# Patient Record
Sex: Female | Born: 1935 | Race: White | Hispanic: No | Smoking: Never smoker
Health system: Southern US, Community
[De-identification: ages and names within clinical notes are randomized; demographics above are authoritative.]

## PROBLEM LIST (undated history)

## (undated) DIAGNOSIS — F329 Major depressive disorder, single episode, unspecified: Secondary | ICD-10-CM

## (undated) DIAGNOSIS — R0602 Shortness of breath: Secondary | ICD-10-CM

## (undated) DIAGNOSIS — F3289 Other specified depressive episodes: Secondary | ICD-10-CM

## (undated) DIAGNOSIS — M533 Sacrococcygeal disorders, not elsewhere classified: Secondary | ICD-10-CM

## (undated) DIAGNOSIS — R55 Syncope and collapse: Secondary | ICD-10-CM

## (undated) DIAGNOSIS — I10 Essential (primary) hypertension: Secondary | ICD-10-CM

## (undated) DIAGNOSIS — J449 Chronic obstructive pulmonary disease, unspecified: Secondary | ICD-10-CM

## (undated) DIAGNOSIS — N183 Chronic kidney disease, stage 3 unspecified: Secondary | ICD-10-CM

## (undated) DIAGNOSIS — I2510 Atherosclerotic heart disease of native coronary artery without angina pectoris: Secondary | ICD-10-CM

## (undated) DIAGNOSIS — Y92129 Unspecified place in nursing home as the place of occurrence of the external cause: Secondary | ICD-10-CM

## (undated) DIAGNOSIS — R269 Unspecified abnormalities of gait and mobility: Secondary | ICD-10-CM

## (undated) DIAGNOSIS — F0390 Unspecified dementia without behavioral disturbance: Secondary | ICD-10-CM

## (undated) DIAGNOSIS — W19XXXA Unspecified fall, initial encounter: Secondary | ICD-10-CM

## (undated) DIAGNOSIS — F419 Anxiety disorder, unspecified: Secondary | ICD-10-CM

## (undated) DIAGNOSIS — E1142 Type 2 diabetes mellitus with diabetic polyneuropathy: Secondary | ICD-10-CM

## (undated) DIAGNOSIS — I219 Acute myocardial infarction, unspecified: Secondary | ICD-10-CM

## (undated) DIAGNOSIS — F0150 Vascular dementia without behavioral disturbance: Secondary | ICD-10-CM

## (undated) DIAGNOSIS — E785 Hyperlipidemia, unspecified: Secondary | ICD-10-CM

## (undated) DIAGNOSIS — I679 Cerebrovascular disease, unspecified: Secondary | ICD-10-CM

## (undated) DIAGNOSIS — G629 Polyneuropathy, unspecified: Secondary | ICD-10-CM

## (undated) DIAGNOSIS — I672 Cerebral atherosclerosis: Secondary | ICD-10-CM

## (undated) DIAGNOSIS — K219 Gastro-esophageal reflux disease without esophagitis: Secondary | ICD-10-CM

## (undated) DIAGNOSIS — Z950 Presence of cardiac pacemaker: Secondary | ICD-10-CM

## (undated) DIAGNOSIS — R410 Disorientation, unspecified: Secondary | ICD-10-CM

## (undated) DIAGNOSIS — I639 Cerebral infarction, unspecified: Secondary | ICD-10-CM

## (undated) DIAGNOSIS — E1121 Type 2 diabetes mellitus with diabetic nephropathy: Secondary | ICD-10-CM

## (undated) DIAGNOSIS — J810 Acute pulmonary edema: Secondary | ICD-10-CM

## (undated) DIAGNOSIS — R569 Unspecified convulsions: Secondary | ICD-10-CM

## (undated) DIAGNOSIS — E119 Type 2 diabetes mellitus without complications: Secondary | ICD-10-CM

## (undated) DIAGNOSIS — I447 Left bundle-branch block, unspecified: Secondary | ICD-10-CM

## (undated) HISTORY — DX: Sacrococcygeal disorders, not elsewhere classified: M53.3

## (undated) HISTORY — DX: Unspecified abnormalities of gait and mobility: R26.9

## (undated) HISTORY — DX: Unspecified dementia, unspecified severity, without behavioral disturbance, psychotic disturbance, mood disturbance, and anxiety: F03.90

## (undated) HISTORY — DX: Cerebrovascular disease, unspecified: I67.9

## (undated) HISTORY — PX: TUBAL LIGATION: SHX77

## (undated) HISTORY — PX: DILATION AND CURETTAGE OF UTERUS: SHX78

## (undated) HISTORY — PX: MASTOIDECTOMY: SHX711

## (undated) HISTORY — DX: Polyneuropathy, unspecified: G62.9

## (undated) HISTORY — DX: Major depressive disorder, single episode, unspecified: F32.9

## (undated) HISTORY — PX: APPENDECTOMY: SHX54

## (undated) HISTORY — PX: CHOLECYSTECTOMY: SHX55

## (undated) HISTORY — PX: EYE SURGERY: SHX253

## (undated) HISTORY — DX: Syncope and collapse: R55

## (undated) HISTORY — PX: IRIDECTOMY: SHX1848

## (undated) HISTORY — DX: Other specified depressive episodes: F32.89

## (undated) HISTORY — DX: Type 2 diabetes mellitus with diabetic polyneuropathy: E11.42

## (undated) HISTORY — DX: Cerebral atherosclerosis: I67.2

## (undated) HISTORY — PX: CATARACT EXTRACTION W/ INTRAOCULAR LENS  IMPLANT, BILATERAL: SHX1307

## (undated) HISTORY — PX: VAGINAL HYSTERECTOMY: SUR661

## (undated) HISTORY — DX: Essential (primary) hypertension: I10

## (undated) HISTORY — DX: Atherosclerotic heart disease of native coronary artery without angina pectoris: I25.10

## (undated) HISTORY — DX: Type 2 diabetes mellitus with diabetic nephropathy: E11.21

## (undated) HISTORY — PX: CARDIAC CATHETERIZATION: SHX172

## (undated) HISTORY — DX: Chronic kidney disease, stage 3 (moderate): N18.3

## (undated) HISTORY — PX: TONSILLECTOMY AND ADENOIDECTOMY: SUR1326

## (undated) HISTORY — DX: Chronic obstructive pulmonary disease, unspecified: J44.9

## (undated) HISTORY — DX: Hyperlipidemia, unspecified: E78.5

## (undated) HISTORY — DX: Chronic kidney disease, stage 3 unspecified: N18.30

---

## 1982-12-04 HISTORY — PX: TYMPANOMASTOIDECTOMY: SHX34

## 1998-05-04 ENCOUNTER — Other Ambulatory Visit: Admission: RE | Admit: 1998-05-04 | Discharge: 1998-05-04 | Payer: Self-pay | Admitting: Internal Medicine

## 1999-08-26 ENCOUNTER — Encounter: Payer: Self-pay | Admitting: Internal Medicine

## 1999-08-26 ENCOUNTER — Ambulatory Visit (HOSPITAL_COMMUNITY): Admission: RE | Admit: 1999-08-26 | Discharge: 1999-08-26 | Payer: Self-pay | Admitting: Internal Medicine

## 2000-02-23 ENCOUNTER — Encounter: Admission: RE | Admit: 2000-02-23 | Discharge: 2000-02-23 | Payer: Self-pay | Admitting: Internal Medicine

## 2000-02-23 ENCOUNTER — Encounter: Payer: Self-pay | Admitting: Internal Medicine

## 2000-03-19 ENCOUNTER — Ambulatory Visit (HOSPITAL_COMMUNITY): Admission: RE | Admit: 2000-03-19 | Discharge: 2000-03-19 | Payer: Self-pay | Admitting: Interventional Cardiology

## 2000-04-24 ENCOUNTER — Encounter (INDEPENDENT_AMBULATORY_CARE_PROVIDER_SITE_OTHER): Payer: Self-pay | Admitting: Specialist

## 2000-04-24 ENCOUNTER — Encounter: Payer: Self-pay | Admitting: Surgery

## 2000-04-25 ENCOUNTER — Inpatient Hospital Stay (HOSPITAL_COMMUNITY): Admission: RE | Admit: 2000-04-25 | Discharge: 2000-05-03 | Payer: Self-pay | Admitting: Surgery

## 2000-04-30 ENCOUNTER — Encounter: Payer: Self-pay | Admitting: Surgery

## 2000-05-01 ENCOUNTER — Encounter: Payer: Self-pay | Admitting: Surgery

## 2000-05-07 ENCOUNTER — Ambulatory Visit (HOSPITAL_COMMUNITY): Admission: RE | Admit: 2000-05-07 | Discharge: 2000-05-07 | Payer: Self-pay | Admitting: Surgery

## 2000-05-15 ENCOUNTER — Inpatient Hospital Stay (HOSPITAL_COMMUNITY): Admission: EM | Admit: 2000-05-15 | Discharge: 2000-05-21 | Payer: Self-pay | Admitting: Emergency Medicine

## 2000-05-15 ENCOUNTER — Encounter: Payer: Self-pay | Admitting: Emergency Medicine

## 2000-05-18 ENCOUNTER — Encounter: Payer: Self-pay | Admitting: Internal Medicine

## 2000-06-26 ENCOUNTER — Ambulatory Visit (HOSPITAL_COMMUNITY): Admission: RE | Admit: 2000-06-26 | Discharge: 2000-06-26 | Payer: Self-pay | Admitting: Surgery

## 2000-06-26 ENCOUNTER — Encounter: Payer: Self-pay | Admitting: Surgery

## 2000-08-15 ENCOUNTER — Ambulatory Visit (HOSPITAL_COMMUNITY): Admission: RE | Admit: 2000-08-15 | Discharge: 2000-08-15 | Payer: Self-pay | Admitting: *Deleted

## 2000-08-15 ENCOUNTER — Encounter: Payer: Self-pay | Admitting: *Deleted

## 2000-08-20 ENCOUNTER — Ambulatory Visit (HOSPITAL_COMMUNITY): Admission: RE | Admit: 2000-08-20 | Discharge: 2000-08-20 | Payer: Self-pay | Admitting: *Deleted

## 2000-09-26 ENCOUNTER — Encounter: Payer: Self-pay | Admitting: Surgery

## 2000-09-26 ENCOUNTER — Ambulatory Visit (HOSPITAL_COMMUNITY): Admission: AD | Admit: 2000-09-26 | Discharge: 2000-09-26 | Payer: Self-pay | Admitting: Surgery

## 2000-10-15 ENCOUNTER — Encounter: Payer: Self-pay | Admitting: Surgery

## 2000-10-15 ENCOUNTER — Ambulatory Visit (HOSPITAL_COMMUNITY): Admission: RE | Admit: 2000-10-15 | Discharge: 2000-10-15 | Payer: Self-pay | Admitting: Surgery

## 2000-12-27 ENCOUNTER — Encounter: Payer: Self-pay | Admitting: Surgery

## 2000-12-27 ENCOUNTER — Ambulatory Visit (HOSPITAL_COMMUNITY): Admission: RE | Admit: 2000-12-27 | Discharge: 2000-12-27 | Payer: Self-pay | Admitting: Surgery

## 2001-01-30 ENCOUNTER — Emergency Department (HOSPITAL_COMMUNITY): Admission: EM | Admit: 2001-01-30 | Discharge: 2001-01-30 | Payer: Self-pay | Admitting: Emergency Medicine

## 2001-01-30 ENCOUNTER — Encounter: Payer: Self-pay | Admitting: General Surgery

## 2001-01-30 ENCOUNTER — Ambulatory Visit (HOSPITAL_COMMUNITY): Admission: RE | Admit: 2001-01-30 | Discharge: 2001-01-30 | Payer: Self-pay | Admitting: General Surgery

## 2001-03-07 ENCOUNTER — Encounter: Payer: Self-pay | Admitting: Surgery

## 2001-03-07 ENCOUNTER — Ambulatory Visit (HOSPITAL_COMMUNITY): Admission: RE | Admit: 2001-03-07 | Discharge: 2001-03-07 | Payer: Self-pay | Admitting: Surgery

## 2001-04-19 ENCOUNTER — Ambulatory Visit (HOSPITAL_COMMUNITY): Admission: RE | Admit: 2001-04-19 | Discharge: 2001-04-19 | Payer: Self-pay | Admitting: Surgery

## 2001-04-19 ENCOUNTER — Encounter: Payer: Self-pay | Admitting: Surgery

## 2001-04-19 ENCOUNTER — Emergency Department (HOSPITAL_COMMUNITY): Admission: EM | Admit: 2001-04-19 | Discharge: 2001-04-19 | Payer: Self-pay | Admitting: Emergency Medicine

## 2001-08-21 ENCOUNTER — Emergency Department (HOSPITAL_COMMUNITY): Admission: EM | Admit: 2001-08-21 | Discharge: 2001-08-21 | Payer: Self-pay | Admitting: Emergency Medicine

## 2001-11-08 ENCOUNTER — Encounter: Admission: RE | Admit: 2001-11-08 | Discharge: 2001-11-08 | Payer: Self-pay | Admitting: Internal Medicine

## 2001-11-08 ENCOUNTER — Encounter: Payer: Self-pay | Admitting: Internal Medicine

## 2001-11-13 ENCOUNTER — Encounter: Payer: Self-pay | Admitting: Internal Medicine

## 2001-11-13 ENCOUNTER — Encounter: Admission: RE | Admit: 2001-11-13 | Discharge: 2001-11-13 | Payer: Self-pay | Admitting: Internal Medicine

## 2001-12-04 DIAGNOSIS — I2510 Atherosclerotic heart disease of native coronary artery without angina pectoris: Secondary | ICD-10-CM

## 2001-12-04 HISTORY — DX: Atherosclerotic heart disease of native coronary artery without angina pectoris: I25.10

## 2002-05-06 ENCOUNTER — Emergency Department (HOSPITAL_COMMUNITY): Admission: EM | Admit: 2002-05-06 | Discharge: 2002-05-06 | Payer: Self-pay | Admitting: *Deleted

## 2002-06-10 ENCOUNTER — Encounter: Admission: RE | Admit: 2002-06-10 | Discharge: 2002-06-10 | Payer: Self-pay | Admitting: Internal Medicine

## 2002-06-10 ENCOUNTER — Encounter: Payer: Self-pay | Admitting: Internal Medicine

## 2002-06-16 ENCOUNTER — Encounter: Payer: Self-pay | Admitting: Internal Medicine

## 2002-06-16 ENCOUNTER — Ambulatory Visit (HOSPITAL_COMMUNITY): Admission: RE | Admit: 2002-06-16 | Discharge: 2002-06-16 | Payer: Self-pay | Admitting: Internal Medicine

## 2002-10-06 ENCOUNTER — Encounter: Payer: Self-pay | Admitting: Emergency Medicine

## 2002-10-06 ENCOUNTER — Observation Stay (HOSPITAL_COMMUNITY): Admission: EM | Admit: 2002-10-06 | Discharge: 2002-10-09 | Payer: Self-pay | Admitting: Emergency Medicine

## 2003-06-27 ENCOUNTER — Encounter: Payer: Self-pay | Admitting: Emergency Medicine

## 2003-06-27 ENCOUNTER — Inpatient Hospital Stay (HOSPITAL_COMMUNITY): Admission: EM | Admit: 2003-06-27 | Discharge: 2003-06-29 | Payer: Self-pay | Admitting: Emergency Medicine

## 2003-06-29 ENCOUNTER — Encounter: Payer: Self-pay | Admitting: Interventional Cardiology

## 2004-02-26 ENCOUNTER — Emergency Department (HOSPITAL_COMMUNITY): Admission: AD | Admit: 2004-02-26 | Discharge: 2004-02-26 | Payer: Self-pay | Admitting: Family Medicine

## 2004-06-24 ENCOUNTER — Emergency Department (HOSPITAL_COMMUNITY): Admission: EM | Admit: 2004-06-24 | Discharge: 2004-06-24 | Payer: Self-pay | Admitting: Family Medicine

## 2004-09-08 ENCOUNTER — Encounter: Admission: RE | Admit: 2004-09-08 | Discharge: 2004-09-08 | Payer: Self-pay | Admitting: Internal Medicine

## 2005-01-27 ENCOUNTER — Inpatient Hospital Stay (HOSPITAL_COMMUNITY): Admission: RE | Admit: 2005-01-27 | Discharge: 2005-01-30 | Payer: Self-pay | Admitting: Orthopedic Surgery

## 2005-08-31 ENCOUNTER — Emergency Department (HOSPITAL_COMMUNITY): Admission: EM | Admit: 2005-08-31 | Discharge: 2005-08-31 | Payer: Self-pay | Admitting: Emergency Medicine

## 2005-10-24 ENCOUNTER — Encounter: Admission: RE | Admit: 2005-10-24 | Discharge: 2005-10-24 | Payer: Self-pay | Admitting: Internal Medicine

## 2006-03-26 ENCOUNTER — Emergency Department (HOSPITAL_COMMUNITY): Admission: EM | Admit: 2006-03-26 | Discharge: 2006-03-26 | Payer: Self-pay | Admitting: Emergency Medicine

## 2006-05-27 ENCOUNTER — Inpatient Hospital Stay (HOSPITAL_COMMUNITY): Admission: EM | Admit: 2006-05-27 | Discharge: 2006-05-29 | Payer: Self-pay | Admitting: Emergency Medicine

## 2006-05-28 ENCOUNTER — Encounter: Payer: Self-pay | Admitting: Vascular Surgery

## 2006-06-23 ENCOUNTER — Inpatient Hospital Stay (HOSPITAL_COMMUNITY): Admission: EM | Admit: 2006-06-23 | Discharge: 2006-06-28 | Payer: Self-pay | Admitting: Emergency Medicine

## 2006-11-14 ENCOUNTER — Encounter: Admission: RE | Admit: 2006-11-14 | Discharge: 2006-11-14 | Payer: Self-pay | Admitting: Internal Medicine

## 2007-04-14 ENCOUNTER — Emergency Department (HOSPITAL_COMMUNITY): Admission: EM | Admit: 2007-04-14 | Discharge: 2007-04-14 | Payer: Self-pay | Admitting: Emergency Medicine

## 2007-05-29 ENCOUNTER — Encounter: Admission: RE | Admit: 2007-05-29 | Discharge: 2007-05-29 | Payer: Self-pay | Admitting: Internal Medicine

## 2007-08-25 ENCOUNTER — Inpatient Hospital Stay (HOSPITAL_COMMUNITY): Admission: EM | Admit: 2007-08-25 | Discharge: 2007-09-01 | Payer: Self-pay | Admitting: Emergency Medicine

## 2007-11-02 ENCOUNTER — Emergency Department (HOSPITAL_COMMUNITY): Admission: EM | Admit: 2007-11-02 | Discharge: 2007-11-02 | Payer: Self-pay | Admitting: Family Medicine

## 2007-12-12 ENCOUNTER — Encounter: Admission: RE | Admit: 2007-12-12 | Discharge: 2007-12-12 | Payer: Self-pay | Admitting: Internal Medicine

## 2007-12-20 ENCOUNTER — Emergency Department (HOSPITAL_COMMUNITY): Admission: EM | Admit: 2007-12-20 | Discharge: 2007-12-20 | Payer: Self-pay | Admitting: Emergency Medicine

## 2008-08-09 ENCOUNTER — Observation Stay (HOSPITAL_COMMUNITY): Admission: EM | Admit: 2008-08-09 | Discharge: 2008-08-10 | Payer: Self-pay | Admitting: Emergency Medicine

## 2009-03-17 ENCOUNTER — Encounter: Admission: RE | Admit: 2009-03-17 | Discharge: 2009-03-17 | Payer: Self-pay | Admitting: Internal Medicine

## 2009-05-31 ENCOUNTER — Emergency Department (HOSPITAL_COMMUNITY): Admission: EM | Admit: 2009-05-31 | Discharge: 2009-05-31 | Payer: Self-pay | Admitting: Emergency Medicine

## 2009-09-03 ENCOUNTER — Encounter: Admission: RE | Admit: 2009-09-03 | Discharge: 2009-09-03 | Payer: Self-pay | Admitting: Internal Medicine

## 2010-05-21 ENCOUNTER — Emergency Department (HOSPITAL_COMMUNITY): Admission: EM | Admit: 2010-05-21 | Discharge: 2010-05-22 | Payer: Self-pay | Admitting: Emergency Medicine

## 2010-05-26 ENCOUNTER — Inpatient Hospital Stay (HOSPITAL_COMMUNITY): Admission: EM | Admit: 2010-05-26 | Discharge: 2010-05-27 | Payer: Self-pay | Admitting: Emergency Medicine

## 2010-05-26 ENCOUNTER — Ambulatory Visit: Payer: Self-pay | Admitting: Vascular Surgery

## 2010-05-26 ENCOUNTER — Ambulatory Visit: Payer: Self-pay | Admitting: Internal Medicine

## 2010-05-26 ENCOUNTER — Encounter (INDEPENDENT_AMBULATORY_CARE_PROVIDER_SITE_OTHER): Payer: Self-pay | Admitting: Internal Medicine

## 2010-07-01 ENCOUNTER — Emergency Department (HOSPITAL_COMMUNITY): Admission: EM | Admit: 2010-07-01 | Discharge: 2010-07-01 | Payer: Self-pay | Admitting: Emergency Medicine

## 2010-07-09 ENCOUNTER — Inpatient Hospital Stay (HOSPITAL_COMMUNITY): Admission: EM | Admit: 2010-07-09 | Discharge: 2010-07-14 | Payer: Self-pay | Admitting: Emergency Medicine

## 2010-08-25 ENCOUNTER — Emergency Department (HOSPITAL_COMMUNITY): Admission: EM | Admit: 2010-08-25 | Discharge: 2010-08-25 | Payer: Self-pay | Admitting: Emergency Medicine

## 2010-09-21 ENCOUNTER — Emergency Department (HOSPITAL_COMMUNITY): Admission: EM | Admit: 2010-09-21 | Discharge: 2010-09-21 | Payer: Self-pay | Admitting: Emergency Medicine

## 2011-01-23 ENCOUNTER — Emergency Department (HOSPITAL_COMMUNITY): Payer: Medicare Other

## 2011-01-23 ENCOUNTER — Inpatient Hospital Stay (HOSPITAL_COMMUNITY)
Admission: EM | Admit: 2011-01-23 | Discharge: 2011-01-25 | DRG: 312 | Disposition: A | Discharge status: Nursing Home (Includes ICF) | Payer: Medicare Other | Attending: Internal Medicine | Admitting: Internal Medicine

## 2011-01-23 DIAGNOSIS — I10 Essential (primary) hypertension: Secondary | ICD-10-CM | POA: Diagnosis present

## 2011-01-23 DIAGNOSIS — I2510 Atherosclerotic heart disease of native coronary artery without angina pectoris: Secondary | ICD-10-CM | POA: Diagnosis present

## 2011-01-23 DIAGNOSIS — G40909 Epilepsy, unspecified, not intractable, without status epilepticus: Secondary | ICD-10-CM | POA: Diagnosis present

## 2011-01-23 DIAGNOSIS — Z8673 Personal history of transient ischemic attack (TIA), and cerebral infarction without residual deficits: Secondary | ICD-10-CM

## 2011-01-23 DIAGNOSIS — E871 Hypo-osmolality and hyponatremia: Secondary | ICD-10-CM | POA: Diagnosis present

## 2011-01-23 DIAGNOSIS — E1169 Type 2 diabetes mellitus with other specified complication: Secondary | ICD-10-CM | POA: Diagnosis present

## 2011-01-23 DIAGNOSIS — R55 Syncope and collapse: Principal | ICD-10-CM | POA: Diagnosis present

## 2011-01-23 DIAGNOSIS — Z7902 Long term (current) use of antithrombotics/antiplatelets: Secondary | ICD-10-CM

## 2011-01-23 DIAGNOSIS — E785 Hyperlipidemia, unspecified: Secondary | ICD-10-CM | POA: Diagnosis present

## 2011-01-23 DIAGNOSIS — D649 Anemia, unspecified: Secondary | ICD-10-CM | POA: Diagnosis present

## 2011-01-23 DIAGNOSIS — Z7982 Long term (current) use of aspirin: Secondary | ICD-10-CM

## 2011-01-23 DIAGNOSIS — A498 Other bacterial infections of unspecified site: Secondary | ICD-10-CM | POA: Diagnosis present

## 2011-01-23 DIAGNOSIS — N390 Urinary tract infection, site not specified: Secondary | ICD-10-CM | POA: Diagnosis present

## 2011-01-23 LAB — CK TOTAL AND CKMB (NOT AT ARMC)
CK, MB: 5.6 ng/mL — ABNORMAL HIGH (ref 0.3–4.0)
Total CK: 96 U/L (ref 7–177)

## 2011-01-23 LAB — DIFFERENTIAL
Basophils Absolute: 0 10*3/uL (ref 0.0–0.1)
Basophils Relative: 0 % (ref 0–1)
Monocytes Absolute: 0.6 10*3/uL (ref 0.1–1.0)
Neutro Abs: 4.5 10*3/uL (ref 1.7–7.7)
Neutrophils Relative %: 77 % (ref 43–77)

## 2011-01-23 LAB — GLUCOSE, CAPILLARY: Glucose-Capillary: 152 mg/dL — ABNORMAL HIGH (ref 70–99)

## 2011-01-23 LAB — BASIC METABOLIC PANEL
Chloride: 97 mEq/L (ref 96–112)
Creatinine, Ser: 1.35 mg/dL — ABNORMAL HIGH (ref 0.4–1.2)
GFR calc Af Amer: 46 mL/min — ABNORMAL LOW (ref >60)
GFR calc non Af Amer: 38 mL/min — ABNORMAL LOW (ref >60)

## 2011-01-23 LAB — TROPONIN I
Troponin I: 0.02 ng/mL (ref 0.00–0.06)
Troponin I: 0.02 ng/mL (ref 0.00–0.06)

## 2011-01-23 LAB — CBC
Hemoglobin: 10 g/dL — ABNORMAL LOW (ref 12.0–15.0)
MCHC: 34 g/dL (ref 30.0–36.0)
RBC: 3.09 MIL/uL — ABNORMAL LOW (ref 3.87–5.11)

## 2011-01-23 LAB — URINALYSIS, ROUTINE W REFLEX MICROSCOPIC
Ketones, ur: NEGATIVE mg/dL
Nitrite: NEGATIVE
Protein, ur: NEGATIVE mg/dL
Urobilinogen, UA: 0.2 mg/dL (ref 0.0–1.0)
pH: 6.5 (ref 5.0–8.0)

## 2011-01-23 NOTE — H&P (Signed)
NAME:  [name] [name], [name] [name] NO.:  [id number]  MEDICAL RECORD NO.:  [id number]           PATIENT TYPE:  E  LOCATION:  [facility]                         FACILITY:  [facility]  PHYSICIAN:  Talmage Nap, MD  DATE OF BIRTH:  05-Jan-1928  DATE OF ADMISSION:  01/23/2011 DATE OF DISCHARGE:                             HISTORY & PHYSICAL   PRIMARY CARE PHYSICIAN:  Candyce Churn, MD  History obtainable from the patient.  CHIEF COMPLAINT:  "I passed out at the cafeteria and I do not know what happened".  The patient is an 77 year old Caucasian female with history of CVA, no neuro deficit, seizure disorder, and coronary artery disease  was brought to the emergency room from the assisted living facility because the patient was said to have passed out while eating at the cafeteria. According to the patient for unspecified number of days she had been feeling very weak and tired and today at the cafeteria at about 12 midday while eating she passed out.  She could not recall the event that happened prior to passing out and after passing out, the patient could not recall any event that happened.  She presently denied any headaches. She denied any blurry vision.  She denied any nausea or vomiting.  No fever.  No chills.  No rigor.  No chest pain or shortness of breath. She also denied any history of abdominal discomfort.  No diarrhea or hematochezia and subsequently the patient is been admitted for further evaluation and stabilization.  PAST MEDICAL HISTORY: 1. Positive for CVA, presently no neuro deficit. 2. Hypertension. 3. Seizure disorder. 4. Coronary artery disease. 5. Type 2 diabetes mellitus. 6. Dyslipidemia. 7. Gait disorder. 8. History of fall. 9. Anemia. 10.History of carbamazepine toxicity.  PAST SURGICAL HISTORY: 1. Hysterectomy. 2. Appendectomy. 3. Open cholecystectomy. 4. Bilateral cataract surgery. 5. Tonsillectomy. 6. Amputation of the left fifth digit  at the level of the PIP     secondary to trauma.  Preadmission meds without dosages include aspirin, Benicar, carbamazepine, glyburide, isosorbide mononitrate, omeprazole, Plavix, senna, simvastatin, vitamin D.  She has no known allergies.  SOCIAL HISTORY:  Negative for alcohol or tobacco use.  She is a resident of the assisted living facility.  FAMILY HISTORY:  Noncontributory.  REVIEW OF SYSTEMS:  The patient presently denies any history of headaches.  No blurred vision.  No nausea or vomiting.  No fever.  No chills.  No rigor.  No chest pain or shortness of breath.  No cough.  No abdominal discomfort.  No diarrhea or hematochezia.  No dysuria or hematuria.  No swelling of the lower extremities.  No intolerance to heat or cold and no neuropsychiatric disorder.  PHYSICAL EXAMINATION:  GENERAL:  Elderly lady not in any obvious respiratory distress, dehydrated. VITAL SIGNS:  Blood pressure is 169/71, pulse is 55, respiratory rate is 20, temperature is 98.6. HEENT:  Pupils are reactive to light and extraocular muscles are intact. NECK:  No jugular venous distention.  No carotid bruit.  No lymphadenopathy. CHEST:  Clear to auscultation. CARDIAC:  Heart sounds are 1 and 2. ABDOMEN:  Soft,  nontender.  Liver, spleen, kidney not palpable.  Bowel sounds are positive. EXTREMITIES:  No pedal edema. NEUROLOGIC:  Nonfocal. MUSCULOSKELETAL:  Arthritic changes in the knees and in the feet. NEUROPSYCHIATRIC:  Unremarkable. SKIN:  Decreased turgor.  LABORATORY DATA:  Initial complete blood count with differential showed WBC of 5.8, hemoglobin 10.0, hematocrit of 29.4, MCV of 95.1 with a platelet count of 186 with normal differentials.  Basic metabolic panel showed sodium of 131, potassium of 4.3, chloride of 97, bicarb of 23, glucose is 166, BUN is 23, creatinine is 1.35.  First set of cardiac markers troponin-I 0.02.  Urinalysis showed trace leukocyte esterase with negative nitrite.   Urine microscopy showed wbc 3-6 with many bacteria.  EKG showed sinus rhythm with a rate of 61, left axis deviation and nonspecific T-wave changes.  Imaging study done include CT of the head without contrast which showed no acute findings.  There is remote infarction and chronic microvascular ischemic changes.  There is also mild mucositis at the maxillary sinus.  Chest x-ray essentially normal.  ADMITTING IMPRESSION: 1. Syncope. 2. Dehydration/Hyponatremia. 3. Asymptomatic bacteriuria. 4. History of cerebrovascular accident, no neuro deficit. 5. Hypertension. 6. Seizure disorder. 7. Coronary artery disease. 8. Type 2 diabetes mellitus. 9. Dyslipidemia.  PLAN:  To admit the patient to a telemetry under the services of Dr. Marden Noble, Patsi Sears.  The patient will be slowly rehydrated with normal saline IV to go at a rate of 80 mL an hour.  She will be given Rocephin 1 g IV q.24 and she will also be on Tegretol 400 mg p.o. b.i.d. for her seizure.  She will also be given Zocor 20 mg p.o. daily for her dyslipidemia and other medication to be given to the patient will include Plavix 75 mg p.o. daily, aspirin 325 mg p.o. daily.  For the diabetes mellitus, she will be on glyburide 2.5 mg p.o. daily.  Accu- Cheks t.i.d. with a.c. and nightly with regular insulin sliding scale (moderate scale).  She also will be on Effexor 75 mg p.o. daily.  Blood pressure will be controlled with Benicar 20 mg p.o. daily and Imdur 60 mg p.o. daily.  She will be on Protonix 40 mg IV q.24 h. for GI prophylaxis and Lovenox 40 mg subcu q.24 h. for DVT prophylaxis. Further labs to be ordered on this patient will include cardiac enzymes q.6 x3, blood carbamazepine or Tegretol level in a.m., EEG, hemoglobin A1c today or in a.m.  CBC, CMP and magnesium will be repeated in a.m. The patient will be followed and evaluated by Dr. Marden Noble.     Talmage Nap, MD     CN/MEDQ  D:  01/23/2011  T:   01/23/2011  Job:  [id number]  Electronically Signed by Talmage Nap  on 01/23/2011 11:30:09 PM

## 2011-01-24 ENCOUNTER — Inpatient Hospital Stay (HOSPITAL_COMMUNITY): Payer: Medicare Other

## 2011-01-24 LAB — COMPREHENSIVE METABOLIC PANEL
ALT: 20 U/L (ref 0–35)
CO2: 27 mEq/L (ref 19–32)
Calcium: 8.7 mg/dL (ref 8.4–10.5)
Chloride: 100 mEq/L (ref 96–112)
Creatinine, Ser: 1.09 mg/dL (ref 0.4–1.2)
GFR calc non Af Amer: 48 mL/min — ABNORMAL LOW (ref >60)
Glucose, Bld: 106 mg/dL — ABNORMAL HIGH (ref 70–99)
Total Bilirubin: 0.4 mg/dL (ref 0.3–1.2)

## 2011-01-24 LAB — GLUCOSE, CAPILLARY
Glucose-Capillary: 101 mg/dL — ABNORMAL HIGH (ref 70–99)
Glucose-Capillary: 69 mg/dL — ABNORMAL LOW (ref 70–99)
Glucose-Capillary: 117 mg/dL — ABNORMAL HIGH (ref 70–99)
Glucose-Capillary: 96 mg/dL (ref 70–99)

## 2011-01-24 LAB — CARDIAC PANEL(CRET KIN+CKTOT+MB+TROPI)
CK, MB: 4.5 ng/mL — ABNORMAL HIGH (ref 0.3–4.0)
Relative Index: INVALID (ref 0.0–2.5)
Troponin I: 0.02 ng/mL (ref 0.00–0.06)
Troponin I: 0.03 ng/mL (ref 0.00–0.06)

## 2011-01-24 LAB — HEMOGLOBIN A1C
Hgb A1c MFr Bld: 6.2 % — ABNORMAL HIGH (ref <5.7)
Mean Plasma Glucose: 131 mg/dL — ABNORMAL HIGH (ref <117)

## 2011-01-24 LAB — MAGNESIUM: Magnesium: 1.9 mg/dL (ref 1.5–2.5)

## 2011-01-24 LAB — CBC
MCHC: 34.1 g/dL (ref 30.0–36.0)
RDW: 12.3 % (ref 11.5–15.5)

## 2011-01-25 LAB — GLUCOSE, CAPILLARY: Glucose-Capillary: 77 mg/dL (ref 70–99)

## 2011-01-25 LAB — URINE CULTURE
Colony Count: >=100000
Culture  Setup Time: 201202210147

## 2011-01-25 NOTE — Discharge Summary (Signed)
  NAME:  [name] [name], [name] [name] NO.:  [id number]  MEDICAL RECORD NO.:  [id number]           PATIENT TYPE:  I  LOCATION:  2006                         FACILITY:  [facility]  PHYSICIAN:  Candyce Churn, M.D.DATE OF BIRTH:  Apr 28, 1928  DATE OF ADMISSION:  01/23/2011 DATE OF DISCHARGE:                              DISCHARGE SUMMARY   ADDENDUM: Discharge number [phone] should be a priority discharge summary, so the patient can be discharged back to [facility] this afternoon, January 25, 2011.  She is in room 2006.     Candyce Churn, M.D.     RNG/MEDQ  D:  01/25/2011  T:  01/25/2011  Job:  [id number]  Electronically Signed by Marden Noble M.D. on 01/25/2011 08:20:27 PM

## 2011-01-26 LAB — CARBAMAZEPINE, FREE AND TOTAL
Carbamazepine Metabolite/: 1.8 ug/mL
Carbamazepine Metabolite: 2.1 ug/mL — ABNORMAL HIGH (ref 0.1–1.0)
Carbamazepine, Bound: 6.6 ug/mL
Carbamazepine, Free: 1.8 ug/mL (ref 1.0–3.0)

## 2011-01-26 LAB — GLUCOSE, CAPILLARY: Glucose-Capillary: 167 mg/dL — ABNORMAL HIGH (ref 70–99)

## 2011-01-26 NOTE — Discharge Summary (Signed)
NAME:  [name] [name], [name] [name] NO.:  [id number]  MEDICAL RECORD NO.:  [id number]           PATIENT TYPE:  I  LOCATION:  2006                         FACILITY:  [facility]  PHYSICIAN:  Candyce Churn, M.D.DATE OF BIRTH:  12-18-1927  DATE OF ADMISSION:  01/23/2011 DATE OF DISCHARGE:  01/25/2011                              DISCHARGE SUMMARY   DISCHARGE DIAGNOSES: 1. Syncope, question etiology.  Most likely secondary to hypoglycemia. 2. History of cerebrovascular accident. 3. Hypertension. 4. Seizure disorder. 5. History of coronary artery disease. 6. Type 2 diabetes mellitus. 7. Dyslipidemia. 8. Gait disorder. 9. Anemia. 10.History of carbamazepine toxicity. 11.Possible episodes of hypoglycemia. 12.Escherichia coli urinary tract infection.  DISCHARGE MEDICATIONS: 1. Aspirin 325 mg daily. 2. Tegretol 400 mg b.i.d. 3. Plavix 75 mg daily. 4. Imdur 60 mg daily. 5. Benicar 20 mg daily. 6. Zocor 20 mg daily. 7. Effexor 75 mg daily. 8. Ciprofloxacin 250 mg b.i.d. for 7 days for E. coli urinary tract     infection.  LABORATORY DATA:  Urine culture from January 23, 2011, revealed E. coli, pansensitive.  Sensitive to ceftriaxone, ciprofloxacin, and levofloxacin.  Cardiac panel x3 while admitted, within normal limits. Magnesium 1.9.  Labs from January 24, 2011, white count 4400, hemoglobin 9.4, platelet count 184,000.  Sodium 134, potassium 4.5, chloride 100, bicarb 27, glucose 106, BUN 21, creatinine 1.09, with normal liver functions.  Hemoglobin A1c was 6.2%.  Mean plasma glucose 131.  A urinalysis from January 23, 2011, was cloudy, but otherwise just a trace of leukocytes.  There were many bacteria, only 3 to 6 white cells.  HOSPITAL COURSE:  [name] [name] is a pleasant 77 year old female with multiple medical issues, who resides very happily at [facility] in assisted living.  She was at the cafeteria on the day of admission and passed out.  She had been  feeling weak and tired all day.  She denied any other associated symptoms such as shortness of breath or chest pain or fevers or chills.  She was brought to the [facility] Emergency Room and admitted for observation.  While hospitalized, she had no significant arrhythmias. She did have 1 episode of moderate hypoglycemia with CBG of 69, and she was treated with a 15-gram carbohydrate snack.  This occurred on February 21 at 11:25 a.m.  EEG formal results are pending.  She has had no seizure activity while hospitalized.  She is eating well and alert and conversive, and recognizes me at the time of discharge.  She is feeling well.  She does report feeling weak at times and she has stopped eating simple sweets, and she is quite likely becoming hypoglycemic.  Hypoglycemia with history of seizure disorder in combination could certainly lead to syncope.  PLAN:  We will discharge back to [facility] off glipizide and on her usual medications.  We will also treat urinary tract infection with 7 days of ciprofloxacin and plan to see her back in the office in 2 weeks.  CONDITION ON DISCHARGE:  Much improved.  TIME SPENT:  Conversing with the patient, reviewing chart, laboratories, and performing discharge  summary was 35 minutes.     Candyce Churn, M.D.     RNG/MEDQ  D:  01/25/2011  T:  01/25/2011  Job:  [id number]  Electronically Signed by Marden Noble M.D. on 01/26/2011 05:18:39 AM

## 2011-02-02 NOTE — Procedures (Signed)
EEG NUMBER:  [id number].  REFERRING PHYSICIAN:  Candyce Churn, MD  CLINICAL HISTORY:  An 77 year old patient being evaluated for syncopal episode versus seizure.  MEDICATION LISTED:  Plavix, Effexor, Benicar, Imdur, Tegretol, Zocor, and Rocephin.  There is a portable EEG recorded with the patient awake and drowsy using 17-channel machine and standard 10/20 electrode placement.  Background awake rhythm consists of 7-8 Hz theta which is of moderate amplitude synchronous reactive to eye-opening and closure.  No paroxysmal epileptiform activity, spikes or sharp waves are noted. Stages of drowsiness only are achieved and definite sleep stages are not seen.  Study is slightly suboptimal due to excessive muscle artifacts. EKG tracing reveals regular sinus rhythm.  Length of the recording is 21.5 minutes.  Hyperventilation not performed.  Photic stimulation is unremarkable.  IMPRESSION:  This EEG is abnormal due to presence of mild bihemispheric dysfunction, this is a nonspecific finding seen in a variety of toxic metabolic hypoxic and degenerative etiologies.  No definite epileptiform features were noted.     Pramod P. Pearlean Brownie, MD    BJY:NWGN D:  01/24/2011 17:03:49  T:  01/24/2011 21:21:34  Job #:  [id number]  cc:   Candyce Churn, M.D. Fax: [phone]

## 2011-02-09 ENCOUNTER — Ambulatory Visit (HOSPITAL_COMMUNITY)
Admission: EM | Admit: 2011-02-09 | Discharge: 2011-02-13 | Disposition: A | Discharge status: Nursing Home (Includes ICF) | Payer: Medicare Other | Attending: Internal Medicine | Admitting: Internal Medicine

## 2011-02-09 ENCOUNTER — Emergency Department (HOSPITAL_COMMUNITY): Payer: Medicare Other

## 2011-02-09 DIAGNOSIS — E119 Type 2 diabetes mellitus without complications: Secondary | ICD-10-CM | POA: Insufficient documentation

## 2011-02-09 DIAGNOSIS — G40802 Other epilepsy, not intractable, without status epilepticus: Secondary | ICD-10-CM | POA: Insufficient documentation

## 2011-02-09 DIAGNOSIS — R51 Headache: Secondary | ICD-10-CM | POA: Insufficient documentation

## 2011-02-09 DIAGNOSIS — E785 Hyperlipidemia, unspecified: Secondary | ICD-10-CM | POA: Insufficient documentation

## 2011-02-09 DIAGNOSIS — S0083XA Contusion of other part of head, initial encounter: Secondary | ICD-10-CM | POA: Insufficient documentation

## 2011-02-09 DIAGNOSIS — S0003XA Contusion of scalp, initial encounter: Secondary | ICD-10-CM | POA: Insufficient documentation

## 2011-02-09 DIAGNOSIS — R32 Unspecified urinary incontinence: Secondary | ICD-10-CM | POA: Insufficient documentation

## 2011-02-09 DIAGNOSIS — I5030 Unspecified diastolic (congestive) heart failure: Secondary | ICD-10-CM | POA: Insufficient documentation

## 2011-02-09 DIAGNOSIS — R296 Repeated falls: Secondary | ICD-10-CM | POA: Insufficient documentation

## 2011-02-09 DIAGNOSIS — Y998 Other external cause status: Secondary | ICD-10-CM | POA: Insufficient documentation

## 2011-02-09 DIAGNOSIS — I447 Left bundle-branch block, unspecified: Secondary | ICD-10-CM | POA: Insufficient documentation

## 2011-02-09 DIAGNOSIS — Z7982 Long term (current) use of aspirin: Secondary | ICD-10-CM | POA: Insufficient documentation

## 2011-02-09 DIAGNOSIS — I509 Heart failure, unspecified: Secondary | ICD-10-CM | POA: Insufficient documentation

## 2011-02-09 DIAGNOSIS — D649 Anemia, unspecified: Secondary | ICD-10-CM | POA: Insufficient documentation

## 2011-02-09 DIAGNOSIS — Y9210 Unspecified residential institution as the place of occurrence of the external cause: Secondary | ICD-10-CM | POA: Insufficient documentation

## 2011-02-09 DIAGNOSIS — Z8673 Personal history of transient ischemic attack (TIA), and cerebral infarction without residual deficits: Secondary | ICD-10-CM | POA: Insufficient documentation

## 2011-02-09 DIAGNOSIS — Z79899 Other long term (current) drug therapy: Secondary | ICD-10-CM | POA: Insufficient documentation

## 2011-02-09 LAB — CK TOTAL AND CKMB (NOT AT ARMC)
CK, MB: 9.3 ng/mL (ref 0.3–4.0)
Relative Index: 6.3 — ABNORMAL HIGH (ref 0.0–2.5)

## 2011-02-09 LAB — URINALYSIS, ROUTINE W REFLEX MICROSCOPIC
Glucose, UA: NEGATIVE mg/dL
Nitrite: NEGATIVE
Specific Gravity, Urine: 1.011 (ref 1.005–1.030)
pH: 7 (ref 5.0–8.0)

## 2011-02-09 LAB — GLUCOSE, CAPILLARY: Glucose-Capillary: 200 mg/dL — ABNORMAL HIGH (ref 70–99)

## 2011-02-09 LAB — POCT I-STAT, CHEM 8
BUN: 25 mg/dL — ABNORMAL HIGH (ref 6–23)
Calcium, Ion: 1.05 mmol/L — ABNORMAL LOW (ref 1.12–1.32)
Chloride: 101 mEq/L (ref 96–112)
Creatinine, Ser: 1.1 mg/dL (ref 0.4–1.2)
Glucose, Bld: 156 mg/dL — ABNORMAL HIGH (ref 70–99)
TCO2: 21 mmol/L (ref 0–100)

## 2011-02-09 LAB — CARDIAC PANEL(CRET KIN+CKTOT+MB+TROPI)
CK, MB: 6.4 ng/mL (ref 0.3–4.0)
Relative Index: 6.2 — ABNORMAL HIGH (ref 0.0–2.5)
Total CK: 103 U/L (ref 7–177)
Troponin I: 0.04 ng/mL (ref 0.00–0.06)

## 2011-02-09 LAB — CBC
Hemoglobin: 10.2 g/dL — ABNORMAL LOW (ref 12.0–15.0)
MCV: 94.6 fL (ref 78.0–100.0)
Platelets: 225 10*3/uL (ref 150–400)
RBC: 3.15 MIL/uL — ABNORMAL LOW (ref 3.87–5.11)
WBC: 8 10*3/uL (ref 4.0–10.5)

## 2011-02-09 LAB — POCT CARDIAC MARKERS: Troponin i, poc: <0.05 ng/mL (ref 0.00–0.09)

## 2011-02-09 LAB — DIFFERENTIAL
Eosinophils Absolute: 0.1 10*3/uL (ref 0.0–0.7)
Lymphs Abs: 1 10*3/uL (ref 0.7–4.0)
Neutro Abs: 6.2 10*3/uL (ref 1.7–7.7)
Neutrophils Relative %: 77 % (ref 43–77)

## 2011-02-10 LAB — BASIC METABOLIC PANEL
CO2: 24 mEq/L (ref 19–32)
Calcium: 8.4 mg/dL (ref 8.4–10.5)
Chloride: 102 mEq/L (ref 96–112)
Creatinine, Ser: 1.01 mg/dL (ref 0.4–1.2)
GFR calc Af Amer: >60 mL/min (ref >60)
Glucose, Bld: 151 mg/dL — ABNORMAL HIGH (ref 70–99)

## 2011-02-10 LAB — T4, FREE: Free T4: 0.78 ng/dL — ABNORMAL LOW (ref 0.80–1.80)

## 2011-02-10 LAB — CBC
HCT: 27.4 % — ABNORMAL LOW (ref 36.0–46.0)
Hemoglobin: 9.4 g/dL — ABNORMAL LOW (ref 12.0–15.0)
MCH: 32.6 pg (ref 26.0–34.0)
MCHC: 34.3 g/dL (ref 30.0–36.0)
MCV: 95.1 fL (ref 78.0–100.0)
RBC: 2.88 MIL/uL — ABNORMAL LOW (ref 3.87–5.11)

## 2011-02-10 LAB — CARDIAC PANEL(CRET KIN+CKTOT+MB+TROPI): Relative Index: INVALID (ref 0.0–2.5)

## 2011-02-10 LAB — DIFFERENTIAL
Basophils Relative: 0 % (ref 0–1)
Lymphocytes Relative: 25 % (ref 12–46)
Lymphs Abs: 1.6 10*3/uL (ref 0.7–4.0)
Monocytes Absolute: 0.7 10*3/uL (ref 0.1–1.0)
Monocytes Relative: 11 % (ref 3–12)
Neutro Abs: 4.1 10*3/uL (ref 1.7–7.7)
Neutrophils Relative %: 62 % (ref 43–77)

## 2011-02-10 LAB — LIPID PANEL
Cholesterol: 163 mg/dL (ref 0–200)
LDL Cholesterol: 86 mg/dL (ref 0–99)
Total CHOL/HDL Ratio: 2.7 RATIO
Triglycerides: 79 mg/dL (ref <150)

## 2011-02-10 LAB — VITAMIN B12: Vitamin B-12: 271 pg/mL (ref 211–911)

## 2011-02-10 LAB — T3, FREE: T3, Free: 2.3 pg/mL (ref 2.3–4.2)

## 2011-02-10 LAB — GLUCOSE, CAPILLARY: Glucose-Capillary: 149 mg/dL — ABNORMAL HIGH (ref 70–99)

## 2011-02-10 LAB — FERRITIN: Ferritin: 56 ng/mL (ref 10–291)

## 2011-02-11 LAB — GLUCOSE, CAPILLARY
Glucose-Capillary: 139 mg/dL — ABNORMAL HIGH (ref 70–99)
Glucose-Capillary: 166 mg/dL — ABNORMAL HIGH (ref 70–99)

## 2011-02-12 LAB — GLUCOSE, CAPILLARY
Glucose-Capillary: 150 mg/dL — ABNORMAL HIGH (ref 70–99)
Glucose-Capillary: 151 mg/dL — ABNORMAL HIGH (ref 70–99)

## 2011-02-13 LAB — GLUCOSE, CAPILLARY
Glucose-Capillary: 142 mg/dL — ABNORMAL HIGH (ref 70–99)
Glucose-Capillary: 139 mg/dL — ABNORMAL HIGH (ref 70–99)

## 2011-02-15 LAB — DIFFERENTIAL
Basophils Absolute: 0 10*3/uL (ref 0.0–0.1)
Basophils Relative: 0 % (ref 0–1)
Lymphocytes Relative: 23 % (ref 12–46)
Neutro Abs: 3.9 10*3/uL (ref 1.7–7.7)
Neutrophils Relative %: 69 % (ref 43–77)

## 2011-02-15 LAB — URINALYSIS, ROUTINE W REFLEX MICROSCOPIC
Bilirubin Urine: NEGATIVE
Hgb urine dipstick: NEGATIVE
Ketones, ur: NEGATIVE mg/dL
Protein, ur: NEGATIVE mg/dL
Urobilinogen, UA: 0.2 mg/dL (ref 0.0–1.0)

## 2011-02-15 LAB — POCT I-STAT, CHEM 8
BUN: 40 mg/dL — ABNORMAL HIGH (ref 6–23)
Chloride: 101 mEq/L (ref 96–112)
HCT: 31 % — ABNORMAL LOW (ref 36.0–46.0)
Potassium: 5.5 mEq/L — ABNORMAL HIGH (ref 3.5–5.1)

## 2011-02-15 LAB — COMPREHENSIVE METABOLIC PANEL
Albumin: 3.7 g/dL (ref 3.5–5.2)
Alkaline Phosphatase: 73 U/L (ref 39–117)
BUN: 24 mg/dL — ABNORMAL HIGH (ref 6–23)
Chloride: 98 mEq/L (ref 96–112)
Creatinine, Ser: 1.37 mg/dL — ABNORMAL HIGH (ref 0.4–1.2)
Glucose, Bld: 114 mg/dL — ABNORMAL HIGH (ref 70–99)
Potassium: 4.6 mEq/L (ref 3.5–5.1)
Total Bilirubin: 0.5 mg/dL (ref 0.3–1.2)
Total Protein: 6.9 g/dL (ref 6.0–8.3)

## 2011-02-15 LAB — POCT CARDIAC MARKERS
CKMB, poc: <1 ng/mL — ABNORMAL LOW (ref 1.0–8.0)
Myoglobin, poc: 144 ng/mL (ref 12–200)
Troponin i, poc: <0.05 ng/mL (ref 0.00–0.09)

## 2011-02-15 LAB — CBC
HCT: 29.9 % — ABNORMAL LOW (ref 36.0–46.0)
MCH: 33.2 pg (ref 26.0–34.0)
MCV: 95.5 fL (ref 78.0–100.0)
Platelets: 203 10*3/uL (ref 150–400)
RDW: 12.7 % (ref 11.5–15.5)
WBC: 5.6 10*3/uL (ref 4.0–10.5)

## 2011-02-15 LAB — URINE CULTURE: Culture: NO GROWTH

## 2011-02-15 LAB — GLUCOSE, CAPILLARY: Glucose-Capillary: 90 mg/dL (ref 70–99)

## 2011-02-17 LAB — BASIC METABOLIC PANEL
BUN: 28 mg/dL — ABNORMAL HIGH (ref 6–23)
Chloride: 107 mEq/L (ref 96–112)
Glucose, Bld: 82 mg/dL (ref 70–99)
Potassium: 4.4 mEq/L (ref 3.5–5.1)

## 2011-02-17 LAB — LIPID PANEL
HDL: 47 mg/dL (ref >39)
Total CHOL/HDL Ratio: 2.9 RATIO
Triglycerides: 103 mg/dL (ref <150)

## 2011-02-17 LAB — HEMOGLOBIN A1C: Mean Plasma Glucose: 143 mg/dL — ABNORMAL HIGH (ref <117)

## 2011-02-17 LAB — URINALYSIS, ROUTINE W REFLEX MICROSCOPIC
Bilirubin Urine: NEGATIVE
Nitrite: NEGATIVE
Specific Gravity, Urine: 1.02 (ref 1.005–1.030)
Urobilinogen, UA: 0.2 mg/dL (ref 0.0–1.0)

## 2011-02-17 LAB — CBC
HCT: 31.9 % — ABNORMAL LOW (ref 36.0–46.0)
Hemoglobin: 10 g/dL — ABNORMAL LOW (ref 12.0–15.0)
Hemoglobin: 10.3 g/dL — ABNORMAL LOW (ref 12.0–15.0)
MCH: 31.9 pg (ref 26.0–34.0)
MCHC: 32.3 g/dL (ref 30.0–36.0)
MCHC: 32.4 g/dL (ref 30.0–36.0)
MCV: 98.8 fL (ref 78.0–100.0)
RBC: 3.23 MIL/uL — ABNORMAL LOW (ref 3.87–5.11)
RDW: 12.3 % (ref 11.5–15.5)
WBC: 6.7 10*3/uL (ref 4.0–10.5)

## 2011-02-17 LAB — GLUCOSE, CAPILLARY
Glucose-Capillary: 100 mg/dL — ABNORMAL HIGH (ref 70–99)
Glucose-Capillary: 137 mg/dL — ABNORMAL HIGH (ref 70–99)
Glucose-Capillary: 148 mg/dL — ABNORMAL HIGH (ref 70–99)
Glucose-Capillary: 174 mg/dL — ABNORMAL HIGH (ref 70–99)
Glucose-Capillary: 76 mg/dL (ref 70–99)
Glucose-Capillary: 81 mg/dL (ref 70–99)
Glucose-Capillary: 87 mg/dL (ref 70–99)
Glucose-Capillary: 88 mg/dL (ref 70–99)
Glucose-Capillary: 90 mg/dL (ref 70–99)
Glucose-Capillary: 94 mg/dL (ref 70–99)

## 2011-02-17 LAB — URINE CULTURE: Culture  Setup Time: 201108071207

## 2011-02-17 LAB — DIFFERENTIAL
Basophils Relative: 1 % (ref 0–1)
Eosinophils Absolute: 0 10*3/uL (ref 0.0–0.7)
Eosinophils Absolute: 0.1 10*3/uL (ref 0.0–0.7)
Eosinophils Relative: 0 % (ref 0–5)
Lymphs Abs: 1.2 10*3/uL (ref 0.7–4.0)
Lymphs Abs: 1.8 10*3/uL (ref 0.7–4.0)
Monocytes Absolute: 0.5 10*3/uL (ref 0.1–1.0)
Monocytes Absolute: 0.6 10*3/uL (ref 0.1–1.0)
Monocytes Relative: 11 % (ref 3–12)
Monocytes Relative: 8 % (ref 3–12)

## 2011-02-17 LAB — CK TOTAL AND CKMB (NOT AT ARMC)
CK, MB: 7.7 ng/mL (ref 0.3–4.0)
Total CK: 149 U/L (ref 7–177)

## 2011-02-17 LAB — URINE MICROSCOPIC-ADD ON

## 2011-02-17 LAB — COMPREHENSIVE METABOLIC PANEL
ALT: 17 U/L (ref 0–35)
AST: 25 U/L (ref 0–37)
Calcium: 9 mg/dL (ref 8.4–10.5)
GFR calc Af Amer: 42 mL/min — ABNORMAL LOW (ref >60)
Sodium: 140 mEq/L (ref 135–145)
Total Protein: 7.3 g/dL (ref 6.0–8.3)

## 2011-02-17 LAB — IRON: Iron: 74 ug/dL (ref 42–135)

## 2011-02-17 LAB — TSH: TSH: 1.086 u[IU]/mL (ref 0.350–4.500)

## 2011-02-17 LAB — TROPONIN I: Troponin I: 0.05 ng/mL (ref 0.00–0.06)

## 2011-02-17 NOTE — Discharge Summary (Signed)
  NAME:  [name] [name], [name] [name] NO.:  [id number]  MEDICAL RECORD NO.:  [id number]           PATIENT TYPE:  O  LOCATION:  4503                         FACILITY:  [facility]  PHYSICIAN:  Thora Lance, M.D.  DATE OF BIRTH:  1928/10/04  DATE OF ADMISSION:  02/09/2011 DATE OF DISCHARGE:  02/11/2011                              DISCHARGE SUMMARY   REASON FOR ADMISSION:  An 77 year old female who was brought in to the hospital with an episode of having urinary urgency.  Unaware to the fact that she became incontinent, slipped and fell in the left side of her head.  In the ER, she had an EKG showing a new left bundle-branch block. She was admitted for cardiac observation.  There was concern if she may have had a syncopal episode.  SIGNIFICANT FINDINGS:  VITAL SIGNS:  Temperature 97.4, heart rate 75, respirations 16, blood pressure 143/77. LUNGS:  Clear. HEART:  Regular rate and rhythm without murmur, gallop or rub. ABDOMEN:  Soft, nontender. EXTREMITIES:  No edema.  LABORATORY DATA:  WBC 8, hemoglobin 10, platelet count 225.  Sodium 131, potassium 4.7, chloride 101, bicarbonate 25, creatinine 1.1, glucose 156.  Tegretol level 10.7.  MB 9.3, relatively intake 6.3, but the sample hemolyzed.  Troponin-I 0.01.  Negative UA.  CAT scan of the brain showed a left scalp hematoma, otherwise negative.  EKG showed a new left bundle-branch block, questionable depression in V5 and V6.  HOSPITAL COURSE:  The patient was admitted and seen by Dr. Katrinka Blazing of the Cardiology Service.  He recommend a serial cardiac monitors to rule out an acute coronary event, which was done and her cardiac markers remained negative.  Dr. Katrinka Blazing establishes her left bundle-branch block was chronic.  She had a brief episode of less than 9 beats of SVT on telemetry, but no significant arrhythmia.  Orthostatic vital signs were obtained and did suggest some orthostasis on February 10, 2011.  The patient's  hydrochlorothiazide was discontinued and her Benicar dose was decreased in half.  She will be seen Dr. Kevan Ny again in 1 week.  DISCHARGE DIAGNOSES: 1. Possible syncope. 2. Fall. 3. Left bundle-branch block. 4. Seizure disorder. 5. Diabetes mellitus. 6. Hyperlipidemia.  PROCEDURES:  None.  DISCHARGE MEDICATIONS: 1. Benicar 20 mg one half tablet a day. 2. Aspirin 325 mg once a day. 3. Carbamazepine 200 mg two tablets twice a day. 4. Imdur 30 mg two tablets daily. 5. Plavix 75 mg once a day. 6. Prilosec 20 mg one capsule once a day. 7. Senokot-S one tablet twice a day. 8. Simvastatin 20 mg one tablet at bedtime. 9. Vitamin D 1000 units twice a day.  DISPOSITION:  Discharged to assisted living.  Follow up in 2 weeks with Dr. Kevan Ny.  Activity as tolerated.          ______________________________ Thora Lance, M.D.     JJG/MEDQ  D:  02/11/2011  T:  02/11/2011  Job:  [id number]  cc:   Candyce Churn, M.D.  Electronically Signed by Kirby Funk M.D. on 02/17/2011 06:21:56 PM

## 2011-02-18 LAB — URINALYSIS, ROUTINE W REFLEX MICROSCOPIC
Glucose, UA: NEGATIVE mg/dL
Ketones, ur: NEGATIVE mg/dL
Nitrite: NEGATIVE
Protein, ur: NEGATIVE mg/dL

## 2011-02-18 LAB — BASIC METABOLIC PANEL
Calcium: 8 mg/dL — ABNORMAL LOW (ref 8.4–10.5)
Creatinine, Ser: 1.86 mg/dL — ABNORMAL HIGH (ref 0.4–1.2)
GFR calc Af Amer: 32 mL/min — ABNORMAL LOW (ref >60)
GFR calc non Af Amer: 26 mL/min — ABNORMAL LOW (ref >60)
Sodium: 133 mEq/L — ABNORMAL LOW (ref 135–145)

## 2011-02-18 LAB — DIFFERENTIAL
Basophils Relative: 0 % (ref 0–1)
Lymphs Abs: 1.3 10*3/uL (ref 0.7–4.0)
Monocytes Relative: 9 % (ref 3–12)
Neutro Abs: 4.3 10*3/uL (ref 1.7–7.7)
Neutrophils Relative %: 70 % (ref 43–77)

## 2011-02-18 LAB — CBC
Hemoglobin: 9 g/dL — ABNORMAL LOW (ref 12.0–15.0)
MCHC: 34.4 g/dL (ref 30.0–36.0)
Platelets: 190 10*3/uL (ref 150–400)
RBC: 2.65 MIL/uL — ABNORMAL LOW (ref 3.87–5.11)

## 2011-02-18 LAB — GLUCOSE, CAPILLARY: Glucose-Capillary: 175 mg/dL — ABNORMAL HIGH (ref 70–99)

## 2011-02-18 LAB — URINE MICROSCOPIC-ADD ON

## 2011-02-19 LAB — BASIC METABOLIC PANEL
BUN: 27 mg/dL — ABNORMAL HIGH (ref 6–23)
CO2: 24 mEq/L (ref 19–32)
Chloride: 101 mEq/L (ref 96–112)
Creatinine, Ser: 1.24 mg/dL — ABNORMAL HIGH (ref 0.4–1.2)
Potassium: 4.7 mEq/L (ref 3.5–5.1)

## 2011-02-19 LAB — CBC
HCT: 30.4 % — ABNORMAL LOW (ref 36.0–46.0)
HCT: 28.5 % — ABNORMAL LOW (ref 36.0–46.0)
Hemoglobin: 9.8 g/dL — ABNORMAL LOW (ref 12.0–15.0)
MCH: 32.8 pg (ref 26.0–34.0)
MCHC: 35.2 g/dL (ref 30.0–36.0)
MCV: 97.2 fL (ref 78.0–100.0)
MCV: 97.4 fL (ref 78.0–100.0)
Platelets: 175 10*3/uL (ref 150–400)
Platelets: 168 10*3/uL (ref 150–400)
RBC: 2.92 MIL/uL — ABNORMAL LOW (ref 3.87–5.11)
RBC: 3.02 MIL/uL — ABNORMAL LOW (ref 3.87–5.11)
WBC: 5.3 10*3/uL (ref 4.0–10.5)
WBC: 5.3 10*3/uL (ref 4.0–10.5)
WBC: 5.9 10*3/uL (ref 4.0–10.5)

## 2011-02-19 LAB — CARDIAC PANEL(CRET KIN+CKTOT+MB+TROPI)
CK, MB: 2.2 ng/mL (ref 0.3–4.0)
Relative Index: INVALID (ref 0.0–2.5)
Relative Index: INVALID (ref 0.0–2.5)
Total CK: 63 U/L (ref 7–177)
Total CK: 67 U/L (ref 7–177)
Troponin I: 0.05 ng/mL (ref 0.00–0.06)

## 2011-02-19 LAB — URINE CULTURE: Colony Count: >=100000

## 2011-02-19 LAB — POCT CARDIAC MARKERS: Troponin i, poc: <0.05 ng/mL (ref 0.00–0.09)

## 2011-02-19 LAB — URINALYSIS, ROUTINE W REFLEX MICROSCOPIC
Bilirubin Urine: NEGATIVE
Bilirubin Urine: NEGATIVE
Glucose, UA: NEGATIVE mg/dL
Hgb urine dipstick: NEGATIVE
Ketones, ur: NEGATIVE mg/dL
Nitrite: NEGATIVE
Nitrite: POSITIVE — AB
Specific Gravity, Urine: 1.014 (ref 1.005–1.030)
Specific Gravity, Urine: 1.011 (ref 1.005–1.030)
Urobilinogen, UA: 0.2 mg/dL (ref 0.0–1.0)
pH: 5.5 (ref 5.0–8.0)

## 2011-02-19 LAB — POCT I-STAT, CHEM 8
BUN: 30 mg/dL — ABNORMAL HIGH (ref 6–23)
Calcium, Ion: 1.11 mmol/L — ABNORMAL LOW (ref 1.12–1.32)
Chloride: 102 mEq/L (ref 96–112)
Creatinine, Ser: 1.4 mg/dL — ABNORMAL HIGH (ref 0.4–1.2)
Glucose, Bld: 129 mg/dL — ABNORMAL HIGH (ref 70–99)
TCO2: 22 mmol/L (ref 0–100)

## 2011-02-19 LAB — DIFFERENTIAL
Basophils Relative: 1 % (ref 0–1)
Eosinophils Absolute: 0.1 10*3/uL (ref 0.0–0.7)
Eosinophils Absolute: 0.1 10*3/uL (ref 0.0–0.7)
Eosinophils Relative: 2 % (ref 0–5)
Eosinophils Relative: 1 % (ref 0–5)
Lymphocytes Relative: 19 % (ref 12–46)
Lymphs Abs: 1.8 10*3/uL (ref 0.7–4.0)
Lymphs Abs: 1.1 10*3/uL (ref 0.7–4.0)
Monocytes Absolute: 0.6 10*3/uL (ref 0.1–1.0)
Monocytes Relative: 8 % (ref 3–12)
Neutrophils Relative %: 55 % (ref 43–77)

## 2011-02-19 LAB — COMPREHENSIVE METABOLIC PANEL
ALT: 14 U/L (ref 0–35)
AST: 19 U/L (ref 0–37)
Albumin: 3.4 g/dL — ABNORMAL LOW (ref 3.5–5.2)
Alkaline Phosphatase: 85 U/L (ref 39–117)
CO2: 25 mEq/L (ref 19–32)
Chloride: 100 mEq/L (ref 96–112)
GFR calc Af Amer: 57 mL/min — ABNORMAL LOW (ref >60)
GFR calc non Af Amer: 47 mL/min — ABNORMAL LOW (ref >60)
Potassium: 4.6 mEq/L (ref 3.5–5.1)
Total Bilirubin: 0.6 mg/dL (ref 0.3–1.2)

## 2011-02-19 LAB — URINE MICROSCOPIC-ADD ON

## 2011-02-19 LAB — FOLATE: Folate: 17.3 ng/mL

## 2011-02-19 LAB — GLUCOSE, CAPILLARY

## 2011-02-19 LAB — MAGNESIUM: Magnesium: 1.9 mg/dL (ref 1.5–2.5)

## 2011-02-19 LAB — PHOSPHORUS: Phosphorus: 3.6 mg/dL (ref 2.3–4.6)

## 2011-02-19 LAB — HEMOGLOBIN A1C
Hgb A1c MFr Bld: 6.8 % — ABNORMAL HIGH (ref <5.7)
Mean Plasma Glucose: 148 mg/dL — ABNORMAL HIGH (ref <117)

## 2011-02-19 LAB — TSH: TSH: 2.461 u[IU]/mL (ref 0.350–4.500)

## 2011-02-19 LAB — HEMOCCULT GUIAC POC 1CARD (OFFICE): Fecal Occult Bld: NEGATIVE

## 2011-03-13 LAB — DIFFERENTIAL
Basophils Absolute: 0 10*3/uL (ref 0.0–0.1)
Basophils Relative: 0 % (ref 0–1)
Eosinophils Relative: 0 % (ref 0–5)
Monocytes Absolute: 0.6 10*3/uL (ref 0.1–1.0)
Neutro Abs: 4 10*3/uL (ref 1.7–7.7)

## 2011-03-13 LAB — CBC
HCT: 34 % — ABNORMAL LOW (ref 36.0–46.0)
Platelets: 214 10*3/uL (ref 150–400)
RBC: 3.54 MIL/uL — ABNORMAL LOW (ref 3.87–5.11)
WBC: 6.1 10*3/uL (ref 4.0–10.5)

## 2011-03-13 LAB — COMPREHENSIVE METABOLIC PANEL
AST: 27 U/L (ref 0–37)
Albumin: 4.2 g/dL (ref 3.5–5.2)
Alkaline Phosphatase: 122 U/L — ABNORMAL HIGH (ref 39–117)
BUN: 24 mg/dL — ABNORMAL HIGH (ref 6–23)
Chloride: 103 mEq/L (ref 96–112)
Potassium: 4.1 mEq/L (ref 3.5–5.1)
Total Bilirubin: 0.6 mg/dL (ref 0.3–1.2)

## 2011-03-17 NOTE — H&P (Signed)
NAME:  [name] [name], [name] [name] NO.:  [id number]  MEDICAL RECORD NO.:  [id number]           PATIENT TYPE:  E  LOCATION:  [facility]                         FACILITY:  [facility]  PHYSICIAN:  Valetta Close, M.D.   DATE OF BIRTH:  [birth year]  DATE OF ADMISSION:  02/09/2011 DATE OF DISCHARGE:                             HISTORY & PHYSICAL   PRIMARY CARE PHYSICIAN:  Candyce Churn, MD  PRIMARY CARDIOLOGIST:  Dr. Garnette Scheuermann.  CHIEF COMPLAINT:  Fall and EKG with new left bundle-branch block.  HISTORY OF PRESENT ILLNESS:  This is a 77 year old female with a history of multiple falls who has been worked up in the past.  In February she had a fall secondary to a syncopal episode, had a urinary tract infection at that time and was taken off her glipizide.  She was doing well at her living facility; however, she always has urinary urgency and on her way to the bathroom she became incontinent, slipped on the urine, fell and hit the left side of her head.  Other than a little bit of a headache that she rates as 2/10 she feels fine.  However in the ER she had an EKG that shows a new left bundle-branch block.  She also has a question of mild depression in lead V5 and V6 which does appear new and she has an elevated CK-MB and a relative index although the sample was hemolyzed.  For this we are admitting her for cardiac observation.  She currently resides at the [facility], room 2006.  PAST MEDICAL HISTORY: 1. Syncope, CVA, seizures with global nonspecific findings on EEG     February 21. 2. Diabetes with a hemoglobin A1c of 6.2. 3. A question of episodes of hypoglycemia for which her glipizide was     stopped. 4. Hyperlipidemia. 5. Anemia. 6. History of urinary tract infection on last admission for she was     treated with Cipro. 7. Appendectomy. 8. Cholecystectomy. 9. Hysterectomy.  Again she was at [facility], room 2006.   No alcohol, no tobacco, no drugs.  She is a full code.  REVIEW OF SYSTEMS:  Otherwise negative.  FAMILY HISTORY:  Noncontributory.  MEDICATIONS: 1. Carbamazepine 200 mg by mouth 2 tablets twice a day. 2. Benicar HCT 20/12.5 1 tablet by mouth once a day. 3. Vitamin D3 1000 units by mouth twice a day. 4. Simvastatin 20 mg by mouth once a day. 5. Senokot-S 1 tablet by mouth twice a day. 6. Omeprazole 20 mg by mouth once a day before breakfast. 7. Plavix 75 mg by mouth once a day. 8. Imdur 60 mg by mouth once a day. 9. Aspirin 325 mg by mouth once a day.  PHYSICAL EXAMINATION:  VITAL SIGNS:  Temperature 97.4, pulse 75, respiratory rate 16, blood pressure 143/77, O2 sat 100% on room air. She appears her stated age and is in no apparent distress. HEENT:  She does have a hematoma on her left forehead.  Pupils equal, round, reactive to light and accommodating.  No scleral icterus. LUNGS:  Clear to auscultation bilaterally.  CARDIAC:  Regular rate and rhythm.  S1, S2.  Question of soft murmur but only a very soft murmur. ABDOMEN:  Soft.  No tenderness, rebound, or guarding. EXTREMITIES:  No edema. NEURO:  She is alert and oriented x3.  Exam otherwise nonfocal.  She has a CT of her head that showed the left scalp hematoma, otherwise negative.  An EKG shows a new left bundle-branch block, question of depression in V5 and V6.  LABORATORY DATA:  White count 8, hemoglobin 10, hematocrit 30, platelets 225.  Sodium 131, potassium 4.7, chloride 101, bicarb 25, creatinine 1.1, glucose 156, Tegretol level 10.7 and MB is 9.3 with a relative index of 6.3, but the sample is hemolyzed.  CK 147, troponin 0.01.  Her MCV is 95.  She has a negative UA.  ASSESSMENT AND PLAN: 1. Fall.  This really seems mechanical in nature.  I would continue     with home meds.  I will check orthostatics when she arrives on the     floor and in the morning.  She may need a medication to help her     with detrusor  instability; however any parasympathetic might cause     her gait to be worse so I would do that with caution.  I will not     start any new medication at this time.  I will get a physical     therapy eval. 2. New left bundle-branch block.  She does have a 2-D echo in the past     that shows 2/4 diastolic dysfunction but no aortic stenosis that     will cause syncope.  She is having arrhythmias, that certainly     could cause a syncopal event, not necessary this fall.  I will     cycle enzymes.  I will consult Dr. Katrinka Blazing.  I will continue her     aspirin, her Plavix, her statin, her ARB.  I will hold off on beta-     blocker given that she was reported to have bradycardia yesterday.     Again, question if she had cardiac ischemia yesterday, but again     her first set of enzymes was negative, but I will continue to cycle     these x24 hours and admit her to a telemetry bed. 3. Anemia.  She has had this in the past.  I will check a B12, RBC,     folate, ferritin, and a TSH. 4. Diastolic congestive heart failure.  It is stable.  I will provide     education only at this time.  There is no sign that she has volume     overload. 5. Seizure disorder.  Her Tegretol level is at goal. 6. Recent urinary tract infection.  Again, we checked a UA, it was     negative.  I will hold off on antibiotics at this time.  Dr. Garnette Scheuermann has been consulted.     Valetta Close, M.D.     JC/MEDQ  D:  02/09/2011  T:  02/09/2011  Job:  [id number]  cc:   Candyce Churn, M.D. Hank Dr. Katrinka Blazing  Electronically Signed by Valetta Close M.D. on 03/17/2011 11:33:20 AM

## 2011-03-24 NOTE — Consult Note (Signed)
NAME:  [name] [name], [name] [name] NO.:  [id number]  MEDICAL RECORD NO.:  [id number]           PATIENT TYPE:  O  LOCATION:  2008                         FACILITY:  [facility]  PHYSICIAN:  Lyn Records, M.D.   DATE OF BIRTH:  July 01, 1928  DATE OF CONSULTATION:  02/09/2011 DATE OF DISCHARGE:                                CONSULTATION   REASON FOR CONSULTATION:  "New" left bundle-branch block, history of syncope, elevated CK-MB with normal troponins.  CONCLUSIONS: 1. The patient's left bundle-branch block is new dating back at least     to January 2011, based upon hospital chart review. 2. History of abnormal Cardiolite studies x2 at [facility] in     2009. 3. Elevated CK-MB but normal troponins.  No evidence for acute     coronary syndrome. 4. History of seizure disorder. 5. History of cerebrovascular accident. 6. Diabetes mellitus. 7. Hyperlipidemia. 8. Seizure disorder.  RECOMMENDATIONS: 1. __________. 2. Serial cardiac markers to rule out ACS. 3. Consider a loop recorder if the patient has recurrent syncope.  I     would not resort to this at the current time as the patient's     history is not compatible with syncope, but perhaps more compatible     with slipping and falling.  Her story, however, changes and may not     be very accurate. 4. Conservative approach for the time being unless there is evidence     of higher grade atrioventricular block or tachyarrhythmias that     would suggest a cardiac source of syncope.  COMMENTS:  The patient is an 22 and followed by Dr. Johnella Moloney.  She was admitted to the hospital after falling in the bathroom.  According to the admission history and physical by Dr. Noel Gerold, the patient slept on urine after becoming incontinent.  In speaking with the patient, she was headed to the bathroom and the next thing she knew she was on the floor. She feels she may have passed out.  She also references of the episodes of  "fainting" that Dr. Kevan Ny "knows about."  She had no chest pain.  She denied palpitations.  There is no history of malignant arrhythmia.  She gives a history of coronary artery disease.  I am not sure what the source of this diagnosis is but I was able to identify 2 Cardiolite studies in 2009 that were suspicious for ischemia although they were abnormal and a territory that could also be influenced by diaphragm attenuation.  She denies tobacco use.  She lives in assisted living. She has no family nearby.  She has no reported allergies.  PREVIOUS SURGERIES: 1. Cholecystectomy. 2. Cataracts. 3. Tonsillectomy. 4. Amputation of left fifth finger 5. Hysterectomy.  OBJECTIVE:  VITAL SIGNS:  Blood pressure 140/70, heart rate 60.  Monitor reveals sinus rhythm with occasional SVT up to 8 beats are noted.  A concern about possible ventricular tachycardia was unfounded. NECK:  Neck veins are not distended. LUNGS:  Clear. CARDIAC:  Reveals an S4, was otherwise unremarkable. ABDOMEN:  Soft. EXTREMITIES:  Revealed no edema.  LABORATORY  DATA:  Demonstrates hemoglobin of 10.2, potassium of 4.7, creatinine of 1.1, BUN of 25.  CK-MB is 6.4.  Troponin is 0.04.  No BNP is available.  Chest x-ray demonstrates no acute abnormality in February 2012, and has not been repeated on this admission.  I reviewed office notes from Dr. Kevan Ny.  DISCUSSION:  The diagnosis of coronary disease is unclear.  Perhaps this is based on 2 prior nuclear studies from 2009.  The LV function by echo in 2011 at [facility] demonstrated an EF of 45-50%.  No obvious regional wall motion abnormality was seen.  This admission is not a cardiac admission.  Dr. Noel Gerold felt that her left bundle was new.  It has been present since at least January 2011.  I would keep her on the monitor for as long as she needs to be in the hospital but if no significant arrhythmias especially bradyarrhythmias turn up, I do not believe further  evaluation is prudent at this time.  The enzyme pattern is not consistent with ACS.  I do not believe repeat coronary disease evaluation is necessary in the absence of chest pain or shortness of breath.  I would check a BNP.     Lyn Records, M.D.     HWS/MEDQ  D:  02/09/2011  T:  02/10/2011  Job:  [id number]  Electronically Signed by Verdis Prime M.D. on 03/24/2011 04:53:14 PM

## 2011-04-18 NOTE — Consult Note (Signed)
[name], HUSK NO.:  [id number]   MEDICAL RECORD NO.:  [id number]          PATIENT TYPE:  INP   LOCATION:  3728                         FACILITY:  [facility]   PHYSICIAN:  Antonietta Breach, M.D.  DATE OF BIRTH:  07-Apr-1928   DATE OF CONSULTATION:  08/27/2007  DATE OF DISCHARGE:                                 CONSULTATION   REQUESTING PHYSICIAN:  [name] [name], M.D.   REASON FOR CONSULTATION:  Mental status changes, assess capacity.   HISTORY OF PRESENT ILLNESS:  [name] is a 77 year old female  admitted to the [facility] on August 25, 2007, due to a fall and  mental status changes.   The patient was found by her neighbors after a fall.  She has had a  number of falls recently.  She does not have intact memory ability.  It  has been noted by the general medical physician and staff that the  patient has had difficulty with orientation, judgment and memory.  The  patient does have a history of depression.  However, she describes  normal interests and hope.  Her energy is mildly decreased and her  concentration is mildly decreased.  She does not have any hallucinations  or delusions.   It appears that the onset of her of confusion has been within the past 4  weeks.   PAST PSYCHIATRIC HISTORY:  On review of the past medical record there is  no mention of prior episodes of memory and cognitive impairment.  However, in May 2006, depression is mentioned and the patient at that  time was on Zoloft 100 mg daily.  Also, in June 2007 the patient was on  Effexor 75 mg daily.   FAMILY PSYCHIATRIC HISTORY:  None known.   SOCIAL HISTORY:  The patient has no history of doing alcohol or illegal  drugs.  She is widowed after being divorced in 1984.  Children:  Five.  One of the patient's children lives in [place], the patient's daughter.  The patient has been stating that this daughter will take care of her  when she moves back home.  However, the social  worker has reported that  this is not the case.   Occupation:  Retired.   PAST MEDICAL HISTORY:  1. History of prior temporary ischemic attacks and bilateral      cerebrovascular accidents involving occipital lobes as well as left      temporal and right parietal region.  2. Hypertension.  3. Hypertriglyceridemia.  4. Type 2 diabetes mellitus.  5. Coronary artery disease.  6. Possible seizure disorder.   SURGICAL HISTORY:  1. Cholecystectomy.  2. Hysterectomy.  3. Appendectomy.  4. Cataract surgery.  5. Iridectomy.  6. Left and right mastoidectomy.   MEDICATIONS:  The MAR is reviewed.  The patient is not on any  psychotropic medication other than Tegretol, which is being prescribed  for her seizure prevention.  She is on 200 mg q.a.m. and 4 mg q.h.s.   She has no known drug allergies.   LABORATORY DATA:  Tegretol level 4.7.  Sodium 140,  potassium 3.9,  chloride 109, bicarb 27, BUN 20, creatinine 0.9, glucose 138, calcium  8.7.  WBC 4.2, hemoglobin 11.2, platelet count 206.  SGOT 20, SGPT 15,  albumin 3.6.  Hemoglobin A1c elevated at 7.2.  B12 within normal limits.  TSH within normal limits.  Folate within normal limits.  RPR  nonreactive.  Head CT without contrast showed chronic advanced ischemic  sequelae without acute findings.  However, an MRI of the brain showed an  acute infarct in the right anterior cingulate gyrus.   REVIEW OF SYSTEMS:  CONSTITUTIONAL:  Afebrile.  No weight loss.  HEAD:  No additional trauma.  EYES:  No visual changes.  EARS:  No hearing  impairment.  NOSE:  No rhinorrhea.  MOUTH/THROAT:  No sore throat.  NEUROLOGIC:  No focal motor or sensory deficits.  PSYCHIATRIC:  As  above.  CARDIOVASCULAR:  No chest pain, palpitations.  RESPIRATORY:  No  coughing or wheezing.  GASTROINTESTINAL:  No nausea, vomiting, diarrhea.  GENITOURINARY:  No dysuria.  SKIN:  Unremarkable.  ENDOCRINE/METABOLIC:  No heat or cold intolerance.  MUSCULOSKELETAL:  No  deformities.  HEMATOLOGIC/LYMPHATIC:  Mild anemia.   EXAMINATION:  VITAL SIGNS:  Temperature 98.0, pulse 60, respiratory rate  18, blood pressure 162/70, O2 saturation on room air 98%.  GENERAL APPEARANCE:  [name] is an elderly female lying in a  supine position in her hospital bed with no abnormal involuntary  movements.  She is well-groomed.  OTHER MENTAL STATUS EXAM:  [name] is alert.  Her attention span is  mildly decreased due to her short-term memory impairment.  Her  concentration is mildly decreased.  On orientation testing she does not  know the year or the month or the day of the month or the day of the  week.  She does know her place and person.  On memory testing, 3/3  immediate, 0/3 at 3 minutes.  Fund of knowledge and intelligence are  below that of her estimated premorbid baseline.  Her speech does involve  normal rate and prosody without dysarthria.  Thought process is  involving partial confabulation.  There are no looseness of  associations.  The patient's language comprehension appears to be 80%  intact.  Her expression does involve partial confabulation.  Thought  content:  No thoughts of harming herself, no thoughts of harming others,  no delusions, no hallucinations.  Affect is slightly anxious, mood is  within normal limits.  Insight is poor, judgment is impaired.  The  patient does not provide knowledge of her general medical problems.   ASSESSMENT:  AXIS I:  1. Code 294.9, unspecified persistent mental disorder not otherwise      specified.  This appears to be dementia due to cerebrovascular      disease.  2. Code 293.83, mood disorder not otherwise specified, depressed, in      partial remission.  3. Rule out 296.35 major depressive disorder, recurrent, in partial      remission.  AXIS II:  None.  AXIS III:  See past medical history.  AXIS IV:  General medical, primary support group.  AXIS V:  Global assessment of functioning 30.   [name]  demonstrates critical impairments in memory as well as  judgment.  She cannot appreciate her general medical and constitutional  disabilities and the supportive environment required.  She does not have  the capacity for informed consent regarding discharge environment.   RECOMMENDATIONS:  1. Would continue to monitor the patient's  mood for the need to      restart an antidepressant.  2. There is some support in the literature for utilizing      acetylcholinesterase inhibitors for memory impairment in      cerebrovascular dementia, and Aricept starting at 5 mg daily could      be considered if cleared by neurology.   However, if this patient's demented status is acute after this recent  infarct, it is possible that she might regain cognitive and memory  ability.      Antonietta Breach, M.D.  Electronically Signed     JW/MEDQ  D:  08/27/2007  T:  08/28/2007  Job:  [id number]

## 2011-04-18 NOTE — Discharge Summary (Signed)
NAME:  [name] [name], [name] [name] NO.:  [id number]   MEDICAL RECORD NO.:  [id number]          PATIENT TYPE:  INP   LOCATION:  3728                         FACILITY:  [facility]   PHYSICIAN:  [name] [name], M.D.DATE OF BIRTH:  [birth year]   DATE OF ADMISSION:  08/25/2007  DATE OF DISCHARGE:                               DISCHARGE SUMMARY   DISCHARGE DIAGNOSES:  1. Multi-infarct dementia with inability to safely care for herself,      requiring assisted living level of care.  2. A possible seizure just prior to admission with a fall at home.  3. Elevated Tegretol level with new reduction in dose.  4. Type 2 diabetes mellitus.  5. Hypertriglyceridemia.  6. Depression.  7. Coronary artery disease.  8. A small air-fluid level noted on MRI and CT, suggesting right      maxillary sinusitis.  Asymptomatic.  9. Mild dystaxia secondary to prior history of central nervous system      infarcts.  She does have multiple cerebellar infarcts, old      cerebellar infarcts as well.   DISCHARGE MEDICATIONS:  1. Aspirin 81 mg a day.  2. Plavix 75 mg daily.  3. Protonix 40 mg daily.  4. Simvastatin 20 mg daily.  5. Tegretol 400 mg at bedtime and 200 mg in the morning.  6. Senokot-S one p.o. b.i.d.   CONSULTATION:  [name] P. Pearlean Brownie, MD, August 26, 2007.   HOSPITAL COURSE:  [name] [name] is a pleasant 77 year old female who had an  unwitnessed fall at home on August 25, 2007.  She has a history of  multiple prior strokes and cognitive and mild to moderate gait  impairment.  On admission her Tegretol level was 14.1 and it was felt  that possibly her fall was secondary to the elevated Tegretol level.   Unfortunately, while hospitalized she was found to be incompetent to  make her own decisions by Dr. Antonietta [name] of psychiatry, which I  agree with.  She is chronically confused and is, I think, a danger to  herself to be on her own and needs an assisted living status.   She had no  further incidents in terms of lightheadedness, dizziness,  syncope or any new focal neurologic deficit, and her condition was  stable while hospitalized.   She will require to be in an assisted-living-like setting and her  daughter is coming from [place] to help assist with her post=hospital  care.   She will be discharged on above medications and follow up in my office  in 1 week.   DISCHARGE LABORATORIES:  White count 3700, hemoglobin 11.2 and platelet  count 203,000 on August 28, 2007, as well as sodium 135, potassium  4.0, chloride 103, bicarb 25, glucose 134, BUN 19, creatinine 0.96,  calcium 8.7.  Hemoglobin A1c from August 25, 2007, was 7.2.  RPR  nonreactive on June 24, 2007.  Folate level 10.4.  TSH 1.371.  Vitamin  B12 level 274, ferritin 22, iron 49.  CK-MB of 1.2, CK of 47 and  troponin I of 0.02 on August 25, 2007.   She did have a random cortisol level on September 23 that was 14.2 and  Tegretol level on admission was in the toxic range at 14.1 and repeat  level on August 27, 2007, was 4.7.  Repeat level will be drawn prior  to discharge.   She is discharged from the hospital in stable condition but with  significant enough dementia, memory, to feel to be incompetent to  function without assistance as an outpatient.      [name] [name], M.D.  Electronically Signed     RNG/MEDQ  D:  08/30/2007  T:  08/30/2007  Job:  [id number]

## 2011-04-18 NOTE — Discharge Summary (Signed)
NAME:  [name], [name] NO.:  [id number]   MEDICAL RECORD NO.:  [id number]          PATIENT TYPE:  OBV   LOCATION:  5511                         FACILITY:  [facility]   PHYSICIAN:  Candyce Churn, M.D.DATE OF BIRTH:  January 07, 1928   DATE OF ADMISSION:  08/09/2008  DATE OF DISCHARGE:  08/10/2008                               DISCHARGE SUMMARY   DISCHARGE DIAGNOSES:  1. Epistaxis, resolved.  2. Anemia likely secondary to epistaxis.  3. Bright red blood per rectum several days ago, resolved with a      history of a possible pedunculated polyp in the rectal canal which      will be worked up as an outpatient.  4. Seizure disorder.  5. Type 2 diabetes.  6. Coronary artery disease.  7. Mild multi-infarct dementia.   DISCHARGE MEDICATIONS:  1. Tegretol 200 mg p.o. in the a.m. and 400 mg in the p.m.  2. Plavix 75 mg daily - hold.  3. Prilosec 20 mg daily.  4. Aspirin 81 mg - hold.  5. Glyburide 2.5 mg daily.  6. Imdur 30 mg daily.  7. Benicar HCT 20/12.5 mg daily.  8. Simvastatin 20 mg daily.  9. Senokot tablets 1-2 p.o. p.r.n. constipation.   HOSPITAL COURSE:  [name] [name] is a very pleasant 77 year old female  with mild dementia secondary to prior CVAs.  She is usually able to take  her medications properly but experienced significant nosebleed over the  last 24 hours.  She did notice maroon-colored stools following the  epistaxis.  She is feeling much better today after complaining of  weakness yesterday.  She had eaten [employer] caf[employer] 5 days ago and  experienced severe diarrhea and the weakness started at that time.   She has been hydrated overnight with intravenous fluids and hemoglobin  is essentially stable with just a minimal drop of 10.3 to 9.9 with IV  hydration overnight.  She is having no further epistaxis and no blood  per rectum and vital signs are stable.   Plans are to work her up as an outpatient and I will see her in the  office in 24 hours.   The patient is requesting to go home.  She will  have one of her neighbors pick her up at the hospital and take her home.   Again, we will be holding Plavix and aspirin for now.   CONDITION ON DISCHARGE:  Improved.   OTHER DISCHARGE LABORATORY DATA:  Includes the following:  White count  3500, hemoglobin 9.9, and platelet count 183,000.  Sodium 139, potassium  4.8, chloride 110, bicarb 24, glucose 103, BUN 17, creatinine 0.98,  hemoglobin A1c 6.7%, reticulocyte count 0.8, absolute reticulocyte 25.4,  iron 76, iron-binding capacity 290, percent saturation 26%, B12 level  399, folate 12.8, and ferritin 30, all are normal.  TSH 1.090, normal.  Magnesium 2.1.  Serial cardiac enzymes within normal limits with CK max  of 120 and troponin-I of 0.03 and 0.01.  Pro time on admission was 13.2  seconds with an INR of 13.2, PTT normal at 20 seconds.  CONDITION ON DISCHARGE:  Improved.  We will discharge home with follow  up in my office in 24 hours.      Candyce Churn, M.D.  Electronically Signed     RNG/MEDQ  D:  08/10/2008  T:  08/10/2008  Job:  [id number]

## 2011-04-18 NOTE — H&P (Signed)
NAME:  [name] [name], [name] [name] NO.:  [id number]   MEDICAL RECORD NO.:  [id number]          PATIENT TYPE:  EMS   LOCATION:  MAJO                         FACILITY:  [facility]   PHYSICIAN:  Ramiro Harvest, MD    DATE OF BIRTH:  1928/03/14   DATE OF ADMISSION:  08/09/2008  DATE OF DISCHARGE:                              HISTORY & PHYSICAL   ATTENDING PHYSICIAN:  Dr. Ramiro Harvest.   PRIMARY CARE PHYSICIAN:  Candyce Churn, M.D., of [facility].   HISTORY OF PRESENT ILLNESS:  [name] [name] is a 77 year old white female,  history of multi-infarct dementia, type 2 diabetes, coronary artery  disease, questionable history of seizure disorder, who presented to the  ED with epistaxis and generalized weakness.  The patient is a poor  historian, and history was obtained from both the patient and the ED  physician.  Per patient and ED report, the patient 5 days prior to  admission ate at the [employer], experienced a 1-day history of  voluminous diarrhea, which has since resolved.  The patient also had  some bright red blood per rectum 5 days prior to admission which lasted  24 hours with some tenesmus.  The patient called the PCP's office but  could not be seen then.  The patient was still not feeling well on the  day of admission with some generalized global weakness and also had some  epistaxis as well and presented to the ED.  In the ED rectal exam done  per ED physician showed maroon- colored stools and also a pedunculated  polyp above the dentate line.  FOBT done was negative.  CBC obtained  showed a white count of 3.8, hemoglobin of 10.8, platelets of 219,  hematocrit of 31.1.  CMET obtained was unremarkable except an alk  phosphatase of 133.  Coags obtained were within normal limits.  CT of  the abdomen and pelvis with no acute pelvic findings was positive for  sigmoid diverticulosis.  The patient's nosebleed was cauterized in the  ED, and on interview the patient  stated that she was feeling fine.  The  patient denied any fevers.  No chills, no nausea or vomiting.  No  abdominal pain.  No cough, no dysuria.  No hematemesis.  No melena.  No  focal neurological changes.  We were called to admit the patient for  further evaluation and recommendations.   ALLERGIES:  No known drug allergies.   PAST MEDICAL HISTORY:  1. Multiple strokes in the past as well as possible TIAs.  Strokes      involved the left posterior cerebral artery, right posterior      parietal region, also a history of mild dystaxia secondary to prior      history of CNS infarcts, multiple cerebellar infarcts.  2. Multi-infarct dementia.  3. Hypertension.  4. Hyperlipidemia/hypertriglyceridemia.  5. Type 2 diabetes.  6. Depression.  7. Coronary artery disease, status post MI.  8. Questionable history of seizures.  9. History of falls.  10.Status post left knee EUA followed by arthroscopic partial medial  and lateral meniscectomy per Dr. Thurston Hole on January 27, 2005.  11.Status post left knee chondroplasty with partial synovectomy per      Thurston Hole on January 27, 2005.  12.GERD with esophageal spasms.  13.Status post cholecystectomy.  14.Status post hysterectomy.  15.Status post appendectomy.  16.Status post cataract, iridectomy.  17.Status post right mastoidectomy.   MEDICATIONS:  1. Plavix 75 mg p.o. daily.  2. Tegretol 200 mg p.o. q.a.m. and 400 mg p.o. q.h.s.  3. Glyburide 2.5 mg daily.  4. Isosorbide mononitrate 30 mg daily.  5. Aspirin 81 mg daily.  6. Nitroglycerin 0.4 mg sublingual every 5 minutes x3.  7. Simvastatin 20 mg daily.  8. Benicar HCT 20/12.5 mg daily.  9. Prilosec 20 mg daily.   SOCIAL HISTORY:  The patient is divorced and lives alone in an  apartment.  No tobacco use.  No alcohol use.  No IV drug use.   FAMILY HISTORY:  Noncontributory.   REVIEW OF SYSTEMS:  As per HPI, otherwise negative.  A 12-point review  of systems was negative per  patient.   PHYSICAL EXAMINATION:  VITAL SIGNS:  Temperature 97.5, blood pressure  167/87, pulse of 87, respiratory rate 22, satting 98% on room air.  GENERAL:  The patient in no apparent distress.  HEENT:  Normocephalic, atraumatic.  Pupils equal, round and reactive to  light.  Extraocular movements intact.  Oropharynx is clear.  No lesions,  no exudates.  NECK:  Supple.  No lymphadenopathy.  RESPIRATORY:  Lungs are clear to auscultation bilaterally.  No wheezes,  no crackles, no rhonchi.  CARDIOVASCULAR:  Regular rate and rhythm.  No murmurs, rubs or gallops.  ABDOMEN:  Soft, nontender, nondistended.  Positive bowel sounds.  EXTREMITIES:  No clubbing, cyanosis or edema.  NEUROLOGICAL:  The patient is alert and oriented x3.  Cranial nerves II-  XII were grossly intact.  No focal deficits.   LABORATORIES:  FOBT negative.  CBC:  White count 3.8, hemoglobin 10.6,  platelets 219, hematocrit 31.1, ANC of 2.3, lactic acid 2.1, PTT 20, PT  13.2, INR 1.0, sodium 136, potassium 4.3, chloride 107, bicarb 21, BUN  29, creatinine 1.11, glucose of 191, bilirubin 0.4, alk phosphatase 133,  AST 32, ALT 27, calcium of 8.8, albumin of 3.7, protein of 7.4.  Point-  of-care cardiac markers:  CK-MB 3.1, troponin I less than 0.05,  myoglobin of 225, CK of 102, CK-MB 3.2, relative index of 3.1, troponin  I of 0.01.  Urinalysis was yellow, clear.  Specific gravity 1.014, pH of  5, glucose negative, bilirubin negative, ketones negative, blood  negative, protein negative, urobilinogen 0.2, nitrite negative,  leukocytes trace.  Urine microscopy:  WBCs 0 to 2.  Chest x-ray:  No  acute cardiopulmonary findings, stable lung changes.  CT of the abdomen  and pelvis:  No acute upper abdominal CT findings, status post  cholecystectomy and pneumobilia.  No acute pelvic CT findings.  Sigmoid  diverticulitis without active inflammation.   ASSESSMENT AND PLAN:  1. Weakness, questionable etiology.  Differential includes  anemia      versus a cardiac etiology versus metabolic etiology versus debility      versus volume depletion.  We will cycle cardiac enzymes q.8 h x2.      Check a TSH, check a CBC post IV fluid bolus, check a magnesium      level, check an EKG.  We will place on IV fluids, PT, OT and      monitor.  2.  Epistaxis, status post cauterization.  We will hold the patient's      Plavix and aspirin for now and monitor.  3. Gastrointestinal bleed, likely lower gastrointestinal bleed      secondary to diverticulosis versus hemorrhoidal bleed versus      secondary to a pedunculated polyp.  The patient with no further      bleeding episodes over the past 5 days.  We will hydrate with IV      fluids, check a CBC.  May need a GI consult in the a.m. for further      evaluation and recommendations.  4. Elevated liver function tests/alkaline phosphatase.  Check a right      upper quadrant ultrasound and follow.  5. Coronary artery disease, status post myocardial infarction.  Hold      Plavix and aspirin for now secondary to problem #2 and #3.  Also      hold Imdur and Benicar HCT for now secondary to problem #1.      Continue simvastatin and follow.  6. Hypertension.  Hold BP medications.  7. Probable seizure disorder.  Continue home-dose Tegretol.  8. Type 2 diabetes.  Glyburide and sliding-scale insulin.  9. Depression.  10.History of multiple cerebrovascular accidents and transient      ischemic attacks.  Hold Plavix and aspirin for now secondary to      problems #2 and #3.  11.Prophylaxis.  Protonix for GI prophylaxis.  SCDs for DVT      prophylaxis.   It has been a pleasure taking care of Ms. Liliane Shi.      Ramiro Harvest, MD  Electronically Signed     DT/MEDQ  D:  08/09/2008  T:  08/09/2008  Job:  [id number]   cc:   Candyce Churn, M.D.

## 2011-04-18 NOTE — Consult Note (Signed)
NAME:  [name] [name], PITCHER                  ACCOUNT NO.:  [id number]   MEDICAL RECORD NO.:  [id number]          PATIENT TYPE:  INP   LOCATION:  3728                         FACILITY:  [facility]   PHYSICIAN:  Pramod P. Pearlean Brownie, MD    DATE OF BIRTH:  09-Apr-1928   DATE OF CONSULTATION:  DATE OF DISCHARGE:                                 CONSULTATION   REASON FOR REFERRAL:  Fall and gait abnormality.   HISTORY OF PRESENT ILLNESS:  [name] is 77 year old Caucasian who was  admitted with unwitnessed fall yesterday at home.  She lives in an  apartment.  She states that she was walking in the hallway when she felt  a little woozy and fell down without warning.  She had no loss of  consciousness.  She denies any chest pain, blurred vision, or  palpitations.  She denies sweats.  She denies any seizure activity, any  injury, tongue bite or incontinence.  She has had previous episodes of  possible seizure or syncope in the past, but they have not been  __________ .  She has had several seizures, and she has had multiple  __________ .  She was thought to be having seizures and was placed on  Decadron which she is presently taking at a dose of 400 twice a day.  Her level on admission was slightly elevated at 14.1; however, she  denies significant nausea, vomiting, sleepiness, or worsening of her  gait abnormality.  She has had several falls in the past.  She has  diabetes as well as multiple strokes and vision loss from stroke.  She  is also confused at baseline and has mild dementia.   PAST MEDICAL HISTORY:  1. Multiple strokes in the past as well as possible TIAs.  Strokes      involved the left posterior cerebral artery, right posterior      parietal regions.  2. Hypertension.  3. Hyperlipidemia.  4. Diabetes.  5. Depression.  6. Coronary artery disease.  7. Question of seizure disorder.   PAST SURGICAL HISTORY:  1. Gallbladder.  2. Hysterectomy.  3. Appendicectomy.  4. Cataract iridectomy.  5.  Mastoidectomy.   REVIEW OF SYSTEMS:  Significant for dizziness, gait problems, falls  memory loss, and confusion.   PHYSICAL EXAMINATION:  GENERAL:  A pleasant, elderly Caucasian lady who  is at present not in distress.  VITAL SIGNS:  She is afebrile, blood pressure 59/82 in the lying  position and 119/61 in the standing position, respiratory rate 18.  HEENT:  Head is atraumatic.  ENT exam is unremarkable.  NECK:  Supple without bruit.  CARDIAC:  No murmur or gallop.  LUNGS:  Clear to auscultation.  ABDOMEN:  Soft, nontender.  NEUROLOGIC:  The patient is awake, alert.  She is oriented only to  person.  She does not know the name of the hospital.  She is able to  name the city but not the county.  She follows one-step and a few two-  step commands.  She has diminished attention.  Short-term memory is  poor, 0/3 recall.  She is unable to name any animals.  She has no  apraxia or aphasia.  Eye movements are full range with no nystagmus.  Dense __________  hemianopsia.  Visual acuity seems adequate.  Face is  symmetric.  Palatal movements normal.  Tongue is midline.  Motor system  exam reveals no upper extremity drift; however, left upper extremity  movements are limited due to pain in the left elbow.  She moves equally  well __________  Knee jerks and ankle jerks are both elicited.  Plantars  are downgoing.  She has diminished vibration over the toes.  The patient  is able to get up with minimum assist.  She walks with a slightly broad-  based gait.   DATA REVIEW:  CT scan of the head reveals no acute abnormality over  __________ .  Tegretol level on admission was high at 14.1.   IMPRESSION:  77 year old lady who admits to fall at home, likely  multifactorial etiology given her mild __________  as well as baseline  gait difficulties from multiple strokes, vision loss, diabetic  neuropathy, as well as vascular dementia which is mild.  I doubt she has  had an unwitnessed seizure.    PLAN:  I would recommend checking an EEG, MRI,  physical therapy for  gait consult.  Decrease __________  level dose to 200 in the morning and  400 at night.  Repeat level in about a week.  I will be happy to follow  the patient in consult.  Kindly evaluate the patient's home situation  with regards to her safety of living alone.           ______________________________  Sunny Schlein. Pearlean Brownie, MD     PPS/MEDQ  D:  08/26/2007  T:  08/26/2007  Job:  [id number]

## 2011-04-18 NOTE — Procedures (Signed)
EEG NUMBER:  [id number].   HISTORY:  This is a 77 year old patient with multiple strokes, TIAs,  possible seizure event.  This is a routine EEG.  No skull defects are  noted.  Medications include Lovenox, aspirin, Plavix, Lipitor, NovoLog  Tegretol.   EEG classification essentially normal awake.   DISCUSSION:  According to background rhythm, this recording consists of  fairly well modulated medium amplitude alpha rhythm of 8 Hz that is  reactive to eye opening and closure.  As the record progresses, the  patient appears to remain in the awaking state.  Photic stimulation is  performed resulting in a minimal but bilateral photic drive response.  Hyperventilation was not performed.  At no time during the recording  does there appear to be evidence of spike, spike wave discharges or  evidence of focal slowing.  EKG monitor shows occasional premature  ventricular contractions with heart rate of 66.   IMPRESSION:  This is an essentially normal EEG recording in the awaking  state.  No evidence of ictal or interictal discharges were seen.      Marlan Palau, M.D.  Electronically Signed     ZOX:WRUE  D:  08/28/2007 20:31:04  T:  08/29/2007 11:58:59  Job #:  [id number]

## 2011-04-18 NOTE — H&P (Signed)
NAME:  [name] [name], [name] [name] NO.:  [id number]   MEDICAL RECORD NO.:  [id number]          PATIENT TYPE:  EMS   LOCATION:  MAJO                         FACILITY:  [facility]   PHYSICIAN:  Michiel Cowboy, MDDATE OF BIRTH:  February 06, 1928   DATE OF ADMISSION:  08/25/2007  DATE OF DISCHARGE:  08/25/2007                              HISTORY & PHYSICAL   CHIEF COMPLAINT:  Seizure.   HPI:  The patient is a 77 year old female with history of multiple CVAs  and TIAs with multiple admissions for this in the past, as well as  questionable history of seizure disorder.  Patient had been admitted to  [facility] on multiple occasions for episodes of instability or loss of  vision.  Her last admission was in August 2007.  Patient lives alone at  home.  Earlier this morning she was washing her hair and apparently had  sustained a fall.  Her neighbors heard her fall and came to her rescue.  Given the fact the patient has history of seizures, it was assumed that  this was possibly a seizure.  Patient came to but was not at baseline,  was too weak to stand up and too wobbly.  At this point EMS brought her  to the emergency department.  In the emergency department the patient  received Ativan 1 mg IV and was attempted to be discharged but was too  wobbly to stand up and unable to leave her wheelchair.  At this point  patient was going to be admitted to hospitalist service.  Her primary  care physician is Dr. Shaune Pollack with [facility].   Of note, in July 2007 the patient had neurological evaluation done by  Dr. Ellison Carwin.  At this point it was thought that patient has  occasional episodes of going stiff without tonic clonic activity, loss  of urine or bowel or tongue biting.  It is unclear if this is true  seizures.  Her EEG was normal.  Patient appears to be somewhat confused  on every admission to the hospital.  On the last admission in August  2007 patient had undergone an  echocardiogram which was within normal  limits.  She also has had an MRI which showed multiple prior strokes.  Patient also has had a Doppler ultrasound that showed bilateral  stenosis, moderately extensive soft plaque in the proximal segment of  both right and left carotids.   Patient reports that she lives at home alone.  Her history is unclear  due to the fact the patient is currently confused.  She  cannot recall  the president, has very poor short term memory, unable to recall 3 words  after about 5-10 minutes although able to repeat them immediately  afterwards back.  Hard to follow commands secondary to poor memory.  The  patient is very easy to distract while examined.  She reports multiple  episodes of falling.   FAMILY HISTORY:  Significant for coronary artery disease in both  parents.   SOCIAL HISTORY:  Significant for occasional tobacco use, denies alcohol  or  drug abuse.   No known drug allergies.   MEDICATIONS:  As per discharge summary from August 2007 glyburide 2.5 mg  daily, aspirin 162 mg daily, Tegretol 400 mg p.o. b.i.d., Imdur 30 mg  daily, Benicar 20/12.5 mg daily, Caduet 5/10 daily, Aciphex 20 mg daily  and Plavix daily.   PAST MEDICAL HISTORY:  1. Extensive history of prior TIAs, also bilateral cerebrovascular      accidents including left and right occipital lobes, left temporal      and right parietal region.  2. Hypertension.  3. Hypertriglyceridemia.  4. Type 2 diabetes.  5. Depression.  6. Coronary artery disease.  7. Questionable seizure disorder.   SURGICAL HISTORY:  Includes a cholecystectomy, hysterectomy,  appendectomy, cataract surgery, iridectomy and a left and right  mastoidectomy.   REVIEW OF SYSTEMS:  Unable to obtain secondary to poor mental status.   PHYSICAL EXAM:  VITALS:  Temperature 97.5, pulse 97, respirations 18,  blood pressure 151/74, sating 97% on room air.  GENERAL:  A well-appearing female in no acute distress.  HEENT:   PERRLA, no lymphadenopathy noted.  NECK:  Supple.  LUNGS:  Clear to auscultation anteriorly bilaterally.  HEART:  Regular rate and rhythm, no murmurs, rubs or gallops.  ABDOMEN:  Nontender, nondistended, normal bowel sounds.  EXTREMITIES:  No edema present.  SKIN:  Multiple ecchymoses.  NEUROLOGIC:  Cranial nerves II-XII intact, strength intact bilaterally  and symmetrical, down going toes on Babinski's, normal reflexes.  PSYCHIATRIC:  Patient is oriented to self only, cannot recollect her  birth date, cannot recollect situation, unable to tell date, has very  poor short term memory and poor judgment.   LABORATORY STUDIES:  H&H 11.6/34, potassium 4.3, sodium 136.   VBG 7.489/28  EKG shows no ST elevation or depression.  There is a flipped T-wave in  lead AVL, no Q-waves noted.   CT of the head shows diffuse ischemic disease but no new lesions.  No  bleeding noted.   ASSESSMENT AND PLAN:  This is a 77 year old female presenting with  transient ischemic attack versus seizure.  1. Transient ischemic attack/seizure with now unstable gait.  Will      evaluate for potential new stroke.  Given the prior history this is      a very high possibility.  At this point patient is almost 8 hours      out from her original episodes and it is unclear what is her      baseline to begin with.  For now will obtain MRI/MRA of her brain.      Will ask for a neurologic consult to adjust patient's medications,      as well as to reevaluate again potential seizure disorder and      determine if patient needs to be on Tegretol.  At this point will      hold Tegretol given the fact that her level is elevated at 14.  2. Hypertension.  Will continue home medications.  3. Diabetes.  Will put on sliding scale and continue glyburide.  4. Unsteady gait.  In addition, will also evaluate for B12 deficiency      as well as folate deficiency.  Will obtain TSH and RPR.  Will order      physiotherapy and  occupational therapy consult as well as speech      pathology.  5. Given history of coronary artery disease, will place patient on      telemetry and cycle enzymes.  A prior echocardiogram done about a      year ago and showed normal ejection fraction.  Consider repeating      if no other etiology is found.  Will monitor on telemetry for      arrhythmias.  6. Anemia.  Will obtain iron panel, B12 and folate.  7. History of HTN Will monitor while in-house.   DISPOSITION:  Will attempt to contact family.  At this point patient  provides her own phone number.  Will consult clinical care coordinator.  Patient will very likely require placement.   PROPHYLAXIS:  Protonix and Lovenox.      Michiel Cowboy, MD  Electronically Signed     AVD/MEDQ  D:  08/25/2007  T:  08/25/2007  Job:  [id number]   cc:   Duncan Dull, M.D.

## 2011-04-21 NOTE — Discharge Summary (Signed)
NAME:  [name] [name], [name] [name] NO.:  [id number]   MEDICAL RECORD NO.:  [id number]                   PATIENT TYPE:  INP   LOCATION:  4735                                 FACILITY:  [facility]   PHYSICIAN:  Candyce Churn, M.D.          DATE OF BIRTH:  January 30, 1928   DATE OF ADMISSION:  10/06/2002  DATE OF DISCHARGE:  10/09/2002                                 DISCHARGE SUMMARY   DISCHARGE DIAGNOSES:  1. Chest pain, possibly cardiac in nature.  2. Gastroesophageal reflux disease with possible esophageal spasm causing     chest pain.  3. Hypertension.  4. Type 2 diabetes mellitus.  5. Seizure disorder.  6. Hyperlipidemia.  7. Cerebrovascular accident, June 2003, with ocular symptoms which have     resolved.  8. Remote myocardial infarction per electrocardiogram.   DISCHARGE MEDICATIONS:  1. Glyburide 2.5 mg daily.  2. Tegretol 400 mg b.i.d.  3. Nexium 40 mg or Aciphex 20 mg daily.  4. Norvasc 10 mg daily.  5. Hydrochlorothiazide 20 mg daily.  6. Lipitor 10 mg a day.  7. Imdur 30 mg a day.   CONSULTATIONS:  Cardiology, Dr. Lyn Records.   PROCEDURE:  1. Cardiac catheterization -- October 08, 2002 -- left ventriculogram     revealed an ejection fraction of approximately 50%.  2. Coronary angiography revealed normal left main, 20% proximal left     anterior descending, first diagonal with a 40-50% stenosis and second     diagonal with a 60-70% stenosis; circumflex with 25% proximal eccentric     stenosis and right coronary artery is normal.   Cardiac catheterization was not significantly different that one performed  on March 19, 2000.   HOSPITAL COURSE:  The patient is a very pleasant 77 year old female with  multiple risk factors for coronary artery disease.  She started having some  substernal chest pain with diaphoresis on the day prior to admission.  On  and off during the night prior to admission, she had a lot of sweating and  chest  pressure and she took Nexium for symptoms, thinking it was acid  reflux, and this did not improve her symptoms; she had not been taking it  regularly because of its cost.  She took two nitroglycerin on the day of  admission at home and her chest pain resolved and then she came to [facility] Emergency Room.  She was chest-pain-free there.  She said the  substernal chest pressure and pain seem to be different that her usual acid  reflux symptoms.  She was admitted with the thought that she might have  unstable angina and started on Lovenox and intravenous nitroglycerin.   While hospitalized, she had negative cardiac enzymes to rule out myocardial  injury, with initial CPK of 58 and a subsequent CPK 8 hours later of 63.  CK-  MB was 1.6, followed by 1.7,  respectively, and troponin I was 0.02 on  admission and 0.04 eight hours later.  A third troponin I was drawn eight  hours subsequent to the second and was 0.02.   The patient was seen in consultation by Dr. Verdis Prime, who felt that this  could be unstable angina, and the coronary angiography was performed, as  above.   It was felt that she might be having some coronary spasm and it was  recommended that a long-acting nitrate be started at discharge.   Other considerations for pain would be esophageal reflux or spasm and it is  certainly known that Norvasc did cause and contribute to her esophageal  reflux.  I would like to try to attempt a lower dose of Norvasc over the  next couple of weeks, just monitoring her blood pressure to make sure that  this can be performed.   She is not on an H receptor blocker and we will certainly look into this  possibility as an alternative to Norvasc therapy, or at least allowing [place] to  reduce her dose of Norvasc.   Her other medical problems were stable while hospitalized and will resume  medications as above with the addition of Imdur 30 mg daily and consider  reducing Norvasc and perhaps adding  an H receptor blocker or ACE inhibitor  as an outpatient.                                               Candyce Churn, M.D.    RNG/MEDQ  D:  10/09/2002  T:  10/10/2002  Job:  [id number]   cc:   Lesleigh Noe, M.D.  [address]  [address]  [place]  [place] [address]  Fax: [phone]

## 2011-04-21 NOTE — Discharge Summary (Signed)
NAME:  [name], [name] NO.:  [id number]   MEDICAL RECORD NO.:  [id number]          PATIENT TYPE:  INP   LOCATION:  3703                         FACILITY:  [facility]   PHYSICIAN:  Candyce Churn, M.D.DATE OF BIRTH:  1927/12/07   DATE OF ADMISSION:  05/27/2006  DATE OF DISCHARGE:  05/29/2006                                 DISCHARGE SUMMARY   DISCHARGE DIAGNOSES:  1. Probable TIAs, symptomatically improved.  2. History of multiple CVAs including the bilateral occipital lobe, left      cerebellar and right parietal areas.  3. Diabetes mellitus.  4. Hypertension.  5. Coronary artery disease status post MI.  6. Seizure disorder.  7. Depression.   DISCHARGE MEDICATIONS:  1. Glyburide 2.5 mg daily.  2. Aspirin 162 mg daily.  3. Tegretol 400 mg p.o. b.i.d.  4. Imdur 30 mg daily.  5. Benicar HCT 20/12.5 daily.  6. Caduet 5/10 daily.  7. AcipHex 20 mg daily.  8. Plavix 75 mg daily, which is new.   CONSULTATIONS:  Dr. Kelli Hope, Neurology, May 27, 2006.   PROCEDURE:  1. A 2D echocardiogram performed on May 28, 2006, revealing EF of 50%.      Otherwise essentially within normal limits.  2. Carotid Doppler examination revealed a bilaterally no ICA stenosis and      vertebral artery flow with antegrade, though she did have plaque in      both internal carotids.   HOSPITAL COURSE:  [name] [name] is a very pleasant 77 year old female who  has had multiple strokes in the past and that it does have some mild  cognitive deficits because of this.  She had commented at approximately 2  a.m. on the day of admission, states that she lost vision completely in both  of her eyes.  This lasted for a few minutes and  then resolved.  Then she complained of a severe headache for which she took  Tylenol.  She also complained of some double vision on the day of admission  with an unsteady and dizziness.   She gave a different history to the neurologist, Dr. Kelli Hope who  consulted on the same day.      Candyce Churn, M.D.  Electronically Signed     RNG/MEDQ  D:  07/21/2006  T:  07/21/2006  Job:  [id number]

## 2011-04-21 NOTE — Discharge Summary (Signed)
[facility]  Patient:    [name] [name], [name] [name]                           MRN: [id number] Adm. Date:  [id number] Disc. Date: [id number] Attending:  Bonnetta Barry CC:         Velora Heckler, M.D.             Pearla Dubonnet, M.D.                           Discharge Summary  REASON FOR ADMISSION:  Symptomatic cholelithiasis, chronic cholecystitis.  HISTORY OF PRESENT ILLNESS:  The patient is a 77 year old white female who presented on referral for Dr. Kevan Ny with symptomatic cholelithiasis.  The patient had had intermittent epigastric abdominal pain radiating to the back on approximately four occasions over the past six weeks prior to admission. These frequently awakened her at night.  They were associated with onset of diarrhea.  She denies nausea, vomiting, fevers or chills.  She denies any history of jaundice or acholic stools.  She has had no prior history of hepatobiliary or pancreatic disease.  The patient was seen by Dr. Kevan Ny and initially treated with proton pump inhibitor without symptomatic relief. Ultrasound demonstrated multiple gallstones.  The patient was referred to general surgery and prepared for the operating room.  HOSPITAL COURSE:  The patient was admitted to the hospital on Apr 24, 2000. She was taken to the operating room where she underwent an attempted laparoscopic cholecystectomy.  At the time of the procedure, a common bile duct injury resulted and the procedure was converted to an open technique. She underwent open cholecystectomy and intraoperative cholangiography.  She then required Roux-en-Y hepaticojejunostomy as reconstruction of the biliary tract following common bile duct injury.  Postoperative course was initially straight forward.  She was seen in consultation by Dr. Kevan Ny as her primary care physician.  She did well and was maintained on intravenous antibiotics. The patient had gradual resolution of her ileus and  nasogastric tube was removed on postop day #2.  The patient had a persistent bile leak noted in the [facility] drain.  She was slowly advanced on her diet.  However, drainage persisted and she was seen by interventional radiology, at my request, on Apr 30, 2000.  An attempt was made to place a percutaneous transhepatic cholangiocatheter without success.  The patient was subsequently taken back to radiology on May 01, 2000, where a successful intubation of the biliary tract was carried out and a percutaneous transhepatic cholangiocatheter was placed across the anastomosis and into the jejunal limb.  It was left to external drainage.  The patient did well following this procedure.  Follow up laboratory studies on the day of discharge showed a white count to be normal at 5.2, hemoglobin stable at 9.3.  Total bilirubin was normal at 0.5. There was a mild elevation of transaminases with an SGOT of 62 and SGPT of 105 with Alk phos of 171.  Amylase was normal at 44.  CONDITION ON DISCHARGE:  The patient will be discharged May 03, 2000, in good condition, tolerating a regular diet and ambulating independently.  FOLLOWUP:  She will be seen back in my office in one week.  She will also be followed by Dr. Robley Fries, of [facility].  The patient will require injection of her PTC catheter in approximately 7-10 days.  At that time, if things are patent and the leak at the anastomosis has sealed, she will be able to clamp that tube and have the [employer] drain removed. In the interim, I will keep her on prophylactic antibiotics to prevent cholangitis.  DISCHARGE MEDICATIONS: 1. Tegretol. 2. Childrens aspirin. 3. Pepcid. 4. Darvocet as needed for pain.  DISCHARGE DIAGNOSES: 1. Chronic cholecystitis, cholelithiasis. 2. Common bile duct injury requiring reconstruction. DD:  05/03/00 TD:  05/07/00 Job: [id number]

## 2011-04-21 NOTE — H&P (Signed)
NAMEJAQUISHA, FRECH NO.:  [id number]   MEDICAL RECORD NO.:  [id number]                   PATIENT TYPE:  INP   LOCATION:  3707                                 FACILITY:  [facility]   PHYSICIAN:  Lesleigh Noe, M.D.            DATE OF BIRTH:  17-Jul-1928   DATE OF ADMISSION:  06/27/2003  DATE OF DISCHARGE:                                HISTORY & PHYSICAL   INDICATIONS FOR ADMISSION:  Chest discomfort.   HISTORY OF PRESENT ILLNESS:  The patient is a 77 year old who, since at  least 2001, has had intermittent recurrent episodes of chest pain. She  underwent cardiac catheterization in 2001, and this procedure was again  repeated in November 2003. On each occasion, the catheterization did not  demonstrate significant high grade obstructive disease and medical therapy  was chosen as a treatment option. She, on this occasion, experienced  increasing episodes of chest pain over the past 1-2 weeks and increasing use  of nitroglycerin. On the day prior to admission, she had severe prolonged  chest discomfort that she states was the worst that she had ever had. She  did not try nitroglycerin for the first severe episode but subsequently  since yesterday, has used nitroglycerin on multiple occasions. She came to  the emergency room this morning because of recurrence of chest pain that was  associated with shortness of breath and sweating. She states that the  nitroglycerin helped after a while. She was admitted to the hospital for  evaluation of chest pain.   PAST MEDICAL HISTORY:  1. Hypertension.  2. Diabetes type 2.  3. Gastroesophageal reflux disease.  4. Hyperlipidemia.  5. History of CVA in 2003.  6. History of seizure disorder.   MEDICATIONS:  1. Glyburide 2.5 mg daily.  2. Tegretol 400 mg b.i.d.  3. Nexium 40 mg daily.  4. Norvasc 10 mg daily.  5. HCTZ 25 mg daily.  6. Lipitor 10 mg daily.  7. Imdur 30 mg daily.   HABITS:  The patient  denies ethanol consumption. She does not smoke.   FAMILY HISTORY:  There is a family history of coronary artery disease  involving her father.   PAST SURGICAL HISTORY:  1. Hysterectomy.  2. Appendectomy.  3. Cholecystectomy, prolonged course secondary to biliary duct tear.   REVIEW OF SYSTEMS:  No other specific complaints. Difficulty sleeping.  Weight and appetite have been stable.   PHYSICAL EXAMINATION:  VITAL SIGNS:  Blood pressure 140/60, heart rate 70,  weight not obtained, respiratory rate 16 nonlabored.  SKIN:  Negative for cyanosis or erythema.  HEENT:  Unremarkable. No xanthelasma.  NECK:  No JVD, carotid bruits, or thyromegaly.  CHEST:  Clear to auscultation and percussion.  CARDIAC:  No murmur, click and no rub.  ABDOMEN:  Soft. Liver edge is not palpable. Bowel sounds are normal.  EXTREMITIES:  No edema. Pulses are  2+ and symmetric in the upper and lower  extremities.  NEUROLOGICAL:  No motor or sensory deficits. Her affect is somewhat odd.   LABORATORY DATA:  Initial cardiac markers are all negative with the  exception of elevated myoglobins, which are of uncertain significance.   EKG reveals no acute ST-T wave changes.   Chest x-ray, no acute change.   ASSESSMENT:  1. Recurrent chest pain without objective evidence of ischemia or     infarction. Recent cardiac catheterization was performed in November     2003, demonstrating mild to moderate coronary disease with 50% second     diagonal stenosis, 50% first diagonal stenosis, 25% stenosis of the     proximal circumflex, 20% stenosis of the proximal left anterior     descending and no significant obstruction in a nondominant right     coronary. Left ventricular function was normal.  2. Hypertension with borderline control.  3. History of seizure disorder, on Tegretol.   PLAN:  1. Serial markers and EKGs; rule out ischemia.  2. Lovenox.  3.     IV nitroglycerin.  4. Probable adenosine/Cardiolite to rule out  ischemia if no objective     evidence of coronary disease progression by clinical profile.                                                Lesleigh Noe, M.D.    HWS/MEDQ  D:  06/27/2003  T:  06/27/2003  Job:  [id number]   cc:   Georgann Housekeeper, M.D.  [address]  [place]  [place] [address]  Fax: [phone]   Candyce Churn, M.D.  [address] [place]  [place] [address]  Fax: [phone]

## 2011-04-21 NOTE — H&P (Signed)
NAME:  [name] [name], [name] [name] NO.:  [id number]   MEDICAL RECORD NO.:  [id number]          PATIENT TYPE:  EMS   LOCATION:  MAJO                         FACILITY:  [facility]   PHYSICIAN:  Ladell Pier, M.D.   DATE OF BIRTH:  [birth year]   DATE OF ADMISSION:  05/27/2006  DATE OF DISCHARGE:                                HISTORY & PHYSICAL   CHIEF COMPLAINT:  Loss of vision.   HISTORY OF PRESENT ILLNESS:  The patient is a 77 year old white female with  past medical history significant for stroke x2, hypertension, diabetes,  seizure disorder since childhood.  The patient stated that approximately 2  a.m., she had lost vision completely in both eyes.  This lasted for a few  minutes and then resolved.  This was her presenting symptom twice when she  had a stroke in the past.  She complained of a severe headache prior to this  event for which she was taking Tylenol that she borrowed from her neighbor.  She stated that this morning she had some double vision that also resolved,  but her gait is unsteady and she is dizzy.  She has no chest pain or  shortness of breath, no nausea or vomiting.   PAST MEDICAL HISTORY:  1.  Seizure disorder.  2.  Coronary artery disease status post MI.  3.  Hypertension.  4.  Diabetes.  5.  GERD.  6.  Stroke x2.  7.  Depression.   PAST SURGICAL HISTORY:  Status post:  1.  Cholecystectomy.  2.  Hysterectomy.  3.  Appendectomy.  4.  Tonsillectomy.  5.  Cataract surgery.  6.  Right mastoidectomy.  7.  Hemorrhoidectomy.   FAMILY HISTORY:  Both parents deceased from coronary artery disease.   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  She has five  children that are all living out of town.  She is divorced.  No tobacco or  alcohol use.   MEDICATIONS:  1.  Glyburide 2.5 mg daily.  2.  Caduet 5/10 q.h.s.  3.  Carbamazepine 400 mg b.i.d.  4.  Benicar/HCTZ daily.  5.  Aspirin 81 mg daily.  6.  Tylenol p.r.n.  7.  Zoloft  daily.  8.  AcipHex 20 mg daily.  9.  Imdur 30 mg a half daily.   ALLERGIES:  No known drug allergies.   REVIEW OF SYSTEMS:  See HPI.   PHYSICAL EXAMINATION:  VITAL SIGNS:  Temperature 98.2, blood pressure  155/66, pulse 86, respirations 20, pulse-ox 98% on room air.  HEENT:  Head is normocephalic, atraumatic, pupils equal, round, and reactive  to light, decreased peripheral vision secondary to old stroke.  LUNGS:  Clear to auscultation bilaterally.  CARDIOVASCULAR:  Regular rate and rhythm.  ABDOMEN:  Positive bowel sounds.  EXTREMITIES:  No edema.  NEURO:  Cranial nerves II-XII intact, strength 5 out of 5 throughout, gait  unsteady, loss of bilateral peripheral vision.   LABORATORY DATA:  WBC 4.2, hemoglobin 11.3, hematocrit 166, MCV 96, sodium  136, potassium 4.2, chloride 104, CO2 23, BUN 31,  creatinine 1.6, glucose  228, UA normal, Tegretol level 9.4.  Head CT showed old left occipital and a  right parenchymal occipital infarct, no acute changes.   IMPRESSION:  1.  Cerebrovascular accident with a history of cerebrovascular accidents.      With new symptoms, the patient has probably had a new event.  Will      admit, get two-D echocardiogram, MRI, MRA, carotid Dopplers, hemoglobin      A1c, homocystine level, increase aspirin to 325 mg a day, consult      Neurology.  2.  Diabetes.  Her blood sugar is elevated today.  Will put her back on her      home medications and add sliding scale insulin.  3.  Hypertension.  Will continue her home medications.  4.  Mild anemia.  Will guaiac her stools.      Ladell Pier, M.D.  Electronically Signed     NJ/MEDQ  D:  05/27/2006  T:  05/27/2006  Job:  [id number]   cc:   Casimiro Needle L. Thad Ranger, M.D.  Fax: [phone]

## 2011-04-21 NOTE — Op Note (Signed)
NAMEDEARIA, WILMOUTH NO.:  [id number]   MEDICAL RECORD NO.:  [id number]          PATIENT TYPE:  INP   LOCATION:  2899                         FACILITY:  [facility]   PHYSICIAN:  Elana Alm. Thurston Hole, M.D. DATE OF BIRTH:  [birth year]   DATE OF PROCEDURE:  01/27/2005  DATE OF DISCHARGE:                                 OPERATIVE REPORT   PREOPERATIVE DIAGNOSIS:  Left knee medial and lateral meniscal tears with  chondromalacia and synovitis.   POSTOPERATIVE DIAGNOSIS:  Left knee medial and lateral meniscal tears with  chondromalacia and synovitis.   PROCEDURE:  1.  Left knee EUA followed by arthroscopic partial medial and lateral      meniscectomies.  2.  Left knee chondroplasty with partial synovectomy.   SURGEON:  Elana Alm. Thurston Hole, M.D.   ANESTHESIA:  Local and MAC.   OPERATIVE TIME:  30 minutes.   COMPLICATIONS:  None.   DESCRIPTION OF PROCEDURE:  Ms. Frank is brought to operating room on  January 27, 2005, after the block had been placed in the holding room by  anesthesia. She was placed on the operative table in supine position. Left  knee was examined under anesthesia. Range of motion 0 to 125 degrees, 1 to  2+ crepitation, knee stable ligamentous exam with normal patellar tracking.  Left leg was prepped in sterile DuraPrep and draped using sterile technique.  Originally through an anterolateral portal, the arthroscope with a pump  attached was placed and through an anteromedial portal an arthroscopic probe  was placed. On initial inspection of the medial compartment, she was found  to have 75% grade III chondromalacia which was debrided. Medial meniscus  showed tearing of the posterior and medial 50-70% was resected back to  stable rim.  Intercondylar notch was inspected, anterior and posterior  cruciate ligaments were normal. Lateral compartment inspected grade I and II  chondromalacia was noted. Lateral meniscus tear posterior and lateral  horn  25 to 30%  was resected back to stable rim. Patellofemoral joint showed 25-  30% grade III chondromalacia which was debrided. The patella tracked  normally. Moderate synovitis of the medial and lateral gutters were  debrided, otherwise they are free of pathology. After this was done, it was  felt that all pathology been satisfactorily addressed. The instruments were  removed. Portals closed with 3-0 nylon suture and injected with 0.25%  Marcaine with epinephrine and 4 mg of morphine. Sterile dressings applied  and the patient awakened and taken to recovery in stable condition none.  Sponge and needle counts were correct x2 at the end of the case.     RAW/MEDQ  D:  01/27/2005  T:  01/27/2005  Job:  [id number]

## 2011-04-21 NOTE — H&P (Signed)
[facility]  Patient:    [name] [name], [name] [name]                           MRN: [id number] Adm. Date:  [id number] Disc. Date: [id number] Attending:  Lyn Records. Iii Dictator:   Anselm Lis, N.P. CC:         Pearla Dubonnet, M.D.                         History and Physical  SUBJECTIVE:  This is a very pleasant 77 year old with history of borderline diabetes mellitus, elevated cholesterol, hypertension, and old CVA (residual short term memory defect), history of seizure disorder (last episode about a week earlier; episode prior to this approximately two years earlier).  She also has history of chest pain syndrome; has had over the last few years. Symptoms include episodic anterior chest pressure with radiation to the right arm.  She has noted that discomfort is intense in severity, usually occurring nocturnally and not particularly associated with exertion.  With this she will have associated diaphoresis.  A follow-up adenosine Cardiolite revealed a fixed inferior abnormality suggestive of a fixed inferior abnormality with suggestion of apical ischemia.  Overall normal wall motion study with EF of 62%.  The patient has been counseled and accepted plans for coronary angiography with possible percutaneous intervention.  PREVIOUS MEDICAL HISTORY: 1. (1980) CVA with residual short term memory defect; right small nevus    homonymous hemianopsia and mild right upper extremity spasticity. 2. Dyslipidemia; Bayol. 3. Hypertension. 4. Borderline diabetes mellitus. 5. History of mild mitral regurge, mild TR, trace pulmonic insufficiency by    echo - Dr. Daisy Floro in 1996. 6. History of UTI. 7. Remote right trochanteric bursitis. 8. Cataracts.  PREVIOUS SURGICAL HISTORY: 1. Appendectomy. 2. Hysterectomy without BSO. 3. Right ear surgery.  ALLERGIES:  No known drug allergies; _____ with SEAFOOD, SHELLFISH, _________ products.  MEDICATIONS: 1. Tegretol 200  mg tabs two tabs p.o. q.a.m., one at noon and one at bedtime. 2. Norvasc 10 mg p.o. q.d. 3. Atenolol 50 mg one half b.i.d. 4. Aspirin 325 mg a day. 5. Hydrochlorothiazide 12.5 mg p.o. q.d.  SOCIAL HISTORY/HABITS:  Remote light use of tobacco, now negative.  ETOH: Negative.  Patient is divorced and has five children (three daughters and two sons) who are all out of state.  Negative CAD.  FAMILY HISTORY:  Father deceased at age 35, question of CA.  Mother deceased at 53, old age related problems.  Patient has three brothers, one of whom died of throat cancer.  Three sisters, one who died in her 73s of uncertain causes. Her sister, Jerrell Mylar, is here today.  Another sister has an uncertain type of cancer and is in a nursing home facility.  REVIEW OF SYSTEMS:  FH/previous medical history.  Otherwise denies problems with syncope, near syncope, syncopal episodes, or dizziness or light-headedness.  Negative as far as ear fluid.  Denies problems with constipation, diarrhea, no bright red blood per rectum.  She did have a history of diarrhea which is now resolved.  No dysuria, no hematuria.  Denies problems with pedal edema, orthopnea, or PND.  She has some slight cognitive difficulties; short term memory loss, but functions well living independently.  PHYSICAL EXAMINATION:  VITAL SIGNS:  Blood pressure is 175/65 with heart rate of 66 and regular, respiratory rate 18, temperature 97.6, height 5 feet 4 inches, weight  178 pounds.  GENERAL:  In general she is an overweight 77 year old appearing about her stated age.  Her brother-in-law and sister accompany her today.  HEENT:  _________ .  NECK:   Without bruit, no JVD, no thyromegaly.  CHEST:  Lung sounds clear after cough.  Negative CTA tenderness.  CARDIAC:  Regular rate and rhythm with soft systolic murmur, soft S4, split S2.  ABDOMEN:  Soft, nontender, nondistended normal active bowel sounds.  Negative abdominal aortic, renal or  femoral bruit.  Nontender to palpation.  No organomegaly appreciated.  Difficult to exam secondary to obesity.  EXTREMITIES:  Distal pulses intact; negative pedal edema.  NEUROLOGIC:  Poor short term memory.  GENITOURINARY/RECTAL:  Deferred.  LABORATORY:  Sodium 137, potassium 0.2, chloride 102, CO2 23, BUN 19, creatinine 1.0, and glucose 187.  CBC reveals hemoglobin 11.2 with WBC of 5, and platelets of 257.  Coags:  Pro time 12.2 with INR 1.1 and PTT of 26.  Electrocardiogram was read by Dr. Daisy Floro March 1996, revealed mild MR, mild TR, trace pulmonary insufficiency.  Chest x-ray from March 14, 2000, reveals mild basilar atelectasis.  EKG from February 29, 2000 revealed NSR at 69 beats per minute with small Q-wave in lead 3; negative ischemic changes.  IMPRESSION: 1. Chest pain in 77 year old with history of diabetes mellitus, hypertension,    dyslipidemia.  A follow-up Cardiolite was suspicious for old inferior scar    with question apical ischemia. 2. Dyslipidemia; Baycol. 3. Hypertension. 4. Old cerebrovascular accident. 5. History of seizure disorder.  PLAN:  Cardiac catheterization with coronary angiography and possible percutaneous intervention as indicated and able.  Risks, potential complications, benefits, and alternatives to procedure discussed in detail with Ms. Velie and her brother-in-law and sister Jerrell Mylar.  The family and patient indicate their questions and concerns have been addressed and are agreeable to proceed. DD:  03/19/00 TD:  03/19/00 Job: [id number]

## 2011-04-21 NOTE — H&P (Signed)
NAME:  [name] [name], SCAFIDI NO.:  [id number]   MEDICAL RECORD NO.:  [id number]                   PATIENT TYPE:  INP   LOCATION:  4735                                 FACILITY:  [facility]   PHYSICIAN:  Georgann Housekeeper, M.D.                 DATE OF BIRTH:  Nov 26, 1928   DATE OF ADMISSION:  10/06/2002  DATE OF DISCHARGE:                                HISTORY & PHYSICAL   CHIEF COMPLAINT:  Chest pain and shortness of breath.   HISTORY OF PRESENT ILLNESS:  The patient is 77 years old with history of  type 2 diabetes, hypertension, coronary artery disease, hyperlipidemia, a  history of CVA, was reportedly having chest pain on and off since yesterday.  She stated that yesterday she was ready to go into the church, started  having some substernal chest pain and a little diaphoresis and then got  better.  On and off, she had these episodes and during the night, she felt  bad, a lot of diaphoresis with this, and this morning she started feeling  the same symptoms and she started with acid reflux and took her Nexium, did  not feel any better.  Later in the day in the afternoon, she took two  nitroglycerin and the chest pain finally resolved and when she came to the  emergency room, she was chest-pain-free and described that this was  different than her acid reflux symptoms.  She denied any chest congestion,  no cough, no fevers, no palpitation, no nausea or vomiting or diarrhea, but  felt sick to her stomach.  She reportedly had seen Dr. Lyn Records,  cardiology, and had some stress tests a few months ago which reportedly were  negative.   PAST MEDICAL HISTORY:  1. Hypertension for many years.  2. Type 2 diabetes, on oral medications.  3. Seizure disorder.  4. Gastroesophageal reflux disease.  5. Hyperlipidemia.  6. History of CVA in June of 2003 in [place] while visiting her daughter with     symptoms of vision.  7. Reported history of coronary artery disease and  remote MI by EKG.   MEDICATIONS:  1. Glyburide 2.5 mg every day.  2. Tegretol 400 mg b.i.d.  3. Nexium 40 mg every day.  4. Norvasc 10 mg every day.  5. Hydrochlorothiazide 25 mg every day.  6. Lipitor 10 mg every day.  7. Nitroglycerin p.r.n.   ALLERGIES:  None.   PAST SURGICAL HISTORY:  1. Status post gallbladder surgery with prolonged course of biliary duct     tear requiring drainage and re-surgery at [facility] in 2002.  2. Status post appendectomy.  3. Status post total abdominal hysterectomy.   SOCIAL HISTORY:  She does not smoke.  No alcohol.  She is widowed; she was  divorced since 12.  Five children; they all live in different states, in  [place], [place].  She mostly stayed as housewife.   FAMILY HISTORY:  Family history positive for coronary artery disease.   REVIEW OF SYSTEMS:  As mentioned above.   PHYSICAL EXAMINATION:  VITAL SIGNS:  Temperature 97.4, blood pressure  150/74, pulse 90, respirations 24, saturation of 97%.  GENERAL:  A well-appearing female denies any chest pain, lying comfortably.  HEENT:  Pupils are reactive.  Extraocular muscles intact.  NECK:  No JVD.  LUNGS:  Lungs were clear.  CARDIAC:  Normal S1 and S2 without any murmurs.  ABDOMEN:  Abdomen was soft without any organomegaly.  EXTREMITIES:  There was no edema, good pulses.  NEUROLOGIC:  Exam was nonfocal.   LABORATORY AND ACCESSORY CLINICAL DATA:  Significant CPK of 58, MB and  troponin negative.,  White count 4.0, hemoglobin 12.2.  Chemistries:  Glucose of 257, BUN of 28, creatinine of 1.0, sodium 140, potassium 4.4.   A chest x-ray showed right lung atelectasis, otherwise, unremarkable.   EKG:  Normal sinus rhythm without any acute ST-T changes.   IMPRESSION:  [age]-year-old female with multiple risk factors and  coronary artery disease, with chest pain, intermittent, with some  diaphoresis and shortness of breath.   1. Chest pain/unstable angina.  Rule out myocardial  infarction.  Admit for     telemetry.  Nitroglycerin drip, Lovenox and aspirin.  We will start a     beta blocker and will get a cardiologist in the morning for possible     review of her studies and possibly cardiac catheterization.  2. Type 2 diabetes, on oral agents.  3. Hypertension.  Norvasc and Lopressor.                                               Georgann Housekeeper, M.D.    KH/MEDQ  D:  10/06/2002  T:  10/07/2002  Job:  [id number]

## 2011-04-21 NOTE — Op Note (Signed)
[facility]  Patient:    [name] [name], [name] [name]                           MRN: [id number] Proc. Date: 04/24/00 Adm. Date:  [id number] Attending:  Bonnetta Barry CC:         Pearla Dubonnet, M.D.             Timothy E. Earlene Plater, M.D.                           Operative Report  PREOPERATIVE DIAGNOSES: 1. Biliary colic. 2. Cholelithiasis.  POSTOPERATIVE DIAGNOSES: 1. Cholelithiasis. 2. Chronic cholecystitis. 3. Biliary colic. 4. Common bile duct injury.  PROCEDURE: 1. Attempted laparoscopic cholecystectomy. 2. Open cholecystectomy with intraoperative cholangiography. 3. Roux-en-Y hepaticojejunostomy.  SURGEON:  Velora Heckler, M.D.  ASSISTANT:  Sheppard Plumber. Earlene Plater, M.D.  ANESTHESIA:  General per Dr. Judie Petit.  ESTIMATED BLOOD LOSS:  Less than 100 cc.  PREPARATION:  Betadine.  COMPLICATIONS:  Near transected common bile duct during laparoscopic dissection. Injury recognized and the procedure converted to an open technique.  Biliary tract reconstructed with a Roux-en-Y hepaticojejunostomy.  INDICATIONS:  The patient is a pleasant 77 year old white female referred by Dr. Pearla Dubonnet with symptomatic cholelithiasis.  The patient had been having intermittent epigastric abdominal pain radiating to the back.  This would occur on approximately four occasions over the past six to eight weeks.  It mainly awakens her from sleep at night.  It has been associated with diarrhea.  She denies nausea or vomiting.  She denies fevers or chills.  She has had no past history f jaundice or acholic stools.  She has had no prior hepatobiliary or pancreatic disease.  The patient was seen by Dr. Kevan Ny and treated initially with proton pump inhibitor without symptomatic relief.  An ultrasound of the abdomen demonstrated multiple gallstones.  The patient was referred to our practice for consideration of a cholecystectomy.  DESCRIPTION OF  PROCEDURE:  The procedure is done in operating room #15 at the [facility].  The patient is brought to the operating room and placed in the supine position on the operating room table.  Following the administration f general anesthesia the patient was prepped and draped in the usual strict aseptic fashion.  After ascertaining an adequate level of anesthesia had been obtained, an infraumbilical incision was made with the #15 blade.  Dissection was carried down to the fascia.  The fascia was incised in the midline.  The peritoneal cavity is entered cautiously.  A #0 Vicryl pursestring suture is placed in the fascia.  A  Hasson cannula is introduced under direct vision.  The abdomen was insufflated ith carbon dioxide.  A laparoscope was introduced under direct vision and the abdomen was explored.  There are omental adhesions to the lower midline incision. There is free space across the entire upper abdomen.  Operative ports are placed along the right costal margin in the midline, midclavicular line, and anterior axillary line. The fundus of the gallbladder is grasped and retracted cephalad.  The gallbladder is somewhat foreshortened and slightly intrahepatic.  Adequate visualization is  obtained.  Dissection is begun at the neck of the gallbladder.  The peritoneum s incised.  The peritoneum is stripped down to the neck of the gallbladder.  The structures are markedly shortened and held in close proximity to the liver. Care  is taken to maintain dissection on the surface of the gallbladder.  Arising almost directly posterior to the neck of the gallbladder appears to be the cystic duct. This is dissected out with the [place] and the right angle Optometrist. It is clipped proximally and distally and divided.  A small amount of bile is then noted immediately after division.  This is aspirated.  Further dissection to free the neck of the gallbladder is  continued.  The anterior branch of the cystic artery is divided between Ligaclips, providing for adequate mobilization.  At this point it is noted that further bile is coming from the region of the neck of the gallbladder and porta hepatis.  Therefore a careful inspection is again carried  out.  The cystic duct is identified where the clips have been placed.  It carries up and onto the surface of the gallbladder, measuring approximately 5.0 mm in the greatest length; however, bile was seen to have come from a tubular structure.  With further dissection it would appear that this opening was in the proximal hepatic duct, and that it was nearly transected.  The distal common bile duct has been doubly clipped at the time of division of the structure previously identified as the cystic duct.  Therefore a common bile duct injury is recognized and identified.  A decision is made immediately to convert to an open technique.  Therefore the pneumoperitoneum is released, and all ports are removed.  The #0 Vicryl pursestring suture at the umbilicus is tied securely.  A right subcostal  incision is then made with a #10 blade, and hemostasis obtained with the electrocautery.  The musculature was divided and the peritoneal cavity is entered cautiously.  The falciform ligament is divided between [place]wny clamps and ligated  with #2-0 silk ties.  A Thompson retractor is placed for exposure.  Bile was aspirated from the right upper quadrant.  The first step is to proceed with a cholecystectomy.  The gallbladder is taken down in a retrograde fashion using the electrocautery for hemostasis in the gallbladder bed.  Upon coming to the neck f the gallbladder, again a very short approximately 5.0 mm cystic duct segment is  identified.  The transected distal end of the common bile duct is identified with two clips in place.  The proximal end of the common hepatic duct is identified. It is nearly  transected with only the posterior wall remaining in place. Therefore using Pott scissors, the back-wall is transected.  The gallbladder is removed and  submitted to pathology for review.  Good hemostasis is noted throughout.  Then 4-0 Vicryl stay sutures are placed in the lateral edges of the common hepatic duct.  There is clear bile coming from the common hepatic duct.  Clips are removed off of the distal common bile duct.  It is cannulated with a Rudich catheter.  C-arm fluoroscopy was brought in to the room, and cholangiography is performed. Representative photographs are included in the medical record.  This confirms indeed that this is common bile duct completely transected and there is free flow of contrast through the distal common bile duct and into the duodenum, without filling defect or obstruction.   The Rudich catheter is withdrawn.  The common ile duct is then closed distally with interrupted #3-0 silk sutures.  Next, we proceeded with the formation of the Roux-en-Y limb.  A loop of jejunum  approximately 50 cm distal to the ligament of Treitz was transected with the GI  stapler.  The mesentery was taken down between [place] clamps and ligated with #2-0 silk ties.  The Roux limb is mobilized so that it will easily reach the undersurface of the liver without undue tension.  A retrocolic window is made. The Roux limb was passed through the retrocolic window and into the right upper quadrant.  The Roux construct is completed with a side-to-side anastomosis between the proximal jejunum and the midjejunum.  This was performed with the GIA-type stapler.  The enterotomy was closed with interrupted #3-0 silk sutures.  The mesenteric defect was closed with interrupted #3-0 silk sutures.  The Roux limb was secured to the transverse mesocolon with interrupted #3-0 silk sutures.  The Roux limb lays easily into the right upper quadrant without undue tension.  A serosal button  is excised sharply with the Metzenbaum scissors.  The mucosa is opened with the electrocautery, providing an enterotomy for the biliary anastomosis.  The biliary anastomosis is completed between the common hepatic duct and the antimesenteric border of the Roux limb, with interrupted #4-0 Vicryl sutures. Prior to tying all sutures, a 5-French pediatric feeding tube measuring approximately 3.0 cm in length was inserted as a stent across the anastomosis. All sutures had been tied securely and divided.  The anastomosis lays without tension. There is no sign of persistent biliary leak.  A 10-French Jackson-Pratt drain is placed behind the anastomosis and brought out from the right abdominal wall. It is secured to the skin with a #3-0 nylon suture in place, and the bulb suctioned. The abdomen is irrigated copiously with warm saline, which is evacuated.  The nasogastric tube is properly positioned within the stomach.  The abdominal wall is then closed in two layers with running #1 Novofil suture.  The subcutaneous tissues are irrigated and hemostasis obtained with the electrocautery.  The skin was closed with stainless steel staples.  The sterile occlusive gauze dressings are applied.  The patient is awakened from anesthesia and brought to the recovery room in stable condition.  The patient tolerated the procedure well. DD:  04/24/00 TD:  04/28/00 Job: [id number]

## 2011-04-21 NOTE — Discharge Summary (Signed)
NAME:  [name] [name], [name] [name] NO.:  [id number]   MEDICAL RECORD NO.:  [id number]          PATIENT TYPE:  INP   LOCATION:  4702                         FACILITY:  [facility]   PHYSICIAN:  Candyce Churn, M.D.DATE OF BIRTH:  May 19, 1928   DATE OF ADMISSION:  06/23/2006  DATE OF DISCHARGE:  06/28/2006                                 DISCHARGE SUMMARY   DISCHARGE DIAGNOSES:  1.  Syncope, likely not seizure related.  2.  History of bilateral cerebral vascular accidents in left occipital lobe,      left temporal lobe, left parietal lobe, and right occipital lobe with      right homonymous hemianopsia.  3.  Occluded left posterior cerebral artery, per MRA, within the past month.  4.  History of hypertension, but now with orthostatic hypotension.  5.  Hypercholesterolemia.  6.  Type 2 diabetes mellitus.  7.  Depression.  8.  Coronary artery disease.   DISCHARGE MEDICATIONS:  1.  Glyburide 2.5 mg daily.  2.  Aspirin 325 mg daily-1/2 tablet or 162 mg daily.  3.  Effexor XR 75 mg daily.  4.  Carbamazepine 400 mg p.o. b.i.d..  5.  Plavix 75 mg daily.  6.  Omeprazole 20 mg daily.  7.  Imdur 30 mg daily.   CONSULTATIONS:  Neurology, Dr. Ellison Carwin, on June 28, 2006.   PROCEDURES:  EEG- within normal limits, performed in June 27, 2006.  This  was an awake study.   DISCHARGE LABORATORIES:  White count was 8800, hemoglobin was 9.8, platelet  count was 149,000 on June 26, 2006.  Creatinine of 1.2, potassium of 4.7,  bicarb of 24, and glucose of 110 on June 25, 2006.  LFTs were within normal  limits on June 26, 2006, except for slightly low albumin at 3.2.  Cardiac  markers on June 23, 2006 revealed a CK-MB of 1.7, troponin I of less than  0.05, and a myoglobin greater than 500, likely secondary to a fall on the  day of admission.   HOSPITAL COURSE:  [name] [name] is a very pleasant 77 year old right-handed  female with a past history of bilateral cerebrovascular  infarctions and a  long-standing history of hypertension.  She has a history of seizures and  describes them as a sudden feeling of weakness, then loss of consciousness.  She does get confusion.  She has a confusional state as well.  In the past,  it is my understanding that she has had some postictal hypersomnolence, but  this has not occurred as of late.   On the 21st, prior to admission, she had noticed a weak spell in walking  from her kitchen to her living room.  It felt like it had in the past, where  she feels like she feels very weak and then feels like she is going to have  a seizure and loses consciousness briefly.  This happened again on the day  of admission at street side, when coming out of a store.   She was admitted on June 23, 2006 for further work up.  While admitted, she was having rather marked changes in orthostatic blood  pressure, even off of her blood pressure medications, with systolic pressure  sometimes dropping from 110 to 78 or 80 and other times not dropping really  at all.   She had some nausea and vomiting within the first 24 hours of admission,  after I had increased her Tegretol to 400 mg 3 times daily and her level  came back slightly above the upper limits of the therapeutic range at 12.2,  with upper limit of therapeutic range at 12.0.   Initially Phenobarbital was added and then discontinued and ultimately she  had a neurological consult who felt that these episodes may not be seizures  at all, and could possible be secondary to her orthostatic changes.   Plan will be to discharge her home off of Benicar HCT and off of Caduet and  reassess her in 1 week.  It may be that we will have to tolerate slightly  higher systolic pressures to avoid orthostatic hypotension.   She does not have a low sodium or high potassium to suggest adrenal  insufficiency.   The patient will be discharged home on the above medications to be followed  up next week in  our office, by our nurse practitioner, Chales Abrahams Placey.  Chales Abrahams Placey has been notified of these plans.   She did not have any arrhythmias while hospitalized and did not have any  chest pain.      Candyce Churn, M.D.  Electronically Signed     RNG/MEDQ  D:  06/28/2006  T:  06/28/2006  Job:  [id number]

## 2011-04-21 NOTE — Consult Note (Signed)
NAME:  [name] [name], [name] [name]                  ACCOUNT NO.:  [id number]   MEDICAL RECORD NO.:  [id number]          PATIENT TYPE:  INP   LOCATION:  3703                         FACILITY:  [facility]   PHYSICIAN:  Michael L. Reynolds, M.D.DATE OF BIRTH:  [birth year]   DATE OF CONSULTATION:  05/27/2006  DATE OF DISCHARGE:                                   CONSULTATION   REQUESTING PHYSICIAN:  Dr. Ladell Pier   REASON FOR EVALUATION:  Visual changes.   HISTORY OF PRESENT ILLNESS:  This is the initial inpatient consultation  evaluation of this 77 year old woman with a past medical history which  includes previous stroke resulting in right hemianopsia and some aphasia as  well as a resultant epilepsy for which she takes carbamazepine.  The patient  comes to the hospital today after two discrete episodes of visual changes.  The patient due to a history of aphasia and questionable dementia is a very  difficult historian.  She tells me that last night she had a sudden episode  in which she lost her vision.  She says she was watching T.V. at the time.  She initially told me that her vision went completely black; however, she  later told me what actually happened was that she developed double vision at  that time so it is not clear precisely what happened.  In any case, this  lasted a few minutes, then resolved.  She was able to go to a neighbor's  room and get Tylenol for a headache that she had been having for a couple of  days prior to that.  She says that when she went to bed that she was feeling  okay.  Today, however, she felt a little bit unsteady on her feet as if  she was swaying.  She decided to come to the emergency room and en route  developed double vision which she described horizontal objects next to each  other.  She denies any associated slurred speech or numbness, tingling,  weakness of the extremities.  This diplopia lasted a few minutes and then  again resolved.  At this time she says she  is feeling better, is actually  inquiring about going home.  Patient said that she had bouts of double  vision before at the time that she hadher previous strokes and was therefore  concerned about this.  Patient is being admitted to the service of her  primary physician and neurologic consultation is requested for further  considerations.   PAST MEDICAL HISTORY:  She has had at least two previous strokes secondary  to her vision, one which left her with residual loss of vision in her left  field.  That stroke happened in [place] several years ago.  She apparently  developed seizures shortly after that, has been on seizure medicines for  some years.  She tells me she takes Dilantin, although on her records  indicates she takes Tegretol and, in fact, the drug in her system is  Tegretol; this demonstrated by blood levels.  She has a history of coronary  artery disease with MI,  hypertension, diabetes, depression, and  gastroesophageal reflux disease.   FAMILY/SOCIAL/REVIEW OF SYSTEMS:  As outlined in the H&P by Dr. Olena Leatherwood  which is reviewed.   MEDICATIONS:  Baby aspirin, Zoloft 50 mg daily, Aciphex, Imdur, baby  aspirin, Tegretol 40 mg b.i.d.   PHYSICAL EXAMINATION:  VITAL SIGNS:  Temperature 98.2, blood pressure  155/66, pulse 86, respirations 20, O2 saturation 98% on room air.  GENERAL:  This is an elderly, relatively healthy-appearing woman supine in  the hospital bed in no evident distress.  HEENT:  Head:  Cranium is normocephalic, atraumatic.  Oropharynx is benign.  NECK:  Supple without carotid or supraclavicular bruits.  HEART:  Regular rate and rhythm without murmurs.  NEUROLOGIC:  Mental status:  She is awake and alert.  She is oriented to a  hospital, but not the name of the hospital or the city in which it is  located.  She quickly identifies the month, but not the year.  She has  difficulty naming objects and repeating phrases and sometimes able to  following complex  commands.  Cranial nerves:  Funduscopic examination is  benign.  Left pupil is irregular to post surgical changes, otherwise equal  to reactive.  Extraocular movements are full.  She reports subjective  horizontal diplopia on leftward gaze but I do not see a definite INO or 6th  nerve palsy.  She has a dense right homonomous hemianopsia and seems to have  some visual field neglect in her left field as well, particularly lower.  There is intact conversational speech.  Facial sensation is intact to pin  prick.  Face, tongue, and palate move normally and symmetrically.  Motor:  Normal bulk and tone.  Normal strength in all tested extremity muscles.  Sensation:  She reports some questionable diminished pin prick sensation on  the right hand compared to the left, otherwise intact to light touch,  vibration, pin prick throughout.  Coordination:  She has difficulty  performing coordination due to her visual field anomalies.  There is no  definite ataxia.  Gait:  She rises fairly easily from the stretcher.  She  stands with a bit of a wide-based gait, but actually walks around pretty  normally to me.  Reflexes 2+, symmetric.  Toes are downgoing bilaterally.   LABORATORY REVIEW:  CBC with white count 4.2, hemoglobin 11.3, platelets  166,000.  CMET remarkable for an elevated glucose of 228.  Her Tegretol  level is 9.4.  Urinalysis is negative.  I personally reviewed the MRI of the  brain performed this afternoon without contrast.  The study is remarkable  for a large previous stroke involving the entirety of the left PCA territory  extending over to the anterior temporal lobe as well as an old right  occipital stroke and a couple of old cerebellar strokes as well.  However,  there appears to be no definite acute infarct.  The MRA demonstrates cut-off  of the left PCA and fetal variant in which the right PCA is supplied by the anterior circulation.  She has a relatively small basilar artery which is   supplied by both vertebral arteries.   IMPRESSION:  1.  Acute episode of horizontal diplopia and questionable transient visual      loss with at least a subjective gait disturbance.  The history is very      difficult and my history differs from that obtained by Dr. Olena Leatherwood.      Nevertheless, given the history of multiple previous posterior  circulation strokes, a posterior circulation transient ischemic attack      would be the primary diagnostic consideration.  She does not appear to      have had a definite acute infarct by imaging.  2.  Epilepsy, apparently controlled on Tegretol.  3.  History of multiple posterior circulation strokes as above.   RECOMMENDATIONS:  Will check an MRI of the neck to evaluate the health of  the vertebral arteries as well as checking carotid Dopplers given that she  does have a fetal circulation variant to a good visual field as well as 2-D  echocardiogram and telemetry monitoring.  If there is no obvious cardiac  source of emboli she will likely need a new antiplatelet therapy.  Aggrenox  would be preferred but, of note, she does have history of headaches and  therefore Plavix might be more suited for her situation.  Stroke service to  follow.  Thank you for the consultation.      Michael L. Thad Ranger, M.D.  Electronically Signed     MLR/MEDQ  D:  05/27/2006  T:  05/28/2006  Job:  [id number]   cc:   Candyce Churn, M.D.  Fax: [phone]

## 2011-04-21 NOTE — Discharge Summary (Signed)
[place]  Patient:    [name] [name], [name] [name]                           MRN: [id number] Adm. Date:  [id number] Disc. Date: [id number] Attending:  Darnelle Bos CC:         Velora Heckler, M.D.                           Discharge Summary  DISCHARGE DIAGNOSES:  1. Acute renal insufficiency secondary to prerenal azotemia secondary to poor     p.o. intake and diuretic therapy, resolved.  2. Acidosis secondary to #1, resolved.  3. Hyperkalemia secondary to acute renal insufficiency and ACE inhibition,     resolved.  4. Hyponatremia secondary to dehydration as above, resolved.  5. Status post recent biliary surgery with transection of the common duct and     formation of a Roux-en-y hepaticojejunostomy.  This unfortunately had a     persistent bile leak and she was admitted with a Jackson-Pratt drain in     place as well as a percutaneous hepatocholojejunostomy.  HISTORY OF PRESENT ILLNESS: The patient had been hospitalized from Apr 24, 2000 to May 03, 2000 with chronic cholecystitis and underwent surgery, at which time she suffered transection of the common bile duct.  She developed persistent bile leak as mentioned and a percutaneous transhepatic cholangitic catheter was placed.  She was discharged on May 03, 2000 and was ambulating and eating okay at that time, but not great, and was not taking in adequate fluids.  She continued to have bile drainage of her Jackson-Pratt drain and eating poorly, and diuretics were resumed as an outpatient.  On the afternoon of May 14, 2000 she developed some right posterior chest pain that was severe and pleuritic.  She felt clammy and weak.  She took a couple of Darvocet and when the pain did not improve she came to the emergency room at 0140 a.m. on May 15, 2000.  She was found to have a heart rate of 30 and a blood pressure of 130/26.  Laboratories revealed severe hyperkalemia and this was treated with calcium,  D-50, and Regular insulin IV.  Her bradycardia resolved with therapy for hyperkalemia.  Other diagnoses include seizure disorder, chronic, secondary to previous stroke, type 2 diabetes mellitus, and hypertension.  The patient also noted on the day of admission she was having some diarrheal stools as well.  ADMISSION LABORATORY DATA: Laboratories on admission revealed a pH of 7.22, pCO2 25, pO2 113 on 2 liters.  Sodium was 129, potassium 5.8, chloride 107, bicarbonate 12, BUN 96, creatinine 3.3.  LFTs were normal.  Lactic acid was normal at 1.2.  Anion gap was only 10.  EKG revealed marked sinus bradycardia, no ischemic changes.  HOSPITAL COURSE: The patient was vigorously hydrated with IV normal saline and treated with Kayexalate continued the day of admission.  She had renal artery Dopplers which ruled out significant renal artery stenosis.  By the second day of admission her BUN was 68 and creatinine was down to 2.1. Bicarbonate was up to 15.  A tegretol level was checked and was therapeutic at 8.5.  She had negative CPKs.  In terms of hypertension she had no hypertension in the hospital while off medications.  She had a low-grade anemia and this was felt to be secondary to her acute illness.  She had  Hemoccult negative stools.  She was seen in consultation by Dr. Darnell Level and plans were made to slowly back out the J-P drain.  She did go on May 18, 2000 for repositioning of the J-P drain and it was retracted 2 cm.  Also, the transhepatic biliary catheter was changed at that time and was up-sized to a 10 [place] with holes above and below the anastomosis such that the catheter can drain internally as well as externally.  It was confirmed that most of the contents of the hepaticojejunostomy tube were passing into the bowel with only a slight leak.  On May 20, 2000 she was feeling well and eating well.  Her J-P drain was still draining approximately 375 cc per 24 hours.  The  percutaneous hepatocholojejunostomy was draining approximately 225 cc per 24 hours.  She was continued on oral antibiotics while hospitalized and never was febrile.  DISCHARGE CONDITION: Good, with resolution of her electrolyte abnormalities and dehydration.  DISCHARGE LABORATORY DATA: Tegretol level 8.5 on May 15, 2000.  Liver function tests on May 15, 2000 were normal except for alkaline phosphatase slightly elevated at 143.  Two CPKs drawn on May 15, 2000 were negative for elevation.  Sodium on May 20, 2000 was 137, up from 129.  Potassium was 4.7, down from 5.8.  Chloride was 108.  Bicarbonate was 23, up from 12 the day of admission.  Glucose was 119.  BUN 22, creatinine 1.0 the day of discharge (BUN 96, creatinine 3.3 the day of admission).  Calcium was 8.4.  CBC on May 20, 2000 revealed a WBC of 6500, down from 11,600.  Hemoglobin had been stable at 8.5 over several days.  MCV was normal at 90.6.  RDW was 13.  Platelet count was 302,000.  Anemia was felt to be secondary to chronic disease.  Iron levels drawn during the hospitalization revealed a relatively low total iron binding capacity at 256, with iron level 85 and iron saturation 33%, which is normal. B-12 level was normal at 253, folate normal at 8.9.  FOLLOW-UP: The patient is to follow up with Dr. Darnell Level in two to three days and with Dr. Robley Fries in one to two weeks. DD:  06/05/00 TD:  06/06/00 Job: [id number]

## 2011-04-21 NOTE — Cardiovascular Report (Signed)
NAME:  MOSSIE, GILDER NO.:  [id number]   MEDICAL RECORD NO.:  [id number]                   PATIENT TYPE:  INP   LOCATION:  4735                                 FACILITY:  [facility]   PHYSICIAN:  Lesleigh Noe, M.D.            DATE OF BIRTH:  1928-11-13   DATE OF PROCEDURE:  10/08/2002  DATE OF DISCHARGE:                              CARDIAC CATHETERIZATION   INDICATIONS FOR PROCEDURE:  The patient has a history of modest coronary  disease documented by coronary angiography, March 19, 2000.  She presented  on this occasion with prolonged substernal chest discomfort, eventually  relieved with sublingual and IV nitroglycerin.  Her enzymes did not bump and  there were no ECG changes.  This study is being done to rule out progression  of coronary artery disease.  She is known to have a false-positive  Cardiolite study in the past.   PROCEDURE PERFORMED:  1. Left heart catheterization.  2. Selective coronary angiogram.  3. Left ventriculography.   DESCRIPTION OF PROCEDURE:  After informed consent, a 6 French sheath was  placed in the right femoral artery using a modified Seldinger technique.  A  6 French A2 multipurpose catheter was used for hemodynamic recordings, left  ventriculography, and selective left and right coronary angiography.  This  catheter could not adequately seat in the left main or the right coronary,  and we therefore used a #4 6 French left and right Judkins catheter for left  and right coronary angiography.  The patient tolerated the procedure without  significant complications.   RESULTS:  I:  HEMODYNAMIC DATA:  a.  Aortic pressure 153/62.  b.  Left ventricular pressure 148/7 mmHg.   II:  LEFT VENTRICULOGRAPHY:  The left ventricular demonstrates normal  overall contractility. EF is approximately 50-55%.   III:  SELECTIVE CORONARY ANGIOGRAPHY:  a.  Left main coronary artery:  Normal.  b.  Left anterior descending coronary:   The left anterior descending  coronary artery contains proximal luminal irregularities with perhaps up to  20-25% narrowing proximally.  No significant high-grade obstruction is noted  in the left anterior descending.  Two relatively large diagonal branches  arise from the mid vessel.  The ostium of the first larger diagonal contains  50% narrowing.  The ostium of the second diagonal contains 60-75% ostial  narrowing.  The second branch is relatively small but not trivial.  The LAD  wraps around the left ventricular apex.  No significant obstruction is noted  in the LAD proper.  c.  Circumflex artery:  The circumflex artery is large.  It gives origin to  three obtuse marginal branches.  The proximal circumflex contains an  eccentric 25-35% narrowing. The first and second obtuse marginals are  relatively small. The third obtuse marginal is large and also supplies the  inferolateral wall and a small PDA branch.  The AV  nodal artery arises from  the distal circumflex.  d.  Right coronary:  The right coronary artery is nondominant giving origin  to a co-PDA and it contains no significant obstruction.    CONCLUSIONS:  1. Mild to moderate coronary disease with 60-75% second diagonal which     appeared to be slightly worse than on a previous coronary angiogram.  The     first diagonal contains a 50% narrowing. There is a 25% stenosis in the     proximal circumflex, 20% stenosis in the proximal LAD and no significant     obstruction in the nondominant right.  2. Overall normal left ventricular function.  3. When compared to the prior coronary angiogram, there was no real     significant change.  Perhaps angina could be coming from the second     diagonal, but considering the anatomy this needs to be treated with     medical therapy.   PLAN:  1. Consider adding long-acting nitrate.  2. Discharge October 09, 2002.  3. Discontinue heparin and IV nitroglycerin.                                                  Lesleigh Noe, M.D.    HWS/MEDQ  D:  10/08/2002  T:  10/09/2002  Job:  [id number]   cc:   Candyce Churn, M.D.  [address] [place]  [place] [address]  Fax: [phone]   Room [phone]

## 2011-04-21 NOTE — H&P (Signed)
NAME:  IRISH, BREISCH NO.:  [id number]   MEDICAL RECORD NO.:  [id number]           PATIENT TYPE:   LOCATION:                               FACILITY:  [facility]   PHYSICIAN:  Candyce Churn, M.D.DATE OF BIRTH:  10-08-1928   DATE OF ADMISSION:  06/23/2006  DATE OF DISCHARGE:                                HISTORY & PHYSICAL   CHIEF COMPLAINT:  Syncope x2.   HISTORY OF PRESENT ILLNESS:  Mrs. Everetts is a pleasant 77 year old female who  was admitted last month for transient loss of vision felt to possibly be due  to TIAs.   She is on carbamazepine for seizure disorder felt secondary to prior  strokes.  She does have orthostatic blood pressure changes but not severe.   Yesterday she noticed a weak spell when she was walking from her kitchen  into her living room and then felt like she has felt in the past before she  is going to have a seizure and then she apparently lost consciousness  briefly.   The again today she walked across the street to a store and again felt an  odd, weak feeling and then passed out again and was brought to the [facility]  Emergency Room.   While being monitored in the emergency room her vital signs have been  stable, though she is mildly orthostatic.   She reports that she takes all of her medicines and has all of them with  her.  She thought she was taking Dilantin but she thought that the  carbamazepine was Dilantin.  She has not been on Dilantin at least for a  year and a half on checking with her pharmacy at [facility].   We are currently admitting her now to monitor her for 24-48 hours and to  check carbamazepine levels and likely increase her Tegretol to 400 mg three  times a day instead of b.i.d. and see if this is helpful.  We will also plan  to discontinue Norvasc to hopefully prevent orthostatic blood pressure  changes.   PAST MEDICAL HISTORY:  1.  Bilateral CVAs including the following areas:  Left occipital lobe,  left      temporal lobe, right parietal lobe, and right occipital lobe.  She has a      right homonomous hemianopsia.  She also has an occluded left posterior      cerebral artery as evidenced on recent MRI/MRA as below.  2.  Hypertension.  3.  Hypercholesterolemia.  4.  Type 2 diabetes - mild.  5.  GERD.  6.  Depression.  7.  Coronary artery disease.   PAST SURGICAL HISTORY:  1.  Cholecystectomy.  2.  Hysterectomy.  3.  Appendectomy.  4.  Tonsillectomy.  5.  Cataract surgery.  6.  Right mastoidectomy.  7.  Hemorrhoidectomy.   FAMILY HISTORY:  Both parents deceased from coronary artery disease.   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  She has five  children, all living out of town.  She is divorced.  No tobacco or alcohol  use.   MEDICATIONS:  Medications are as follows and she has been taking them  dutifully by history.  1.  Plavix 75 mg daily.  2.  Glyburide 2.5 mg b.i.d. but she will be taking this once daily.  3.  Aspirin 60 mg daily.  4.  AcipHex 20 mg daily but she recently has run out of this and will be      placed on Protonix 40 mg a day in the hospital.  5.  Effexor XR 75 mg q.a.m.  6.  Tegretol 400 mg p.o. b.i.d. but will increase to t.i.d. while      hospitalized, but will also check carbamazepine level to make sure that      it is within normal range or on the lower side of normal range.  7.  Benicar/HCT 20/12.5 daily.  8.  Caduet 5/10 daily, but will be discontinuing this in the hospital and      simply give simvastatin 20 mg a day for hyperlipidemia.  9.  Imdur 30 mg daily.   ALLERGIES:  No know drug allergies.   REVIEW OF SYSTEMS:  See HPI.  No recent change in bowel habits or urinary  habits.   PHYSICAL EXAMINATION:  GENERAL:  Alert female in no acute distress.  VITAL SIGNS:  Blood pressure is 142/65 lying, 116/62 sitting. Temperature is  97.4, pulse goes from 72 to 82 from lying to sitting and respiratory rate is  20 and  nonlabored.  O2 saturation is 98% on room air.  HEENT:  Benign except for she does have a right homonomous hemianopsia.  NECK:  Supple without JVD or carotid bruits.  CHEST:  Clear to auscultation.  CARDIAC:  Regular rhythm without murmurs or gallops.  ABDOMEN:  Soft, nontender, without distended bowel sounds.  Normal.  No  obvious masses.  PELVIC/RECTAL:  Exam deferred.  EXTREMITIES:  Without cyanosis, clubbing, or edema.  NEUROLOGIC:  Reveals a right homonomous hemianopsia and she moves all her  extremities relatively well.  She does have a mildly wide-based gait on  prior exam per Dr. Thad Ranger last month.  It is hard for her to perform  coordination maneuvers secondary to visual field anomalies.   LABORATORY DATA:  White count 7100, hemoglobin 11.7, platelet count 227,000,  normal differential.  Sodium 139, potassium 4.8, chloride 106, bicarb 21,  BUN 34, creatinine 1.8, glucose 143.  Calcium 9.3, total protein 7.4,  albumin 4.1, AST 28, ALT 20, alkaline phosphatase 134, total bili 0.7.  Urinalysis within normal limits.  Detect ketones at 15 mg/dl, protein 30  mg/dl.  Nitrate negative, leukocyte esterase moderate, 3-6 white cells, few  bacteria, rare epithelial cells.   Carbamazepine level was pending.   ASSESSMENT:  A 77 year old female with syncope x2 which I think may be  seizure-related.   PLAN:  Plan will be to increase carbamazepine dose if possible and to check  orthostatic blood pressures and also discontinue Norvasc.   Will also check orthostatic blood pressures and monitor for arrhythmias.   Initial cardiac enzymes reveal a myoglobin greater than 500 but she did  fall. CK-MB was only 1.7 ng/ml and troponin I was less than 0.05.      Candyce Churn, M.D.  Electronically Signed     RNG/MEDQ  D:  06/23/2006  T:  06/23/2006  Job:  [id number]   cc:   Casimiro Needle L. Thad Ranger, M.D.  Fax: [phone]

## 2011-04-21 NOTE — Discharge Summary (Signed)
[name] [name], [name] NO.:  [id number]   MEDICAL RECORD NO.:  [id number]                   PATIENT TYPE:  INP   LOCATION:  3707                                 FACILITY:  [facility]   PHYSICIAN:  Lyn Records, M.D.                DATE OF BIRTH:  [birth year]   DATE OF ADMISSION:  06/27/2003  DATE OF DISCHARGE:  06/29/2003                                 DISCHARGE SUMMARY   PHYSICIANS:  1. Primary care Anedra Penafiel:  Georgann Housekeeper, M.D.  2. Also followed by Candyce Churn, M.D.   PROCEDURES:  Adenosine Cardiolite which was negative for evidence of  ischemia.  Normal ejection fraction of 55.   DISCHARGE DIAGNOSES:  1. Chest discomfort resolved during the course of admission.  Ruled out     myocardial infarction by serial cardiac enzymes.  History of two prior     cardiac catheterizations without significant obstructive coronary artery     disease.  Ruled out for myocardial infarction this admission by negative     serial cardiac enzymes.  Adenosine Cardiolite June 29, 2003 was negative     for ischemia with normal ejection fraction.  2. History of hypertension, adequate control this admission.  3. History of diabetes mellitus type 2.  Glucose this admission was 169.  4. History of gastroesophageal reflux disease; on Nexium.  5. History of hyperlipidemia; on Lipitor.  Lipid profile this admission:     Cholesterol of 170, triglycerides 154, HDL 45, LDL of 94.  LFTs were not     obtained.  6. History of cerebrovascular accident in 2003.  7. History of seizure disorder.   PLAN:  1. The patient was discharged home in stable condition.  2. Discharge medications:     a. Glyburide 2.5 mg p.o. daily.     b. Tegretol 400 mg p.o. b.i.d.     c. Nexium 40 mg p.o. daily.     d. Norvasc 10 mg p.o. daily.     e. Hydrochlorothiazide 25 mg p.o. daily.     f. Lipitor 10 mg p.o. daily.     g. Imdur 30 mg p.o. daily.     h. Enteric-coated aspirin 81 mg p.o.  daily.  3. Activity:  As tolerated.  4. Diet:  Low salt, low fat, diabetic diet.  5. Special instructions:  The patient to contact our clinic [phone]) for     any problems or questions.  6. Patient to call Candyce Churn, M.D. to follow up within one to two     weeks post discharge.   HISTORY OF PRESENT ILLNESS:  [name] [name] is a 77 year old female with history  of recurrent episodes of chest discomfort, prior cardiac catheterization  2001 and repeated November of 2003.  Heart catheterizations did not  demonstrate significant high-grade obstructive disease and medical therapy  had been chosen as treatment option.  One to two weeks prior to this admission the patient was experiencing  intermittent chest pain which had been increasing in frequency.  She was  taking sublingual nitroglycerin with variable results.  She presented to the  emergency room with further details of hospital admission as above.   LABORATORY TEST DATA:  Chest x-ray June 27, 2003 revealed resolved  atelectatic change at the right base (compared to November 2003), stable  mild linear atelectasis left base.  Serum sodium 134, potassium 4.5,  chloride 109, glucose 169, BUN 25, creatinine 1.1.  First CK of 49, troponin-  I 0.01.  Second CK 49, troponin-I 0.02  Cholesterol 170, triglycerides 154,  HDL 45, LDL of 94.  In the emergency room, serial myoglobin as follows:  First set 276, MB of 1.4, troponin-I less than 0.05.  Second myoglobin of  215, MB of 1.2, troponin-I less than 0.05.  Third myoglobin of 208, MB of  1.4, troponin-I of less than 0.05.  Admission EKG revealed normal sinus  rhythm without acute ST-T wave changes.      Salomon Fick, N.P.                       Lyn Records, M.D.    MES/MEDQ  D:  08/07/2003  T:  08/07/2003  Job:  [id number]   cc:   Candyce Churn, M.D.  [address] [place]  [place] [address]  Fax: [phone]   Georgann Housekeeper, M.D.  [address]   [place]  [place] [address]  Fax: [phone]

## 2011-04-21 NOTE — Consult Note (Signed)
NAME:  [name] [name], [name] [name] NO.:  [id number]   MEDICAL RECORD NO.:  [id number]                   PATIENT TYPE:  INP   LOCATION:  4735                                 FACILITY:  [facility]   PHYSICIAN:  Lesleigh Noe, M.D.            DATE OF BIRTH:  1928/10/15   DATE OF CONSULTATION:  10/07/2002  DATE OF DISCHARGE:                                   CONSULTATION   REASON FOR CONSULTATION:  Recurrent chest pain.   CONCLUSIONS:  1. Recurrent chest discomfort with diaphoresis in a waxing-and-waning     pattern.  Rule out unstable angina.  2. Diabetes mellitus, non-insulin dependent.  3. Hypertension.  4. History of cerebrovascular accident, June 2003.  5. Gastroesophageal reflux disease.  6. Seizure disorder.  7. Developing anemia.   RECOMMENDATIONS:  1. Agree with Lovenox and nitroglycerin IV.  2. Serial enzymes to rule out MI.  3. Coronary angiography on October 08, 2002 p.m.  4. Serial hemoglobin levels to rule out GI bleeding.  5. Review office records.  6. Agree with aspirin one per day.   COMMENTS:  The patient is 77 years of age and has, according to her, a prior  history of coronary angiography.  I will need to find these results.  She  also underwent a recent Cardiolite study that was negative for evidence of  ischemia.  She is admitted now with recurring episodes of chest pressure  over 24 to 48 hours that has eventually been relieved with IV nitroglycerin.  She has positive risk factors including hypertension, CVA, diabetes and  hypercholesterolemia.  She denies tobacco use.  She does not drink alcohol.   SIGNIFICANT MEDICAL PROBLEMS:  See above and in addition to above, includes  history of appendectomy, history of cholecystectomy and history of total  abdominal hysterectomy.   MEDICATIONS ON ADMISSION:  1. Glyburide 2.5 mg per day.  2. Tegretol 400 mg b.i.d.  3. Nexium 40 mg per day.  4. Norvasc 10 mg per day.  5.  Hydrochlorothiazide 25 mg per day.  6. Lipitor 10 mg per day.  7. P.r.n. sublingual nitroglycerin.   PHYSICAL EXAMINATION:  GENERAL:  On exam, the patient is obese.  She is in  no acute distress.  VITAL SIGNS:  Her blood pressure is 160/70, heart rate 90, respirations 16.  HEENT:  Exam unremarkable.  Pupils are equal and reactive to light.  NECK:  Exam reveals no JVD or carotid bruits.  Thyroid is not palpable.  LUNGS:  The lungs are clear to auscultation and percussion.  CARDIAC:  Exam reveals no S4 or gallop, no murmur, no click.  EXTREMITIES:  Extremities reveal no edema.  Dorsalis pedis pulses are 2+.  Femoral pulses are 2+.  ABDOMEN:  The abdomen is soft.  No obvious liver enlargement is noted.  NEUROLOGIC:  Exam is grossly intact.   LABORATORY AND ACCESSORY CLINICAL  DATA:  EKG is normal.   CK-MB and troponin I x2 are normal.  Hemoglobin has dropped from 12.2 on  admission to 10.0 this a.m.   DISCUSSION:  With the patient's risk factors, the quality of the discomfort  and a recent negative Cardiolite, coronary angiography is indicated to  clearly define whether or not this patient has any significant coronary  disease; this will be performed on the afternoon of October 08, 2002.                                                Lesleigh Noe, M.D.    HWS/MEDQ  D:  10/07/2002  T:  10/07/2002  Job:  [id number]

## 2011-04-21 NOTE — Discharge Summary (Signed)
NAME:  [name] [name], CURL NO.:  [id number]   MEDICAL RECORD NO.:  [id number]          PATIENT TYPE:  INP   LOCATION:  5038                         FACILITY:  [facility]   PHYSICIAN:  Robert A. Thurston Hole, M.D. DATE OF BIRTH:  Jun 27, 1928   DATE OF ADMISSION:  01/27/2005  DATE OF DISCHARGE:  01/30/2005                                 DISCHARGE SUMMARY   ADMISSION DIAGNOSES:  1.  Left knee medial meniscus tear.  2.  Seizure disorder.  3.  History of myocardial infarction.  4.  Coronary artery disease.  5.  Hypertension.  6.  Non-insulin-dependent diabetes.  7.  Reflux.  8.  Cerebrovascular accidents x2, most recent being last summer.  9.  Depression.   DISCHARGE DIAGNOSES:  1.  Left knee medial meniscus tear, status post arthroscopy.  2.  Seizure disorder.  3.  History of myocardial infarction.  4.  Coronary artery disease.  5.  Hypertension.  6.  Non-insulin-dependent diabetes.  7.  Reflux.  8.  History of stroke.  9.  Depression.   HISTORY OF PRESENT ILLNESS:  The patient is a 77 year old white female who  fell, injuring her left knee many months ago.  MRI did show a medial  meniscal tear.  She tried anti-inflammatories and cortisone shots without  success.  She has pain at night and pain with twisting motions and  difficulty with her knee giving way.  She understands the risks and benefits  and possible complications of a left knee arthroscopy and is without  questions.   PROCEDURE IN-HOUSE:  The patient underwent a left knee arthroscopy by Dr.  Thurston Hole.   She was admitted postoperatively for medical management, pain management and  physical therapy.   HOSPITAL COURSE:  Postoperative day 1, hemoglobin was 9.6, she was  metabolically stable.  She had good range of motion, her dressing was clean  and dry.  Postoperative day 2 T-max was 99.0, O2 saturation was 97%.  She  was up and walking with a walker.  Her hemoglobin was 9.9.  She was  metabolically stable.   Her calf was soft.  Possible skilled nursing facility  placement was ordered.  Postoperative day 3, the patient was progressing  well, therefore, did not want to have placement in a skilled nursing  facility.  Her temperature was 97.0, her blood pressure was 116/55, she was  independent with a walker.  The surgical wounds were well approximated, she  had very little swelling and she was discharged to home in stable condition,  weightbearing as tolerated, on a regular diet.   DISCHARGE MEDICATIONS:  1.  Percocet one q.4-6h. p.r.n. pain.  2.  Glyburide 2.5 mg one p.o. daily.  3.  __________ 5/10 mg one p.o. daily.  4.  Isosorbide mononitrate 30 mg one p.o. daily.  5.  Zoloft 100 mg one p.o. daily.  6.  Carbamazepine 400 mg one p.o. daily.  7.  Benicar/HCT 20/12.5 one p.o. daily.  8.  Aciphex 20 mg one p.o. daily.  9.  Aspirin 325 mg one p.o. daily.   She has  been instructed to remove her dressing in the morning, okay to  shower, place Band-Aids over the wound, call with increased pain, increased  swelling, increased drainage, or a temperature greater than 101.  She has  followup in the office on February 02, 2005.       KS/MEDQ  D:  04/12/2005  T:  04/12/2005  Job:  [id number]

## 2011-04-21 NOTE — Discharge Summary (Signed)
NAME:  [name] [name], [name] [name] NO.:  [id number]   MEDICAL RECORD NO.:  [id number]          PATIENT TYPE:  INP   LOCATION:  3703                         FACILITY:  [facility]   PHYSICIAN:  Candyce Churn, M.D.DATE OF BIRTH:  11-02-1928   DATE OF ADMISSION:  05/27/2006  DATE OF DISCHARGE:  05/29/2006                                 DISCHARGE SUMMARY   Again Dr. Thad Ranger got a somewhat different history where she states on the  day prior to admission she had a sudden episode of losing her vision where  her vision went completely black, and then she stated that she did not lose  her vision, but had double vision.  Indication on the night prior to  admission just lasted a few minutes and resolved.  Her history did confirm  that of Dr. Bartholomew Crews, that she stated she had a headache that had started 2  days prior to her visual changes.  She also has complained of unsteadiness  on her feet, feeling as if she was swaying.   It was felt that she likely had an acute episode of horizontal diplopia with  questionable transient visual loss, at least a subjective gait disturbance.   She was admitted and workup included a carotid Dopplers and 2D echo as  above.  Brain imaging revealed no evidence of acute stroke.  The patient had  MRI/MRA performed.  There was an old occlusion at left posterior cerebral  artery as the only vascular abnormality intracranially.  The patient had no  further symptoms while hospitalized.  Was discharged on her usual medicines  with the addition of Plavix.  She was able to ambulate safely at discharge.  She was to be discharged home with home health PT and OT for further balance  training.      Candyce Churn, M.D.  Electronically Signed     RNG/MEDQ  D:  07/21/2006  T:  07/21/2006  Job:  [id number]

## 2011-04-21 NOTE — Cardiovascular Report (Signed)
[place]  Patient:    [name] [name], [name] [name]                           MRN: [id number] Proc. Date: 03/19/00 Attending:  Celso Sickle, M.D. CC:         Cardiac Catheterization Laboratory             Pearla Dubonnet, M.D.                        Cardiac Catheterization  CD:  [phone]  PROCEDURE PERFORMED: 1. Left heart catheterization. 2. Selective coronary angiography. 3. Left ventriculography.  CARDIOLOGIST:  Celso Sickle, M.D.  INDICATIONS:  Abnormal Cardiolite, myocardial perfusion study following several  episodes of chest discomfort.  DESCRIPTION OF PROCEDURE:  After an informed consent, a 6-French sheath was inserted into the right femoral artery using the modified Seldinger technique. A 6-French A2 multipurpose catheter was used for hemodynamic recordings, left ventriculography, and attempted left and right coronary angiography.  The #4 left and right 6-French Judkins catheters were used for the left and right coronary angiography respectively.  The patient tolerated the procedure without complications.  RESULTS: HEMODYNAMIC DATA: Aortic pressure:  186/73 mmHg. Left ventricular pressure:  183/18 mmHg.  LEFT VENTRICULOGRAPHY:  The left ventricle was normal in size and demonstrates normal overall contractility.  The ejection fraction is 60%.  SELECTIVE CORONARY ANGIOGRAPHY: 1. Left main coronary artery:  The left main coronary artery is normal. 2. Left anterior descending coronary artery:  The left anterior descending    coronary artery is a large vessel.  It gives origin to two large proximal    diagonal branches and a smaller third diagonal branch in the distal third    of the LAD distribution.  There is a 20%-30% proximal LAD narrowing, and    the first and second diagonals contain ostial 50% stenoses.  No significant    obstructive lesions are noted in the LAD territory. 3. Circumflex coronary artery:  The  circumflex coronary artery is a large    artery that gives origin to four obtuse marginal branches.  No significant    obstruction is noted.  There is proximal 20%-30% narrowing. 4. Right coronary artery:  The right coronary artery is a large nondominant    vessel, free of any significant obstruction.  CONCLUSIONS: 1. Minimal coronary atherosclerosis with 20%-30% proximal circumflex    narrowing, and 50%-60% stenosis in the ostium of each of the first    and second diagonal branches. 2. Overall normal left ventricular function. 3. False-positive Cardiolite study.  RECOMMENDATIONS:  No further cardiac evaluation. DD:  03/19/00 TD:  03/19/00 Job: [id number]

## 2011-04-21 NOTE — H&P (Signed)
[place]  Patient:    [name] [name], [name] [name]                           MRN: [id number] Adm. Date:  [id number] Attending:  Darnelle Bos                         History and Physical  HISTORY OF PRESENT ILLNESS:  [name] [name] is a 77 year old white female, admitted with acute renal insufficiency, hyperkalemia and bradycardia.  She was hospitalized from Apr 24, 2000 to May 03, 2000 with chronic cholecystitis and underwent surgery on Apr 24, 2000.  Laparoscopic cholecystectomy was attempted but a common bile duct injury occurred; she underwent an open cholecystectomy then and a Roux-en-Y hepaticojejunostomy.  She developed a persistent bile leak so a percutaneous transhepatic cholangiocatheter was placed.  She was discharged on May 31st and was ambulatory and eating okay; since discharge, however, she has been weak, has been eating poorly and has lost weight.  She has been drinking some fluids, mainly juices.  On the afternoon of June 11th, she developed some right posterior chest pain that was very severe and pleuritic in nature.  This continued through the evening, even after taking some Darvocet.  She became somewhat clammy and weaker and when the pain did not improve, she came to the ER; she arrived approximately 1:40.  She was evaluated upon arrival by EDP, as her blood pressure was 103/26 and her heart rate was around 30.  Labs revealed hyperkalemia and she was treated with calcium, D-50 and Regular insulin IV.  When BUN and creatinine were recognized to be acutely elevated, I was called.  When she was discharged, she was not on her usual blood pressure medicines, as her blood pressure had been good in the hospital.  Approximately one week ago, her blood pressure medications were resumed by [name] [name]; these include lisinopril 20 mg daily, Norvasc 10 mg daily and hydrochlorothiazide 12.5 mg daily.  She has also been having some diarrhea.  She  has diabetes mellitus and states that her CBGs have been "okay."  MEDICATIONS:  See above, also: 1. Tegretol 400 mg daily. 2. Aspirin daily. 3. Baycol 0.4 mg daily. 4. Glyburide 2.5 mg daily. 5. Pepcid 20 mg b.i.d. 6. Darvocet-N 100 p.r.n.  PAST MEDICAL HISTORY:  Seizure disorder.  Cerebrovascular accident. Hypertension.  Type 2 diabetes.  SURGICAL HISTORY:  Bilateral tubal ligation.  Total abdominal hysterectomy. Appendectomy.  ALLERGIES:  No known drug allergies.  SOCIAL HISTORY:  She does not consume alcohol and she does not smoke cigarettes.  She usually lives independently.  Her daughter has been helping since her surgery.  FAMILY HISTORY:  Noncontributory.  REVIEW OF SYSTEMS:  Otherwise negative.  PHYSICAL EXAMINATION:  GENERAL:  Somewhat pale in no acute distress.  VITAL SIGNS:  Blood pressure 120/40, pulse 50s in sinus mechanism.  HEENT:  The eyes have pupils which are round and react to light.  Extraocular movements are intact.  The ears are normal.  Nose and throat are unremarkable.  NECK:  Supple.  The thyroid is not enlarged or tender.  CHEST:  Clear to auscultation and percussion.  CARDIAC:  Normal S1 and S2, without an S3, S4, murmur, rub or click.  ABDOMEN:  Examination revealed a Jackson-Pratt drain in the right abdomen and a cholangiocatheter into a leg bag from the right upper quadrant.  The abdomen is  minimally tender.  EXTREMITIES:  Without cyanosis, clubbing or edema.  There is no calf tenderness.  LABORATORY AND X-RAY FINDINGS:  PCO2 25, PO2 13, pH 7.22 on 2 L of oxygen. Sodium 129, potassium 5.8, chloride 107, bicarb 12, BUN 96, creatinine 3.3, anion gap 10.  AST 20, ALT 30, alkaline phosphatase 143, bilirubin 0.4, lactic acidosis 1.2 (normal) and amylase 74.  EKG shows marked sinus bradycardia with no ischemic changes.  Chest x-ray reveals no infiltrate.  IMPRESSION: 1. Acute renal insufficiency.  I believe that this is a prerenal  azotemia    secondary to dehydration with a combination of ACE inhibitors.  I doubt    postrenal causes.  She does have a surprising degree of acidosis and her    anion gap was normal, which I would not expect if this is in fact due to    her azotemia (she is not hyperchloremic because she is also hyponatremic). 2. Acidosis secondary to #1. 3. Hyperkalemia secondary to #1 and ACE inhibitor. 4. Hyponatremia presumed secondary to #1 and dehydration. 5. Status post recent biliary surgery. 6. Seizure disorder. 7. Diabetes mellitus. 8. Hypertension. 9. Pleuritic right posterior chest pain.  I doubt this is a pulmonary    embolism, but we should consider a V/Q scan if the pain does recur.  PLAN:  I will begin vigorous rehydration after obtaining urine electrolytes. A Foley catheter has been placed. DD:  05/15/00 TD:  05/15/00 Job: [id number]

## 2011-04-21 NOTE — Consult Note (Signed)
NAME:  [name] [name], PUTZ NO.:  [id number]   MEDICAL RECORD NO.:  [id number]          PATIENT TYPE:  INP   LOCATION:  4702                         FACILITY:  [facility]   PHYSICIAN:  Deanna Artis. Hickling, M.D.DATE OF BIRTH:  1928-07-23   DATE OF CONSULTATION:  DATE OF DISCHARGE:                                   CONSULTATION   CHIEF COMPLAINT:  Syncope.   HISTORY OF PRESENT ILLNESS:  The patient is a 77 year old right handed  Caucasian woman who has a past history of bilateral cerebrovascular  infarctions with a major lesion in her left posterior cerebral artery  distribution.  The patient has a longstanding history of hypertension.  She  has also had a history of seizures however she describes her seizures as  basically loss of consciousness and falling to the ground without tonic  clonic activity, significant confusional state, tongue biting or urinary  incontinence.   The patient had 3 episodes prior to admission.  The first one on Friday, the  20th where she had an episode of loss of consciousness she thinks of 1-2  minutes duration (she was by herself).  She did not lose consciousness nor  bite her tongue.   The patient was able to get up and get around to do her activities.  On  Saturday, the 21st, the patient had gone to the store and returned home.  She felt weak and lost consciousness.  She was by herself but thinks it was  3-4 minutes.  She regained consciousness and went to call on her neighbor  who returned back to her kitchen to watch her when she had another episode  where she lost consciousness for 1-2 minutes.  There was no focal weakness.  There was no history of tonic clonic activity, headache, vomiting, shortness  of breath or chest pain.  She claims to take her medications regularly.  EMS  was contacted and the patient was brought to [facility] for  evaluation where she was seen by Dr. Kevan Ny.  She was found to have  orthostatic blood  pressure changes with a systolic still greater than 115 at  its worse.  I do not how long she was placed in the standing position.  The  patient has had recent evaluation including carotid Doppler studies which  showed right internal carotid stenosis, mild stenosis of left infernal  carotid and antegrade flow in the vetebral arteries.  MRI and MRA showed  bilateral remote infarctions with occlusion of the left posterior cerebra  artery.   The patient's carbamazepine at a non trough level was 8.2, with trough was  8.8.  What may have been a post dose was 12.2 and another trough was 9.6.   She has not had liver dysfunction nor has she had significant leukopenia.  Her platelet count has varied from 149,000 to 227,000.   The patient has not had significant problems with her telemetry in terms of  cardiac arrhythmia and had a normal sinus rhythm on her initial EKG.   She has had at least 1 fall during the  hospital stay.  There was no change  in her telemetry and her blood pressure was noted to be 122/40.  At that  time, she rolled her eyes up and she went rigid.  She was lifted to bed and  she opened her eyes (1500 hours 06/25/2006).  Again, there was no evidence  of tonic clonic activity.   Neurology was asked to see the patient to determine etiology of her  dysfunction to make recommendations for further workup and treatment.   PAST MEDICAL HISTORY:  1.  Bilateral cerebrovascular accidents including left and right occipital      lobes, left temporal, and right parietal region.  2.  Hypertension.  3.  Hypercholesteremia.  4.  Type 2 diabetes mellitus.  5.  Depression.  6.  Coronary artery disease.  7.  Possible seizure disorder.   PAST SURGICAL HISTORY:  1.  Cholecystectomy.  2.  Hysterectomy.  3.  Appendectomy.  4.  Cataract surgery.  5.  Iridectomy on the left.  6.  Right mastoidectomy.   MEDICATIONS:  1.  Plavix 75 mg per day.  2.  Aspirin 60 mg per day.  3.  Glyburide  2.5 mg twice daily.  4.  AcipHex 20 mg daily.  5.  Effexor 75 mg daily.  6.  Tegretol 400 mg twice daily.  7.  Benicar/hydrochlorothiazide 20/12.5 mg daily.  8.  Caduet 5/10 mg daily.  9.  Imdur 30 mg daily.   DRUG ALLERGIES:  None known.   FAMILY HISTORY:  Parents died from coronary artery disease.   SOCIAL HISTORY:  The patient is divorced.  Does not use tobacco or alcohol.  She has 5 children.   REVIEW OF SYSTEMS:  As recorded on the chart.  Includes peripheral vascular  disease, angina, shortness of breath with exertion, dizziness, problems with  memory, inability to read, hiatal hernia with reflux, post menopausal state.  All systems reviewed and are negative.   PHYSICAL EXAMINATION:  Vital signs: Temperature 98.6, blood pressure 104/55,  resting pulse 60, respirations 18, calculated glucose 114.  Oxygen  saturation 97% on room air.  Head, eyes, ears, nose and throat: No signs of infection.  Neck: Supple, full range of motion.  No cranial or cervical bruits.  Lungs: Clear to auscultation.  Heart: No murmurs.  Pulses normal.  Abdomen: Soft, nontender.  Bowel sounds normal.  Extremities: Well formed.  The patient has stasis changes in her legs but no  edema.  Neurologic: The patient had problems with orientation.  She thought it was  August, she first thought it was in the 1900s, finally corrected to 2007.  She could not tell me the date.  I did not test her memory in detail, but  currently her immediate memory was poor.  Her long term memory seemed to be  somewhat better.  I have concerns about this.  Her language was fluent.  She  was able to follow commands, repeat thoughts and phrases.  Cranial nerves  intact.  Acuity 2100 bilaterally.  Funduscopy showed disk margins slightly  pale disks, normal vessels, no hemorrhage or exudates.  Light reflex was  absent on the right and was diminished on the left in what was an ovoid pupil with the long axis vertically.  Extraocular  movements were full and  conjugate, symmetric facial strength, midline tongue.  She was able to  protrude her tongue, elevate her uvula in midline.  Hearing was intact.  Motor exam normal strength, tone and mass.  No  pronator drift.  The patient  had some dyspraxia with opposing her thumb and fingers but when i showed her  what I wanted her to do, she did well.  Sensation was intact.  Gait was  fairly normal.  She did run into an object on the right side because she did  not see it.   IMPRESSION:  I do not think that this person has had seizures.  I think it  would be worthwhile to perform transcranial Doppler study to look at her  central perfusion and also an EEG.  These can be followed up as an  outpatient and need not necessarily delay her departure.  She has been well  for the last couple of days.  I would not make changes in her antiepileptic  drugs well defined seizure.  If it becomes necessary, we can monitor her for  24 hours or longer but we need to make certain that she is having frequent  enough episodes to justify that study.   I appreciate the opportunity to participate in her care.  If you have  questions or I can be of assistance do not hesitate to contact me.      Deanna Artis. Sharene Skeans, M.D.  Electronically Signed     WHH/MEDQ  D:  06/26/2006  T:  06/26/2006  Job:  [id number]   cc:   Candyce Churn, M.D.  Fax: [phone]

## 2011-04-21 NOTE — Procedures (Signed)
REQUESTING PHYSICIAN:  Dr. Ellison Carwin   ATTENDING PHYSICIAN:  Dr. Johnella Moloney   CLINICAL HISTORY:  A 77 year old woman with history of old stroke and  seizures.  EEG is performed for evaluation.   DESCRIPTION:  The dominant rhythm of this tracing is a moderate amplitude  alpha rhythm of 9-10 Hz which predominates posteriorly, appears without  abnormal asymmetry, and attenuates with eye opening and closing.  Low  amplitude fast activity is seen frontally and centrally and appears without  abnormal asymmetry.  Much of the recording demonstrates muscle artifact in  the temporal areas but the recording is felt to be diagnostic.  No focal  slowing is noted and no epileptiform discharges are seen.  The patient  remained in the awake state throughout the recording.  Photic stimulation  produced weak driving responses.  Hyperventilation was not performed.  Single channel devoted to EKG revealed sinus rhythm throughout with a rate  of approximately 60 beats per minute.   CONCLUSION:  Normal study in the awake study.      Michael L. Thad Ranger, M.D.  Electronically Signed     ZOX:WRUE  D:  06/27/2006 23:10:02  T:  06/28/2006 06:50:39  Job #:  [id number]

## 2011-07-17 ENCOUNTER — Emergency Department (HOSPITAL_COMMUNITY)
Admission: EM | Admit: 2011-07-17 | Discharge: 2011-07-17 | Disposition: A | Discharge status: Home / Self Care | Payer: Medicare Other | Attending: Emergency Medicine | Admitting: Emergency Medicine

## 2011-07-17 DIAGNOSIS — I2510 Atherosclerotic heart disease of native coronary artery without angina pectoris: Secondary | ICD-10-CM | POA: Insufficient documentation

## 2011-07-17 DIAGNOSIS — E119 Type 2 diabetes mellitus without complications: Secondary | ICD-10-CM | POA: Insufficient documentation

## 2011-07-17 DIAGNOSIS — G40909 Epilepsy, unspecified, not intractable, without status epilepticus: Secondary | ICD-10-CM | POA: Insufficient documentation

## 2011-07-17 DIAGNOSIS — R040 Epistaxis: Secondary | ICD-10-CM | POA: Insufficient documentation

## 2011-07-17 DIAGNOSIS — Z79899 Other long term (current) drug therapy: Secondary | ICD-10-CM | POA: Insufficient documentation

## 2011-07-17 DIAGNOSIS — I10 Essential (primary) hypertension: Secondary | ICD-10-CM | POA: Insufficient documentation

## 2011-08-24 LAB — BASIC METABOLIC PANEL
BUN: 30 — ABNORMAL HIGH
Creatinine, Ser: 1.12
GFR calc non Af Amer: 47 — ABNORMAL LOW

## 2011-08-24 LAB — DIFFERENTIAL
Eosinophils Absolute: 0
Lymphocytes Relative: 30
Lymphs Abs: 1.3
Neutrophils Relative %: 62

## 2011-08-24 LAB — CBC
Platelets: 189
WBC: 4.4

## 2011-09-06 LAB — URINALYSIS, ROUTINE W REFLEX MICROSCOPIC
Bilirubin Urine: NEGATIVE
Glucose, UA: NEGATIVE
Hgb urine dipstick: NEGATIVE
Ketones, ur: NEGATIVE
Protein, ur: NEGATIVE
pH: 5

## 2011-09-06 LAB — DIFFERENTIAL
Basophils Absolute: 0
Basophils Absolute: 0
Basophils Relative: 1
Eosinophils Absolute: 0
Eosinophils Relative: 1
Eosinophils Relative: 1
Lymphocytes Relative: 31
Lymphocytes Relative: 33
Lymphocytes Relative: 41
Lymphs Abs: 1.1
Lymphs Abs: 1.2
Lymphs Abs: 1.4
Monocytes Relative: 9
Neutro Abs: 1.7
Neutrophils Relative %: 47
Neutrophils Relative %: 58
Neutrophils Relative %: 59

## 2011-09-06 LAB — FOLATE: Folate: 12.8

## 2011-09-06 LAB — CBC
HCT: 28.7 — ABNORMAL LOW
HCT: 30.1 — ABNORMAL LOW
HCT: 31.1 — ABNORMAL LOW
Hemoglobin: 10.6 — ABNORMAL LOW
Hemoglobin: 9.9 — ABNORMAL LOW
MCHC: 34
MCHC: 34.5
MCV: 97.3
Platelets: 183
Platelets: 183
RBC: 3.2 — ABNORMAL LOW
RDW: 12.8
RDW: 12.9
WBC: 3.4 — ABNORMAL LOW
WBC: 3.8 — ABNORMAL LOW

## 2011-09-06 LAB — URINE CULTURE: Colony Count: >=100000

## 2011-09-06 LAB — COMPREHENSIVE METABOLIC PANEL
ALT: 27
BUN: 17
CO2: 21
Calcium: 8.8
Calcium: 8.8
Chloride: 107
Creatinine, Ser: 0.98
Creatinine, Ser: 1.11
GFR calc non Af Amer: 48 — ABNORMAL LOW
Glucose, Bld: 103 — ABNORMAL HIGH
Glucose, Bld: 191 — ABNORMAL HIGH
Total Bilirubin: 0.4
Total Protein: 6.2

## 2011-09-06 LAB — IRON AND TIBC: TIBC: 290

## 2011-09-06 LAB — PROTIME-INR
INR: 1
Prothrombin Time: 13.2

## 2011-09-06 LAB — POCT CARDIAC MARKERS: Myoglobin, poc: 225

## 2011-09-06 LAB — POCT I-STAT, CHEM 8
Calcium, Ion: 1.14
Chloride: 111
Glucose, Bld: 194 — ABNORMAL HIGH
HCT: 31 — ABNORMAL LOW
Hemoglobin: 10.5 — ABNORMAL LOW
TCO2: 21

## 2011-09-06 LAB — VITAMIN B12: Vitamin B-12: 399 (ref 211–911)

## 2011-09-06 LAB — GLUCOSE, CAPILLARY

## 2011-09-06 LAB — RETICULOCYTES
Retic Count, Absolute: 25.4
Retic Ct Pct: 0.8

## 2011-09-06 LAB — TSH: TSH: 1.09

## 2011-09-06 LAB — CK TOTAL AND CKMB (NOT AT ARMC)
CK, MB: 3.2
Relative Index: 3.1 — ABNORMAL HIGH

## 2011-09-06 LAB — HEMOGLOBIN A1C
Hgb A1c MFr Bld: 6.7 — ABNORMAL HIGH
Mean Plasma Glucose: 146

## 2011-09-06 LAB — MAGNESIUM: Magnesium: 2.1

## 2011-09-06 LAB — CARDIAC PANEL(CRET KIN+CKTOT+MB+TROPI): Troponin I: 0.03

## 2011-09-06 LAB — ABO/RH: ABO/RH(D): A POS

## 2011-09-06 LAB — TYPE AND SCREEN: Antibody Screen: NEGATIVE

## 2011-09-06 LAB — LACTIC ACID, PLASMA: Lactic Acid, Venous: 2.1

## 2011-09-12 LAB — DIFFERENTIAL
Basophils Absolute: 0
Lymphocytes Relative: 12
Monocytes Absolute: 0.4
Neutro Abs: 6.9

## 2011-09-12 LAB — URINE CULTURE

## 2011-09-12 LAB — CBC
Hemoglobin: 10.9 — ABNORMAL LOW
RBC: 3.32 — ABNORMAL LOW
RDW: 13.4
WBC: 8.3

## 2011-09-12 LAB — URINALYSIS, ROUTINE W REFLEX MICROSCOPIC
Nitrite: POSITIVE — AB
Specific Gravity, Urine: 1.021
Urobilinogen, UA: 0.2

## 2011-09-12 LAB — I-STAT 8, (EC8 V) (CONVERTED LAB)
Bicarbonate: 21.3
HCT: 34 — ABNORMAL LOW
Hemoglobin: 11.6 — ABNORMAL LOW
Operator id: 295021
Sodium: 133 — ABNORMAL LOW
TCO2: 22

## 2011-09-12 LAB — URINE MICROSCOPIC-ADD ON

## 2011-09-12 LAB — POCT CARDIAC MARKERS: Troponin i, poc: <0.05

## 2011-09-14 LAB — BASIC METABOLIC PANEL
BUN: 19
BUN: 20
Calcium: 8.7
Calcium: 8.7
Creatinine, Ser: 0.9
Creatinine, Ser: 0.96
GFR calc Af Amer: >60
GFR calc non Af Amer: >60
Glucose, Bld: 138 — ABNORMAL HIGH
Sodium: 140

## 2011-09-14 LAB — CBC
HCT: 32.6 — ABNORMAL LOW
Hemoglobin: 11.1 — ABNORMAL LOW
MCHC: 33.7
MCHC: 34
MCHC: 34.2
MCV: 93.9
Platelets: 203
Platelets: 206
Platelets: 222
RBC: 3.47 — ABNORMAL LOW
RBC: 3.56 — ABNORMAL LOW
RDW: 13.5
RDW: 13.6
WBC: 3.7 — ABNORMAL LOW
WBC: 4.7

## 2011-09-14 LAB — CARBAMAZEPINE LEVEL, TOTAL
Carbamazepine Lvl: 14.1 — ABNORMAL HIGH
Carbamazepine Lvl: 4.7
Carbamazepine Lvl: 6.8

## 2011-09-14 LAB — COMPREHENSIVE METABOLIC PANEL
ALT: 15
ALT: 15
AST: 18
AST: 20
Albumin: 3.4 — ABNORMAL LOW
Albumin: 3.6
Alkaline Phosphatase: 100
Alkaline Phosphatase: 92
BUN: 20
BUN: 21
CO2: 27
Calcium: 8.7
Chloride: 102
Chloride: 102
Creatinine, Ser: 1.12
GFR calc Af Amer: 57 — ABNORMAL LOW
GFR calc non Af Amer: 47 — ABNORMAL LOW
Glucose, Bld: 118 — ABNORMAL HIGH
Potassium: 4.4
Potassium: 4.6
Sodium: 136
Sodium: 136
Total Bilirubin: 0.4
Total Bilirubin: 0.6
Total Protein: 7

## 2011-09-14 LAB — I-STAT 8, (EC8 V) (CONVERTED LAB)
Bicarbonate: 21.4
Glucose, Bld: 176 — ABNORMAL HIGH
HCT: 34 — ABNORMAL LOW
Hemoglobin: 11.6 — ABNORMAL LOW
Operator id: 257131
Sodium: 136
TCO2: 22
pCO2, Ven: 28.2 — ABNORMAL LOW

## 2011-09-14 LAB — DIFFERENTIAL
Basophils Absolute: 0
Basophils Relative: 0
Eosinophils Absolute: 0.1
Eosinophils Relative: 1
Lymphocytes Relative: 37
Lymphs Abs: 1.7
Monocytes Absolute: 0.4
Monocytes Relative: 8
Neutro Abs: 2.5
Neutrophils Relative %: 53

## 2011-09-14 LAB — IRON: Iron: 49

## 2011-09-14 LAB — FERRITIN: Ferritin: 22 (ref 10–291)

## 2011-09-14 LAB — HEMOGLOBIN A1C
Hgb A1c MFr Bld: 7.2 — ABNORMAL HIGH
Mean Plasma Glucose: 179

## 2011-09-14 LAB — APTT: aPTT: 25

## 2011-09-14 LAB — CK TOTAL AND CKMB (NOT AT ARMC)
CK, MB: 1.2
Relative Index: INVALID
Total CK: 47

## 2011-09-14 LAB — TROPONIN I: Troponin I: 0.01

## 2011-09-14 LAB — PROTIME-INR: Prothrombin Time: 13.3

## 2011-09-14 LAB — CORTISOL: Cortisol, Plasma: 14.2

## 2011-09-14 LAB — TSH: TSH: 1.371

## 2011-09-14 LAB — CK: Total CK: 44

## 2011-12-27 ENCOUNTER — Emergency Department (HOSPITAL_COMMUNITY)
Admission: EM | Admit: 2011-12-27 | Discharge: 2011-12-27 | Disposition: A | Discharge status: Skilled Nursing Facility | Payer: Medicare Other | Attending: Emergency Medicine | Admitting: Emergency Medicine

## 2011-12-27 DIAGNOSIS — R17 Unspecified jaundice: Secondary | ICD-10-CM | POA: Insufficient documentation

## 2011-12-27 DIAGNOSIS — R509 Fever, unspecified: Secondary | ICD-10-CM | POA: Insufficient documentation

## 2011-12-27 DIAGNOSIS — R5383 Other fatigue: Secondary | ICD-10-CM | POA: Insufficient documentation

## 2011-12-27 DIAGNOSIS — R51 Headache: Secondary | ICD-10-CM | POA: Insufficient documentation

## 2011-12-27 DIAGNOSIS — R5381 Other malaise: Secondary | ICD-10-CM | POA: Insufficient documentation

## 2011-12-27 DIAGNOSIS — N390 Urinary tract infection, site not specified: Secondary | ICD-10-CM | POA: Insufficient documentation

## 2011-12-27 DIAGNOSIS — R197 Diarrhea, unspecified: Secondary | ICD-10-CM | POA: Insufficient documentation

## 2011-12-27 DIAGNOSIS — R112 Nausea with vomiting, unspecified: Secondary | ICD-10-CM | POA: Insufficient documentation

## 2011-12-27 LAB — URINALYSIS, ROUTINE W REFLEX MICROSCOPIC
Glucose, UA: NEGATIVE mg/dL
Hgb urine dipstick: NEGATIVE
Protein, ur: NEGATIVE mg/dL
Specific Gravity, Urine: 1.017 (ref 1.005–1.030)
Urobilinogen, UA: 0.2 mg/dL (ref 0.0–1.0)

## 2011-12-27 LAB — CBC
HCT: 28.6 % — ABNORMAL LOW (ref 36.0–46.0)
MCV: 95.3 fL (ref 78.0–100.0)
Platelets: 163 10*3/uL (ref 150–400)
RBC: 3 MIL/uL — ABNORMAL LOW (ref 3.87–5.11)
RDW: 12.9 % (ref 11.5–15.5)
WBC: 8.8 10*3/uL (ref 4.0–10.5)

## 2011-12-27 LAB — COMPREHENSIVE METABOLIC PANEL
ALT: 24 U/L (ref 0–35)
AST: 30 U/L (ref 0–37)
Albumin: 3.5 g/dL (ref 3.5–5.2)
CO2: 24 mEq/L (ref 19–32)
Calcium: 8.8 mg/dL (ref 8.4–10.5)
Chloride: 98 mEq/L (ref 96–112)
GFR calc non Af Amer: 52 mL/min — ABNORMAL LOW (ref >90)
Sodium: 132 mEq/L — ABNORMAL LOW (ref 135–145)

## 2011-12-27 LAB — URINE MICROSCOPIC-ADD ON

## 2011-12-27 LAB — DIFFERENTIAL
Basophils Absolute: 0 10*3/uL (ref 0.0–0.1)
Lymphocytes Relative: 3 % — ABNORMAL LOW (ref 12–46)
Lymphs Abs: 0.2 10*3/uL — ABNORMAL LOW (ref 0.7–4.0)
Neutro Abs: 8.1 10*3/uL — ABNORMAL HIGH (ref 1.7–7.7)

## 2011-12-27 MED ORDER — SODIUM CHLORIDE 0.9 % IV BOLUS (SEPSIS)
1000.0000 mL | Freq: Once | INTRAVENOUS | Status: AC
  Administered 2011-12-27: 1000 mL via INTRAVENOUS

## 2011-12-27 MED ORDER — ONDANSETRON HCL 4 MG/2ML IJ SOLN
4.0000 mg | Freq: Once | INTRAMUSCULAR | Status: DC
  Filled 2011-12-27: qty 2

## 2011-12-27 MED ORDER — CEPHALEXIN 500 MG PO CAPS: 500.0000 mg | ORAL_CAPSULE | Freq: Two times a day (BID) | ORAL | Status: AC

## 2011-12-27 MED ORDER — GI COCKTAIL ~~LOC~~
30.0000 mL | Freq: Once | ORAL | Status: DC
  Filled 2011-12-27: qty 30

## 2011-12-27 MED ORDER — CEPHALEXIN 500 MG PO CAPS
500.0000 mg | ORAL_CAPSULE | Freq: Once | ORAL | Status: AC
  Administered 2011-12-27: 500 mg via ORAL
  Filled 2011-12-27: qty 1

## 2011-12-27 NOTE — ED Notes (Signed)
Patient stable upon discharged. Called report to ptar and nursing home

## 2011-12-27 NOTE — ED Notes (Signed)
WUJ:[id number]<JY> Expected date:12/27/11<BR> Expected time: 4:38 PM<BR> Means of arrival:Ambulance<BR> Comments:<BR> EMS 30 GC, 87 yof fever 103, N/v/d

## 2011-12-27 NOTE — ED Notes (Signed)
[facility] notified.  Called report to [facility], [place] Rn

## 2011-12-27 NOTE — ED Provider Notes (Signed)
History     CSN: [id number]  Arrival date & time 12/27/11  1654   First MD Initiated Contact with Patient 12/27/11 1656      Chief Complaint  Patient presents with  . Fever  . Jaundice    (Consider location/radiation/quality/duration/timing/severity/associated sxs/prior treatment) HPI This elderly patient presents from her nursing home to 2 their concern of fever, emesis, diarrhea, the patient notes that she has been in her usual state of health in the hours prior to presentation.  She gradually developed nausea and had multiple episodes of both emesis and diarrhea.  She notes that her nausea subsided spontaneously, and during its most pronounced.  There was no associated abdominal pain.  She notes that on the emergency Department arrival.  She has no complaints No past medical history on file.  No past surgical history on file.  No family history on file.  History  Substance Use Topics  . Smoking status: Not on file  . Smokeless tobacco: Not on file  . Alcohol Use: Not on file    OB History    No data available      Review of Systems  Constitutional:       HPI  HENT:       HPI otherwise negative  Eyes: Negative.   Respiratory:       HPI, otherwise negative  Cardiovascular:       HPI, otherwise nmegative  Gastrointestinal: Positive for nausea, vomiting and diarrhea. Negative for abdominal pain, blood in stool and anal bleeding.  Genitourinary:       HPI, otherwise negative  Musculoskeletal:       HPI, otherwise negative  Skin: Negative.   Neurological: Positive for weakness and headaches. Negative for syncope.    Allergies  Review of patient's allergies indicates no known allergies.  Home Medications   Current Outpatient Rx  Name Route Sig Dispense Refill  . ASPIRIN 325 MG PO TABS Oral Take 325 mg by mouth daily.    Marland Kitchen CARBAMAZEPINE 200 MG PO TABS Oral Take 400 mg by mouth 2 (two) times daily.    Marland Kitchen VITAMIN D 1000 UNITS PO TABS Oral Take 1,000 Units by  mouth 2 (two) times daily.    Marland Kitchen CLOPIDOGREL BISULFATE 75 MG PO TABS Oral Take 75 mg by mouth daily.    . GLYBURIDE 5 MG PO TABS Oral Take 2.5 mg by mouth daily with breakfast.    . ISOSORBIDE MONONITRATE ER 30 MG PO TB24 Oral Take 60 mg by mouth daily.    . OLOPATADINE HCL 0.2 % OP SOLN Both Eyes Place 1 drop into both eyes daily.    Marland Kitchen OMEPRAZOLE 20 MG PO CPDR Oral Take 20 mg by mouth daily.    Bernadette Hoit SODIUM 8.6-50 MG PO TABS Oral Take 1 tablet by mouth 2 (two) times daily.    Marland Kitchen SIMVASTATIN 20 MG PO TABS Oral Take 20 mg by mouth at bedtime.    Marland Kitchen VALSARTAN 80 MG PO TABS Oral Take 80 mg by mouth daily.      BP 148/49  Pulse 81  Temp(Src) 100.5 F (38.1 C) (Oral)  Resp 16  SpO2 99%  Physical Exam  Nursing note and vitals reviewed. Constitutional: She is oriented to person, place, and time. She appears well-developed and well-nourished. No distress.  HENT:  Head: Normocephalic and atraumatic.  Eyes: Conjunctivae are normal. Pupils are equal, round, and reactive to light.  Cardiovascular: Normal rate and regular rhythm.   Pulmonary/Chest: Effort normal.  No stridor. No respiratory distress.  Abdominal: Soft. She exhibits no distension and no mass. There is no tenderness. There is no rebound and no guarding.  Musculoskeletal: She exhibits no edema and no tenderness.  Neurological: She is alert and oriented to person, place, and time. No cranial nerve deficit. She exhibits normal muscle tone. Coordination normal.  Skin: Skin is dry. She is not diaphoretic.  Psychiatric: She has a normal mood and affect.    ED Course  Procedures (including critical care time)  Labs Reviewed - No data to display No results found.   No diagnosis found.    MDM  This 78 year old female presents with one day of fever, nausea, vomiting, diarrhea.  On exam, the patient's complaints have ceased, and she denies any complaints.  The patient has unremarkable vital signs, and a soft abdomen.   The patient's labs are reassuring, though her urinalysis demonstrates a urinary tract infection.  (7:25 PM) The patient reiterates that she has no complaints, and she continues to have a soft, non-tender abdomen.  She will be d/c w ABX for a UTI.         Gerhard Munch, MD 12/27/11 1925

## 2011-12-27 NOTE — ED Notes (Signed)
Per EMS- Patient is a resident of [facility]. Staff reported that the patient developed a fever 103.0 orally, jaundice, N/V/D. Diarrhea x 5. A 320 gauge started in the RFA.

## 2011-12-28 ENCOUNTER — Encounter (HOSPITAL_COMMUNITY): Payer: Self-pay | Admitting: Emergency Medicine

## 2012-06-18 ENCOUNTER — Inpatient Hospital Stay (HOSPITAL_COMMUNITY)
Admission: EM | Admit: 2012-06-18 | Discharge: 2012-06-21 | DRG: 244 | Disposition: A | Discharge status: Home / Self Care | Payer: Medicare Other | Attending: Interventional Cardiology | Admitting: Interventional Cardiology

## 2012-06-18 ENCOUNTER — Emergency Department (HOSPITAL_COMMUNITY): Payer: Medicare Other

## 2012-06-18 ENCOUNTER — Encounter (HOSPITAL_COMMUNITY): Payer: Self-pay | Admitting: Emergency Medicine

## 2012-06-18 DIAGNOSIS — I209 Angina pectoris, unspecified: Secondary | ICD-10-CM | POA: Diagnosis present

## 2012-06-18 DIAGNOSIS — K219 Gastro-esophageal reflux disease without esophagitis: Secondary | ICD-10-CM | POA: Diagnosis present

## 2012-06-18 DIAGNOSIS — I447 Left bundle-branch block, unspecified: Secondary | ICD-10-CM | POA: Diagnosis present

## 2012-06-18 DIAGNOSIS — I69998 Other sequelae following unspecified cerebrovascular disease: Secondary | ICD-10-CM

## 2012-06-18 DIAGNOSIS — I2510 Atherosclerotic heart disease of native coronary artery without angina pectoris: Secondary | ICD-10-CM | POA: Diagnosis present

## 2012-06-18 DIAGNOSIS — I495 Sick sinus syndrome: Secondary | ICD-10-CM | POA: Diagnosis present

## 2012-06-18 DIAGNOSIS — Z950 Presence of cardiac pacemaker: Secondary | ICD-10-CM | POA: Diagnosis present

## 2012-06-18 DIAGNOSIS — G40909 Epilepsy, unspecified, not intractable, without status epilepticus: Secondary | ICD-10-CM | POA: Diagnosis present

## 2012-06-18 DIAGNOSIS — I200 Unstable angina: Secondary | ICD-10-CM

## 2012-06-18 DIAGNOSIS — E119 Type 2 diabetes mellitus without complications: Secondary | ICD-10-CM | POA: Diagnosis present

## 2012-06-18 DIAGNOSIS — I639 Cerebral infarction, unspecified: Secondary | ICD-10-CM | POA: Diagnosis not present

## 2012-06-18 DIAGNOSIS — E785 Hyperlipidemia, unspecified: Secondary | ICD-10-CM | POA: Diagnosis present

## 2012-06-18 DIAGNOSIS — I498 Other specified cardiac arrhythmias: Principal | ICD-10-CM | POA: Diagnosis present

## 2012-06-18 DIAGNOSIS — R269 Unspecified abnormalities of gait and mobility: Secondary | ICD-10-CM | POA: Diagnosis present

## 2012-06-18 DIAGNOSIS — I10 Essential (primary) hypertension: Secondary | ICD-10-CM | POA: Diagnosis present

## 2012-06-18 HISTORY — DX: Atherosclerotic heart disease of native coronary artery without angina pectoris: I25.10

## 2012-06-18 HISTORY — DX: Left bundle-branch block, unspecified: I44.7

## 2012-06-18 HISTORY — DX: Hyperlipidemia, unspecified: E78.5

## 2012-06-18 HISTORY — DX: Unspecified convulsions: R56.9

## 2012-06-18 HISTORY — DX: Cerebral infarction, unspecified: I63.9

## 2012-06-18 HISTORY — DX: Gastro-esophageal reflux disease without esophagitis: K21.9

## 2012-06-18 LAB — COMPREHENSIVE METABOLIC PANEL
ALT: 11 U/L (ref 0–35)
AST: 14 U/L (ref 0–37)
Albumin: 3.5 g/dL (ref 3.5–5.2)
Alkaline Phosphatase: 101 U/L (ref 39–117)
Alkaline Phosphatase: 97 U/L (ref 39–117)
BUN: 43 mg/dL — ABNORMAL HIGH (ref 6–23)
CO2: 20 mEq/L (ref 19–32)
Calcium: 9 mg/dL (ref 8.4–10.5)
Chloride: 104 mEq/L (ref 96–112)
Chloride: 104 mEq/L (ref 96–112)
Creatinine, Ser: 1.1 mg/dL (ref 0.50–1.10)
GFR calc Af Amer: 53 mL/min — ABNORMAL LOW (ref >90)
GFR calc non Af Amer: 45 mL/min — ABNORMAL LOW (ref >90)
Glucose, Bld: 86 mg/dL (ref 70–99)
Potassium: 4.9 mEq/L (ref 3.5–5.1)
Potassium: 5.2 mEq/L — ABNORMAL HIGH (ref 3.5–5.1)
Sodium: 137 mEq/L (ref 135–145)
Total Bilirubin: 0.1 mg/dL — ABNORMAL LOW (ref 0.3–1.2)
Total Bilirubin: 0.1 mg/dL — ABNORMAL LOW (ref 0.3–1.2)

## 2012-06-18 LAB — CBC WITH DIFFERENTIAL/PLATELET
Basophils Absolute: 0 10*3/uL (ref 0.0–0.1)
Lymphocytes Relative: 27 % (ref 12–46)
Lymphs Abs: 1.6 10*3/uL (ref 0.7–4.0)
Neutro Abs: 3.6 10*3/uL (ref 1.7–7.7)
Neutrophils Relative %: 62 % (ref 43–77)
Platelets: 190 10*3/uL (ref 150–400)
RBC: 3.36 MIL/uL — ABNORMAL LOW (ref 3.87–5.11)
RDW: 12.7 % (ref 11.5–15.5)
WBC: 5.8 10*3/uL (ref 4.0–10.5)

## 2012-06-18 LAB — URINALYSIS, ROUTINE W REFLEX MICROSCOPIC
Ketones, ur: NEGATIVE mg/dL
Nitrite: NEGATIVE
Protein, ur: NEGATIVE mg/dL
pH: 6 (ref 5.0–8.0)

## 2012-06-18 LAB — CARDIAC PANEL(CRET KIN+CKTOT+MB+TROPI)
Relative Index: INVALID (ref 0.0–2.5)
Relative Index: INVALID (ref 0.0–2.5)
Relative Index: INVALID (ref 0.0–2.5)
Total CK: 57 U/L (ref 7–177)
Total CK: 54 U/L (ref 7–177)
Troponin I: <0.3 ng/mL (ref <0.30)

## 2012-06-18 LAB — URINE MICROSCOPIC-ADD ON

## 2012-06-18 LAB — MRSA PCR SCREENING: MRSA by PCR: POSITIVE — AB

## 2012-06-18 LAB — APTT: aPTT: 23 seconds — ABNORMAL LOW (ref 24–37)

## 2012-06-18 LAB — PROTIME-INR: INR: 1.07 (ref 0.00–1.49)

## 2012-06-18 LAB — GLUCOSE, CAPILLARY
Glucose-Capillary: 62 mg/dL — ABNORMAL LOW (ref 70–99)
Glucose-Capillary: 107 mg/dL — ABNORMAL HIGH (ref 70–99)

## 2012-06-18 MED ORDER — ALCAFTADINE 0.25 % OP SOLN: 1.0000 [drp] | Freq: Every day | OPHTHALMIC | Status: DC

## 2012-06-18 MED ORDER — HEPARIN (PORCINE) IN NACL 100-0.45 UNIT/ML-% IJ SOLN
900.0000 [IU]/h | INTRAMUSCULAR | Status: DC
  Administered 2012-06-18 – 2012-06-19 (×2): 900 [IU]/h via INTRAVENOUS
  Filled 2012-06-18 (×3): qty 250

## 2012-06-18 MED ORDER — GLYBURIDE 2.5 MG PO TABS
2.5000 mg | ORAL_TABLET | Freq: Every day | ORAL | Status: DC
  Administered 2012-06-21: 2.5 mg via ORAL
  Filled 2012-06-18 (×4): qty 1

## 2012-06-18 MED ORDER — NITROGLYCERIN 0.4 MG SL SUBL: 0.4000 mg | SUBLINGUAL_TABLET | SUBLINGUAL | Status: DC | PRN

## 2012-06-18 MED ORDER — NITROGLYCERIN IN D5W 200-5 MCG/ML-% IV SOLN: 2.0000 ug/min | INTRAVENOUS | Status: DC

## 2012-06-18 MED ORDER — CLOPIDOGREL BISULFATE 75 MG PO TABS
75.0000 mg | ORAL_TABLET | Freq: Every day | ORAL | Status: DC
  Administered 2012-06-19 – 2012-06-21 (×3): 75 mg via ORAL
  Filled 2012-06-18 (×3): qty 1

## 2012-06-18 MED ORDER — CARBAMAZEPINE 200 MG PO TABS
400.0000 mg | ORAL_TABLET | Freq: Two times a day (BID) | ORAL | Status: DC
  Administered 2012-06-18 – 2012-06-21 (×6): 400 mg via ORAL
  Filled 2012-06-18 (×7): qty 2

## 2012-06-18 MED ORDER — IRBESARTAN 300 MG PO TABS
300.0000 mg | ORAL_TABLET | Freq: Every day | ORAL | Status: DC
  Administered 2012-06-19 – 2012-06-21 (×3): 300 mg via ORAL
  Filled 2012-06-18 (×3): qty 1

## 2012-06-18 MED ORDER — ACETAMINOPHEN 325 MG PO TABS: 650.0000 mg | ORAL_TABLET | ORAL | Status: DC | PRN

## 2012-06-18 MED ORDER — OLOPATADINE HCL 0.1 % OP SOLN
1.0000 [drp] | Freq: Every day | OPHTHALMIC | Status: DC
  Administered 2012-06-20 – 2012-06-21 (×2): 1 [drp] via OPHTHALMIC
  Filled 2012-06-18: qty 5

## 2012-06-18 MED ORDER — SIMVASTATIN 20 MG PO TABS
20.0000 mg | ORAL_TABLET | Freq: Every day | ORAL | Status: DC
  Administered 2012-06-18 – 2012-06-20 (×3): 20 mg via ORAL
  Filled 2012-06-18 (×4): qty 1

## 2012-06-18 MED ORDER — ONDANSETRON HCL 4 MG/2ML IJ SOLN: 4.0000 mg | Freq: Four times a day (QID) | INTRAMUSCULAR | Status: DC | PRN

## 2012-06-18 MED ORDER — ALPRAZOLAM 0.25 MG PO TABS: 0.1250 mg | ORAL_TABLET | Freq: Three times a day (TID) | ORAL | Status: DC | PRN

## 2012-06-18 MED ORDER — CHLORHEXIDINE GLUCONATE CLOTH 2 % EX PADS
6.0000 | MEDICATED_PAD | Freq: Every day | CUTANEOUS | Status: DC
  Administered 2012-06-20: 6 via TOPICAL

## 2012-06-18 MED ORDER — MOMETASONE FUROATE 0.1 % EX OINT: 1.0000 "application " | TOPICAL_OINTMENT | Freq: Every day | CUTANEOUS | Status: DC

## 2012-06-18 MED ORDER — PANTOPRAZOLE SODIUM 40 MG PO TBEC
40.0000 mg | DELAYED_RELEASE_TABLET | Freq: Every day | ORAL | Status: DC
  Administered 2012-06-19 – 2012-06-21 (×2): 40 mg via ORAL
  Filled 2012-06-18 (×2): qty 1

## 2012-06-18 MED ORDER — ISOSORBIDE MONONITRATE ER 30 MG PO TB24
30.0000 mg | ORAL_TABLET | Freq: Every day | ORAL | Status: DC
  Administered 2012-06-19 – 2012-06-21 (×3): 30 mg via ORAL
  Filled 2012-06-18 (×3): qty 1

## 2012-06-18 MED ORDER — HEPARIN BOLUS VIA INFUSION
4000.0000 [IU] | Freq: Once | INTRAVENOUS | Status: AC
  Administered 2012-06-18: 4000 [IU] via INTRAVENOUS
  Filled 2012-06-18: qty 4000

## 2012-06-18 MED ORDER — ASPIRIN 325 MG PO TABS
325.0000 mg | ORAL_TABLET | Freq: Every day | ORAL | Status: DC
  Administered 2012-06-19 – 2012-06-21 (×3): 325 mg via ORAL
  Filled 2012-06-18 (×3): qty 1

## 2012-06-18 MED ORDER — MUPIROCIN 2 % EX OINT
1.0000 "application " | TOPICAL_OINTMENT | Freq: Two times a day (BID) | CUTANEOUS | Status: DC
  Administered 2012-06-18 – 2012-06-20 (×5): 1 via NASAL
  Filled 2012-06-18: qty 22

## 2012-06-18 NOTE — H&P (Addendum)
Admit date: 06/18/2012 Referring Physician  Dr. Manus Gunning Primary Cardiologist  Dr. Verdis Prime Chief complaint/reason for admission:  Chest pain  HPI: This is an 78yo WF with a history of CAD, HTN, DM, GERD who presented to the ER with complaints of chest pain.  The chest pain started about 4 days ago which has been off and on.  She states that it feels like a pressure sensation located in the epigastric region.  She gets SOB with the CP as well as with exertion and she has had to stop walking.  The chest pressure occurs whenever she walks and it resolves when she rests.  She gets diaphoretic but denies any N/V.  She denies any dizziness or palpitations.  Currently she is pain free but earlier this am she was walking and became very SOB with pressure in her chest and her Assisted Living called EMS.  The patient appears to have some dementia.  She stated that she did not have any surgery in the past but then on her hospital records has had many surgeries.  She also would ask questions frequently and then re-ask the same question.    PMH:    Past Medical History  Diagnosis Date  . Diabetes mellitus   . Coronary artery disease with 60-75% diagonal #2, 50% diagonal #1, 20% proximal LAD, 25% proximal left Circumflex, normal LVF  2003  . Hypertension   . Acid reflux    LBBB    hyperlipidemia    History of TIA's with bilateral CVA's involving occipital lobes, left temporal and right parietal region   . Seizures       PSH:    Cholecystectomy,   Hysterectomy   Appendectomy   cataract surgery   Iridectomy    right and left mastoidectomy  ALLERGIES:   Review of patient's allergies indicates no known allergies.  Prior to Admit Meds:   (Not in a hospital admission) Family HX:   History reviewed. No pertinent family history. Social HX:    History   Social History  . Marital Status: Divorced    Spouse Name: N/A    Number of Children: N/A  . Years of Education: N/A   Occupational History  .  Not on file.   Social History Main Topics  . Smoking status: Never Smoker   . Smokeless tobacco: Not on file  . Alcohol Use: No  . Drug Use:   . Sexually Active:    Other Topics Concern  . Not on file   Social History Narrative  . No narrative on file     ROS:  All 11 ROS were addressed and are negative except what is stated in the HPI  PHYSICAL EXAM Filed Vitals:   06/18/12 1200  BP: 153/46  Pulse: 75  Temp:   Resp:    General: Well developed, well nourished, in no acute distress Head: Eyes PERRLA, No xanthomas.   Normal cephalic and atramatic  Lungs:   Clear bilaterally to auscultation and percussion. Heart:   HRRR S1 S2 Pulses are 2+ & equal.            No carotid bruit. No JVD.  No abdominal bruits. No femoral bruits. Abdomen: Bowel sounds are positive, abdomen soft and non-tender without masses Extremities:   No clubbing, cyanosis or edema.  DP +1 Neuro: Alert and oriented X 3. Psych:  Good affect, responds appropriately   Labs:   Lab Results  Component Value Date   WBC 5.8 06/18/2012  HGB 10.7* 06/18/2012   HCT 32.4* 06/18/2012   MCV 96.4 06/18/2012   PLT 190 06/18/2012    Lab 06/18/12 1117  NA 137  K 4.9  CL 104  CO2 20  BUN 43*  CREATININE 1.12*  CALCIUM 9.0  PROT 7.5  BILITOT 0.1*  ALKPHOS 101  ALT 11  AST 14  GLUCOSE 164*   Lab Results  Component Value Date   CKTOTAL 57 06/18/2012   CKMB 3.4 06/18/2012   TROPONINI <0.30 06/18/2012   No results found for this basename: PTT   Lab Results  Component Value Date   INR 1.01 06/18/2012   INR 1.05 07/09/2010   INR 1.0 08/09/2008     Lab Results  Component Value Date   CHOL  Value: 163        ATP III CLASSIFICATION:  <200     mg/dL   Desirable  811-914  mg/dL   Borderline High  >=782    mg/dL   High        08/08/6212   CHOL  Value: 136        ATP III CLASSIFICATION:  <200     mg/dL   Desirable  086-578  mg/dL   Borderline High  >=469    mg/dL   High        05/06/9527   Lab Results  Component Value  Date   HDL 61 02/10/2011   HDL 47 07/10/2010   Lab Results  Component Value Date   LDLCALC  Value: 86        Total Cholesterol/HDL:CHD Risk Coronary Heart Disease Risk Table                     Men   Women  1/2 Average Risk   3.4   3.3  Average Risk       5.0   4.4  2 X Average Risk   9.6   7.1  3 X Average Risk  23.4   11.0        Use the calculated Patient Ratio above and the CHD Risk Table to determine the patient's CHD Risk.        ATP III CLASSIFICATION (LDL):  <100     mg/dL   Optimal  413-244  mg/dL   Near or Above                    Optimal  130-159  mg/dL   Borderline  010-272  mg/dL   High  >536     mg/dL   Very High 05/07/4033   LDLCALC  Value: 68        Total Cholesterol/HDL:CHD Risk Coronary Heart Disease Risk Table                     Men   Women  1/2 Average Risk   3.4   3.3  Average Risk       5.0   4.4  2 X Average Risk   9.6   7.1  3 X Average Risk  23.4   11.0        Use the calculated Patient Ratio above and the CHD Risk Table to determine the patient's CHD Risk.        ATP III CLASSIFICATION (LDL):  <100     mg/dL   Optimal  742-595  mg/dL   Near or Above  Optimal  130-159  mg/dL   Borderline  409-811  mg/dL   High  >914     mg/dL   Very High 06/11/2955   Lab Results  Component Value Date   TRIG 79 02/10/2011   TRIG 103 07/10/2010   Lab Results  Component Value Date   CHOLHDL 2.7 02/10/2011   CHOLHDL 2.9 07/10/2010   No results found for this basename: LDLDIRECT      Radiology:  *RADIOLOGY REPORT*  Clinical Data: Chest pain, hypertension, diabetes  CHEST - 2 VIEW  Comparison: January 23, 2011  Findings: The cardiac silhouette, mediastinum, pulmonary  vasculature are within normal limits. Both lungs are clear.  There is no acute bony abnormality.  IMPRESSION:  There is no evidence of acute cardiac or pulmonary process.  Original Report Authenticated By: Brandon Melnick, M.D.   EKG:  Sinus bradycardia with ? Intermittent junctional rhythm with  LBBB  ASSESSMENT:  1.  Unstable angina pectoris 2.  CAD s/p cath 2003 with borderline branch vessel disease 3.  DM 4.  Hypertension 5.  Dyslipidemia 6.  Seizure disorder 7.  History of TIA's and CVA's 8.  Chronic LBBB 9.  GERD 9.  Bradycardia with ? Junctional rhythm and LBBB 10.  ? Mild dementia  PLAN:   1.  Admit to tele bed 2.  Cycle cardiac enzymes 3.  IV Heparin gtt per pharmacy 4.  IV NTG gtt 5.  ASA/Plavix daily 6.  Continue statin/ARB 7.  No beta blocker secondary to bradycardia 8.  Discussed with Dr. Katrinka Blazing and given her confusion and probable dementia, if cardiac enzymes negative x 3 then plan Lexiscan Cardiolyte in am.  Cath only if large myocardial territory with ischemia, otherwise medical management with uptitration of nitrates.   9.  NPO after MN 10. Follow telemetry due to bradycardia

## 2012-06-18 NOTE — ED Notes (Signed)
Pt states she walks for exercise every day and for the past week when she walks she has had cp and sob when she rests it goes away. States she did nort tell any one and today she asked for nitro and they called ems pt aaox4 has in22 rt hannd

## 2012-06-18 NOTE — Progress Notes (Signed)
Pt cbg 62, pt eating supper/charbohydrates, upon recheck cbg 107 will monitor patient. Dreydon Cardenas, Randall An RN

## 2012-06-18 NOTE — Progress Notes (Signed)
ANTICOAGULATION CONSULT NOTE - Initial Consult  Pharmacy Consult for Heparin Indication: chest pain/ACS  No Known Allergies  Patient Measurements: Height: 5\' 5"  (165.1 cm) Weight: 172 lb 6.4 oz (78.2 kg) IBW/kg (Calculated) : 57  Heparin Dosing Weight: 73.3 kg  Vital Signs: Temp: 97.8 F (36.6 C) (07/16 1435) Temp src: Oral (07/16 1042) BP: 162/75 mmHg (07/16 1435) Pulse Rate: 139  (07/16 1400)  Labs:  Basename 06/18/12 1118 06/18/12 1117  HGB -- 10.7*  HCT -- 32.4*  PLT -- 190  APTT -- --  LABPROT -- 13.5  INR -- 1.01  HEPARINUNFRC -- --  CREATININE -- 1.12*  CKTOTAL 57 --  CKMB 3.4 --  TROPONINI <0.30 --    Estimated Creatinine Clearance: 40.7 ml/min (by C-G formula based on Cr of 1.12).   Medical History: Past Medical History  Diagnosis Date  . Diabetes mellitus   . Hypertension   . Acid reflux   . Seizures   . Coronary artery disease 2003    60-75%D2, 50% D1, 25% left circ, 20% LAD, normal LVF  . Dyslipidemia   . Stroke   . LBBB (left bundle branch block)     Assessment: 78 yo female with a history of CAD who presents to the ED with chest pain on and off for 4 days that is associated with exertion and diaphoresis. Plan is for [facility] tomorrow morning if troponins are negative. Pharmacy consulted to begin heparin. Baseline H/H are low, platelets are normal.   Goal of Therapy:  Heparin level 0.3-0.7 units/ml Monitor platelets by anticoagulation protocol: Yes   Plan:  1. Heparin 4000 units IV bolus then infuse at 900 units/hr 2. Heparin level 8 hours after started 3. Daily heparin level and CBC while on heparin 4. Monitor for s/sx of bleeding   [facility], [address].D., BCPS Clinical Pharmacist Pager: [phone] 06/18/2012 2:38 PM

## 2012-06-18 NOTE — Progress Notes (Signed)
Patient heart rate 34-46 on montior. Ekg obtained and Dr Mayford Knife made aware no new orders received. Will continue to monitor patient . Lamir Racca, Randall An RN

## 2012-06-18 NOTE — ED Provider Notes (Signed)
History     CSN: [id number]  Arrival date & time 06/18/12  1031   First MD Initiated Contact with Patient 06/18/12 1054      Chief Complaint  Patient presents with  . Chest Pain    (Consider location/radiation/quality/duration/timing/severity/associated sxs/prior treatment) HPI Comments: Patient presents with episodic substernal chest pain and she's had intermittently for the past 3 days. It comes on with exertion and is associated with shortness of breath. Improves with rest. Associated with diaphoresis and shortness of breath. She has a history of CAD but denies having a heart attack. She had a stent in her heart. Pain pain does not radiate but stays in the left side of her chest. Does not go to her arm, neck or jaw. She denies history of chest pain the past.  The history is provided by the patient and the EMS personnel.    Past Medical History  Diagnosis Date  . Diabetes mellitus   . Hypertension   . Acid reflux   . Seizures   . Coronary artery disease 2003    60-75%D2, 50% D1, 25% left circ, 20% LAD, normal LVF  . Dyslipidemia   . Stroke   . LBBB (left bundle branch block)     Past Surgical History  Procedure Date  . Cholecystectomy   . Abdominal hysterectomy   . Appendectomy   . Iridectomy   . Mastoidectomy   . Eye surgery     cataracts/ iridectomy    History reviewed. No pertinent family history.  History  Substance Use Topics  . Smoking status: Never Smoker   . Smokeless tobacco: Not on file  . Alcohol Use: No    OB History    Grav Para Term Preterm Abortions TAB SAB Ect Mult Living                  Review of Systems  Constitutional: Positive for fever, activity change and fatigue. Negative for appetite change.  Respiratory: Positive for chest tightness and shortness of breath.   Cardiovascular: Positive for chest pain.  Genitourinary: Negative for dysuria and hematuria.  Musculoskeletal: Negative for back pain.  Neurological: Negative for  dizziness, weakness and headaches.    Allergies  Review of patient's allergies indicates no known allergies.  Home Medications   No current outpatient prescriptions on file.  BP 162/75  Pulse 139  Temp 97.8 F (36.6 C) (Oral)  Resp 18  Ht 5\' 5"  (1.651 m)  Wt 172 lb 6.4 oz (78.2 kg)  BMI 28.69 kg/m2  SpO2 98%  Physical Exam  Constitutional: She is oriented to person, place, and time. She appears well-developed and well-nourished. No distress.  HENT:  Head: Normocephalic and atraumatic.  Mouth/Throat: Oropharynx is clear and moist. No oropharyngeal exudate.  Eyes: Conjunctivae and EOM are normal. Pupils are equal, round, and reactive to light.  Neck: Normal range of motion. Neck supple.  Cardiovascular: Normal rate and normal heart sounds.   No murmur heard.      irregular rhythm  Pulmonary/Chest: Effort normal and breath sounds normal. No respiratory distress. She exhibits no tenderness.  Abdominal: Soft. There is no tenderness. There is no rebound and no guarding.  Musculoskeletal: Normal range of motion. She exhibits no edema and no tenderness.  Neurological: She is alert and oriented to person, place, and time. No cranial nerve deficit.  Skin: Skin is warm.    ED Course  Procedures (including critical care time)  Labs Reviewed  CBC WITH DIFFERENTIAL - Abnormal; Notable  for the following:    RBC 3.36 (*)     Hemoglobin 10.7 (*)     HCT 32.4 (*)     All other components within normal limits  COMPREHENSIVE METABOLIC PANEL - Abnormal; Notable for the following:    Glucose, Bld 164 (*)     BUN 43 (*)     Creatinine, Ser 1.12 (*)     Total Bilirubin 0.1 (*)     GFR calc non Af Amer 45 (*)     GFR calc Af Amer 52 (*)     All other components within normal limits  URINALYSIS, ROUTINE W REFLEX MICROSCOPIC - Abnormal; Notable for the following:    APPearance CLOUDY (*)     Hgb urine dipstick TRACE (*)     Leukocytes, UA SMALL (*)     All other components within  normal limits  URINE MICROSCOPIC-ADD ON - Abnormal; Notable for the following:    Bacteria, UA FEW (*)     All other components within normal limits  APTT - Abnormal; Notable for the following:    aPTT 23 (*)     All other components within normal limits  COMPREHENSIVE METABOLIC PANEL - Abnormal; Notable for the following:    Potassium 5.2 (*)     BUN 43 (*)     Total Bilirubin 0.1 (*)     GFR calc non Af Amer 46 (*)     GFR calc Af Amer 53 (*)     All other components within normal limits  PROTIME-INR  CARDIAC PANEL(CRET KIN+CKTOT+MB+TROPI)  CARDIAC PANEL(CRET KIN+CKTOT+MB+TROPI)  PROTIME-INR  CARDIAC PANEL(CRET KIN+CKTOT+MB+TROPI)  CARDIAC PANEL(CRET KIN+CKTOT+MB+TROPI)  TSH  HEMOGLOBIN A1C  HEPARIN LEVEL (UNFRACTIONATED)   Dg Chest 2 View  06/18/2012  *RADIOLOGY REPORT*  Clinical Data: Chest pain, hypertension, diabetes  CHEST - 2 VIEW  Comparison: January 23, 2011  Findings: The cardiac silhouette, mediastinum, pulmonary vasculature are within normal limits.  Both lungs are clear. There is no acute bony abnormality.  IMPRESSION: There is no evidence of acute cardiac or pulmonary process.  Original Report Authenticated By: Brandon Melnick, M.D.     1. Unstable angina       MDM  Chest pain and SOB on exertion that have been intermittent for the past 3 days.  Resolve with rest.  History concerning for unstable angina.  EKG without acute changes,   Heparin, nitro gtt.  Admission d/w Dr. Mayford Knife.   ,. Date: 06/18/2012  Rate: 52  Rhythm: atrial fibrillation  QRS Axis: normal  Intervals: normal  ST/T Wave abnormalities: normal  Conduction Disutrbances:none  Narrative Interpretation: Lateral ST depressions  Old EKG Reviewed: changes noted    Glynn Octave, MD 06/18/12 1557

## 2012-06-19 ENCOUNTER — Inpatient Hospital Stay (HOSPITAL_COMMUNITY): Payer: Medicare Other

## 2012-06-19 ENCOUNTER — Other Ambulatory Visit (HOSPITAL_COMMUNITY): Payer: Medicare Other

## 2012-06-19 ENCOUNTER — Other Ambulatory Visit: Payer: Self-pay

## 2012-06-19 DIAGNOSIS — I498 Other specified cardiac arrhythmias: Principal | ICD-10-CM

## 2012-06-19 LAB — GLUCOSE, CAPILLARY: Glucose-Capillary: 91 mg/dL (ref 70–99)

## 2012-06-19 LAB — CARDIAC PANEL(CRET KIN+CKTOT+MB+TROPI)
CK, MB: 3.3 ng/mL (ref 0.3–4.0)
Relative Index: INVALID (ref 0.0–2.5)
Troponin I: <0.3 ng/mL (ref <0.30)

## 2012-06-19 LAB — CBC
Hemoglobin: 11 g/dL — ABNORMAL LOW (ref 12.0–15.0)
MCH: 32 pg (ref 26.0–34.0)
RBC: 3.44 MIL/uL — ABNORMAL LOW (ref 3.87–5.11)

## 2012-06-19 LAB — HEMOGLOBIN A1C
Hgb A1c MFr Bld: 6.3 % — ABNORMAL HIGH (ref <5.7)
Mean Plasma Glucose: 134 mg/dL — ABNORMAL HIGH (ref <117)

## 2012-06-19 MED ORDER — TECHNETIUM TC 99M TETROFOSMIN IV KIT
30.0000 | PACK | Freq: Once | INTRAVENOUS | Status: AC | PRN
  Administered 2012-06-19: 30 via INTRAVENOUS

## 2012-06-19 MED ORDER — SODIUM CHLORIDE 0.45 % IV SOLN: INTRAVENOUS | Status: DC

## 2012-06-19 MED ORDER — ATROPINE SULFATE 0.1 MG/ML IJ SOLN
0.5000 mg | INTRAMUSCULAR | Status: DC | PRN
  Filled 2012-06-19: qty 5

## 2012-06-19 MED ORDER — CHLORHEXIDINE GLUCONATE 4 % EX LIQD
60.0000 mL | Freq: Once | CUTANEOUS | Status: DC
  Filled 2012-06-19: qty 60

## 2012-06-19 MED ORDER — TECHNETIUM TC 99M TETROFOSMIN IV KIT
10.0000 | PACK | Freq: Once | INTRAVENOUS | Status: AC | PRN
  Administered 2012-06-19: 10 via INTRAVENOUS

## 2012-06-19 MED ORDER — SODIUM CHLORIDE 0.9 % IR SOLN
80.0000 mg | Status: DC
  Filled 2012-06-19: qty 2

## 2012-06-19 MED ORDER — CEFAZOLIN SODIUM-DEXTROSE 2-3 GM-% IV SOLR
2.0000 g | INTRAVENOUS | Status: DC
  Filled 2012-06-19: qty 50

## 2012-06-19 MED ORDER — SODIUM CHLORIDE 0.9 % IJ SOLN
3.0000 mL | Freq: Two times a day (BID) | INTRAMUSCULAR | Status: DC
  Administered 2012-06-19: 3 mL via INTRAVENOUS

## 2012-06-19 MED ORDER — SODIUM CHLORIDE 0.9 % IV SOLN: 250.0000 mL | INTRAVENOUS | Status: DC

## 2012-06-19 MED ORDER — SODIUM CHLORIDE 0.9 % IJ SOLN: 3.0000 mL | INTRAMUSCULAR | Status: DC | PRN

## 2012-06-19 MED ORDER — CHLORHEXIDINE GLUCONATE 4 % EX LIQD
60.0000 mL | Freq: Once | CUTANEOUS | Status: AC
  Administered 2012-06-19: 4 via TOPICAL
  Filled 2012-06-19: qty 60

## 2012-06-19 NOTE — Consult Note (Signed)
ELECTROPHYSIOLOGY CONSULT NOTE  Patient ID: [name] [name] MRN: [id number], DOB/AGE: 1928-09-30   Admit date: 06/18/2012 Date of Consult: 06/19/2012  Primary Physician: Marden Noble, MD Primary Cardiologist: Verdis Prime, MD Reason for Consultation: Bradycardia  History of Present Illness [name] [name] is a pleasant 78 year old woman with CAD, normal LV function, HTN, DM, seizure disorder, prior CVA and dementia who has been admitted with exertional dyspnea which has been ongoing x 3 days. This is new for her. Her history is somewhat limited due to her dementia; however, she reports SOB with walking daily x 3 days. She reported exertional chest pain but denies this currently. She denies palpitations, dizziness, near syncope or syncope. She has no history of valvular heart disease or CHF. She is not on any AV nodal blocking agents.   Past Medical History  Diagnosis Date  . Diabetes mellitus   . Hypertension   . Acid reflux   . Seizures   . Coronary artery disease 2003    60-75%D2, 50% D1, 25% left circ, 20% LAD, normal LVF  . Dyslipidemia   . Stroke   . LBBB (left bundle branch block)     Past Surgical History  Procedure Date  . Cholecystectomy   . Abdominal hysterectomy   . Appendectomy   . Iridectomy   . Mastoidectomy   . Eye surgery     cataracts/ iridectomy    Allergies/Intolerances No Known Allergies  Inpatient Medications . aspirin  325 mg Oral Daily  . carbamazepine  400 mg Oral BID  . chlorhexidine gluconate cloth  6 each Topical Q0600  . clopidogrel  75 mg Oral Daily  . glyburide  2.5 mg Oral Q breakfast  . heparin  4,000 Units Intravenous Once  . irbesartan  300 mg Oral Daily  . isosorbide mononitrate  30 mg Oral Daily  . mupirocin ointment  1 application Nasal BID  . olopatadine  1 drop Both Eyes Daily  . pantoprazole  40 mg Oral Q1200  . simvastatin  20 mg Oral QHS   . heparin 900 Units/hr (06/18/12 1620)   Family History Unable to obtain.   Social  History Social History  . Marital Status: Divorced   Social History Main Topics  . Smoking status: Never Smoker   . Smokeless tobacco: Never Used  . Alcohol Use: No  . Drug Use: No  . Sexually Active: No   Social History Narrative  . She resides in a skilled nursing facility. Patient states she does not have a healthcare POA but her daughter, who lives in [place], assists with medical decisions.    Review of Systems General:  No chills, fever, night sweats or weight changes  Cardiovascular:  No chest pain, edema, orthopnea, palpitations, paroxysmal nocturnal dyspnea Dermatological: No rash, lesions or masses Respiratory: No cough, dyspnea Urologic: No hematuria, dysuria Abdominal:   No nausea, vomiting, diarrhea, bright red blood per rectum, melena, or hematemesis Neurologic:  No visual changes, weakness, changes in mental status All other systems reviewed and are otherwise negative except as noted above.  Physical Exam Blood pressure 185/54, pulse 42, temperature 97.1 F (36.2 C), temperature source Oral, resp. rate 18, height 5\' 5"  (1.651 m), weight 172 lb 6.4 oz (78.2 kg), SpO2 98.00%.  General: Well developed, well appearing 78 year old female in no acute distress. HEENT: Normocephalic, atraumatic. EOMs intact. Sclera nonicteric. Oropharynx clear.  Neck: Supple without bruits. No JVD. Lungs:  Respirations regular and unlabored, CTA bilaterally. No wheezes, rales or rhonchi.  Heart: Bradycardic. S1, S2 present. No murmurs, rub, S3 or S4. Abdomen: Soft, non-tender, non-distended. BS present x 4 quadrants. No hepatosplenomegaly.  Extremities: No clubbing, cyanosis or edema. DP/PT/Radials 2+ and equal bilaterally. Psych: Normal affect. Neuro: Alert and oriented to person and place and idenfied president and she knew i was there to talk about pacemaker  Moves all extremities spontaneously.   Labs  [facility] 06/19/12 0205 06/18/12 2031 06/18/12 1448 06/18/12 1118  CKTOTAL 50 46 54  57  CKMB 3.3 3.3 3.4 3.4  TROPONINI <0.30 <0.30 <0.30 <0.30   Lab Results  Component Value Date   WBC 4.9 06/19/2012   HGB 11.0* 06/19/2012   HCT 33.7* 06/19/2012   MCV 98.0 06/19/2012   PLT 184 06/19/2012    Lab 06/18/12 1447  NA 137  K 5.2*  CL 104  CO2 22  BUN 43*  CREATININE 1.10  CALCIUM 9.0  PROT 7.2  BILITOT 0.1*  ALKPHOS 97  ALT 10  AST 13  GLUCOSE 86   No components found with this basename: MAGNESIUM No components found with this basename: POCBNP:3  Basename 06/18/12 1447  TSH 2.040    Basename 06/18/12 1447  INR 1.07    Radiology/Studies 06/18/2012  *RADIOLOGY REPORT*  Clinical Data: Chest pain, hypertension, diabetes  CHEST - 2 VIEW  Comparison: January 23, 2011  Findings: The cardiac silhouette, mediastinum, pulmonary vasculature are within normal limits.  Both lungs are clear. There is no acute bony abnormality.  IMPRESSION: There is no evidence of acute cardiac or pulmonary process.  Original Report Authenticated By: Brandon Melnick, M.D.   12-lead ECG shows sinus rhythm with IVCD, QRS duration 128 ms Telemetry shows persistent sinus bradycardia with intermittent junctional bradycardia and pauses >4 seconds Treadmill nuclear stress test performed today - per report, patient walked for 6 minutes and her heart rate remained <80 bpm throughout the test; the test was stopped due to fatigue; perfusion images showed reversible perfusion defects suggesting inducible ischemia along the distal anterior wall and apex; focal hypokinesis and decreased contractility is most evident along the septal wall; ejection fraction of 49%, decreased from prior study  Assessment and Plan 1. Bradycardia with pauses - patient will likely need PPM; however, further definitive ischemic evaluation should be done prior to PPM implantation 2. DOE - ? symptomatic bradycardia; however, given her known CAD, this could also represent unstable angina, now with an abnormal stress test, would  consider definitive evaluation with cardiac catheterization to r/o CAD progression; Dr. Graciela Husbands has discussed with Dr. Katrinka Blazing  The patient was seen, examined and plan of care formulated with Dr. Sherryl Manges. Signed, Rick Duff, PA-C 06/19/2012, 4:09 PM  I am impressed with CP story associated with exercise intolerance and dyspnea. In the context of her Myoview, I think it may be reasonable to undertake catheterization prior to device implantation. It seems less likely to me that chronotropic incompetence is responsible for the chest pain, although it may well be responsible for dyspnea and exercise tolerance. I will defer this decision to Dr. Katrinka Blazing. Following that, though, her bradycardia is clearly an issue also chronotropic incompetence as demonstrated this morning as well as with significant pausing and bradycardia which could contribute to the symptoms as outlined above.  I discussed pacemaker implantation with the patient. And withher daughter Brion Aliment  [phone]  I e explained to the daughter that Dr. Katrinka Blazing would be making a decision regarding catheterization and possible intervention and that the decision regarding pacing would follow the  above and the timing would be probably in the next 48 hours as relates to the pacemaker.  I have written orders for the pacemaker she is continuing on heparin for right now. The daughter has asked that Dr. Katrinka Blazing call her if he has a chance to discuss the results of the Myoview scan which I outlined for her as showing blockage on the front wall of the heart but not likely contributing to her bradycardia. Having said this however, I am bothered by [employer] that is why should she develop chest pain and bradycardia the former related to coronary disease at the same time if the latter is not a consequence of the former. I don't have a good explanation for it. The lack of a myocardial infarction makes me think that the ischemia related to any artery should  be insufficient to cause protracted bradycardia. This makes me wonder as to whether the chest pain syndrome is in fact related to the bradycardia. An alternative approach would be to implant a pacemaker to use beta blockers as an antianginal and defer catheterization at this time. I have outlined this possibility with the daughter as well

## 2012-06-19 NOTE — Progress Notes (Signed)
Called at 0126 for patient with HR 30s-40s and frequent pauses. Based on documentation, patient has had low HR all day, but without the frequent pauses. Patient asymptomatic.  Advised bedside RN to place atropine at bedside and place pacer pads on patient. MD paged, did not want pads on patient due to underlying dementia. Advised bedside RN to call with further needs, will continue to monitor.

## 2012-06-19 NOTE — Progress Notes (Signed)
Pt is very unsteady walking to bed from XRAY.

## 2012-06-19 NOTE — Progress Notes (Signed)
ANTICOAGULATION CONSULT NOTE   Pharmacy Consult for Heparin Indication: chest pain/ACS  No Known Allergies  Patient Measurements: Height: 5\' 5"  (165.1 cm) Weight: 172 lb 6.4 oz (78.2 kg) IBW/kg (Calculated) : 57  Heparin Dosing Weight: 73.3 kg  Vital Signs: Temp: 97.3 F (36.3 C) (07/16 2020) Temp src: Oral (07/16 2020) BP: 152/64 mmHg (07/16 2020) Pulse Rate: 41  (07/16 2020)  Labs:  Basename 06/18/12 2320 06/18/12 2031 06/18/12 1448 06/18/12 1447 06/18/12 1118 06/18/12 1117  HGB -- -- -- -- -- 10.7*  HCT -- -- -- -- -- 32.4*  PLT -- -- -- -- -- 190  APTT -- -- -- 23* -- --  LABPROT -- -- -- 14.1 -- 13.5  INR -- -- -- 1.07 -- 1.01  HEPARINUNFRC 0.54 -- -- -- -- --  CREATININE -- -- -- 1.10 -- 1.12*  CKTOTAL -- 46 54 -- 57 --  CKMB -- 3.3 3.4 -- 3.4 --  TROPONINI -- <0.30 <0.30 -- <0.30 --    Estimated Creatinine Clearance: 41.5 ml/min (by C-G formula based on Cr of 1.1).  Assessment: 78 yo female with chest pain for Heparin  Goal of Therapy:  Heparin level 0.3-0.7 units/ml Monitor platelets by anticoagulation protocol: Yes   Plan:  Continue Heparin at current rate Follow-up am labs.  Geannie Risen, PharmD, BCPS 06/19/2012 12:32 AM

## 2012-06-19 NOTE — Progress Notes (Addendum)
At approximately 01:10, patient experienced three consecutive sinus arrests, the longest being 5.5 seconds. She began sustaining an irregular rhythm with rate from 29 - 40 beats per minute on telemetry. This appeared to be atrial fibrillation with slow ventricular response vs. junctional bradycardia with a bundle branch block, which have been hospital problems since admission. The patient was aroused.  She was not showing or describing symptoms of hypoperfusion. Vital signs were obtained, and were not significantly different from previous readings:  P: 39 R: 18 BP: 141/64 SPO2: 98% on room air  Dr. Charm Barges, on-call for Dr. Katrinka Blazing was consulted by telephone and ordered atropine 0.5mg  PRN at the bedside and suggested close monitoring with notification of any emergent needs. Rapid response RN was also made aware of the patient's current medical problems, and rounded on the patient to determine any additional needs. Patient continues to sustain asymptomatic bradycardia with rate from 29 - 40 beats per minute. No emergent interventions at this time, due to lack of symptoms. Continuing to closely monitor.  06:31 - Follow-up note: Obtained AM EKG which clearly shows sinus bradycardia with calculated rate of 34 beats per minute. Patient remains asymptomatic, however, BP is elevated at 182/62 by manual cuff. Other vital signs are normal for patient. Continuing to closely monitor.

## 2012-06-19 NOTE — Progress Notes (Signed)
ANTICOAGULATION CONSULT NOTE - Follow Up Consult  Pharmacy Consult for Heparin Indication: chest pain/ACS  No Known Allergies  Vital Signs: Temp: 97.1 F (36.2 C) (07/17 0534) Temp src: Oral (07/17 0534) BP: 182/62 mmHg (07/17 0534) Pulse Rate: 33  (07/17 0534)  Labs:  Alvira Philips 06/19/12 1610 06/19/12 0205 06/18/12 2320 06/18/12 2031 06/18/12 1448 06/18/12 1447 06/18/12 1117  HGB 11.0* -- -- -- -- -- 10.7*  HCT 33.7* -- -- -- -- -- 32.4*  PLT 184 -- -- -- -- -- 190  APTT -- -- -- -- -- 23* --  LABPROT -- -- -- -- -- 14.1 13.5  INR -- -- -- -- -- 1.07 1.01  HEPARINUNFRC 0.52 -- 0.54 -- -- -- --  CREATININE -- -- -- -- -- 1.10 1.12*  CKTOTAL -- 50 -- 46 54 -- --  CKMB -- 3.3 -- 3.3 3.4 -- --  TROPONINI -- <0.30 -- <0.30 <0.30 -- --    Estimated Creatinine Clearance: 41.5 ml/min (by C-G formula based on Cr of 1.1).  Medications:  Heparin @ 900 units/hr  Assessment: 83yof continues on heparin while ACS being ruled out. Heparin level is therapeutic. Cardiac enzymes negative. Plan for stress test today and cath only if large area of ischemia identified. No bleeding noted. CBC low but stable.  Goal of Therapy:  Heparin level 0.3-0.7 units/ml Monitor platelets by anticoagulation protocol: Yes   Plan:  1) Continue heparin at 900 units/hr 2) Follow up after stress test 3) Follow up heparin level, CBC in AM if to continue  Fredrik Rigger 06/19/2012,8:19 AM

## 2012-06-19 NOTE — Care Management Note (Signed)
    Page 1 of 1   06/20/2012     10:37:51 AM   CARE MANAGEMENT NOTE 06/20/2012  Patient:  [name] [name]   Account Number:  [id number]  Date Initiated:  06/19/2012  Documentation initiated by:  SIMMONS,CRYSTAL  Subjective/Objective Assessment:   ADMITTED WITH CHEST PAIN; LIVES AT [facility]; HAS RW; USES [facility] [address] FOR MEDS.     Action/Plan:   DISCHARGE PLANNING DISCUSSED AT BEDSIDE.   Anticipated DC Date:  06/20/2012   Anticipated DC Plan:  ASSISTED LIVING / REST HOME  In-house referral  Clinical Social Worker      DC Planning Services  CM consult      Choice offered to / List presented to:             Status of service:  In process, will continue to follow Medicare Important Message given?   (If response is "NO", the following Medicare IM given date fields will be blank) Date Medicare IM given:   Date Additional Medicare IM given:    Discharge Disposition:    Per UR Regulation:  Reviewed for med. necessity/level of care/duration of stay  If discussed at Long Length of Stay Meetings, dates discussed:    Comments:  06-20-12 10:30am Avie Arenas, RNBSN [phone] Plan for pacemaker today.  Lived independently with no assistance or assitive aides.  At this time plan to return to this level.  Will continue to follow, no further needs at this time.  06/19/12  1156  CRYSTAL SIMMONS RN, BSN [phone] PLAN TO D/C BACK TO ALF.

## 2012-06-19 NOTE — Progress Notes (Signed)
Inpatient Diabetes Program Recommendations  AACE/ADA: New Consensus Statement on Inpatient Glycemic Control  Target Ranges:  Prepandial:   less than 140 mg/dL      Peak postprandial:   less than 180 mg/dL (1-2 hours)      Critically ill patients:  140 - 180 mg/dL  Pager:  [phone] Hours:  8 am-10pm   Reason for Visit: Hypoglycemia:  62 mg/dL  Inpatient Diabetes Program Recommendations Correction (SSI): Check CBGS while on oral agents Oral Agents: Consider holding Glyburide while inpatient  Alfredia Client PhD, RN Diabetes Coordinator  Office:  [phone] Team Pager:  [phone]

## 2012-06-19 NOTE — Progress Notes (Signed)
Patient Name: [name] [name] Date of Encounter: 06/19/2012    SUBJECTIVE: Several week history of fatigue, dyspnea and chest pain on exertion. Since admission, extreme bradycardia noted.   TELEMETRY:  Junctional rhythm and severe sinus brady without symptoms. No HR > 60 Filed Vitals:   06/18/12 1435 06/18/12 2020 06/19/12 0110 06/19/12 0534  BP: 162/75 152/64 141/64 182/62  Pulse:  41 39 33  Temp: 97.8 F (36.6 C) 97.3 F (36.3 C)  97.1 F (36.2 C)  TempSrc:  Oral  Oral  Resp: 18 18 18 18   Height:      Weight:      SpO2: 98% 96% 98% 98%    Intake/Output Summary (Last 24 hours) at 06/19/12 0907 Last data filed at 06/19/12 0730  Gross per 24 hour  Intake      0 ml  Output    350 ml  Net   -350 ml    LABS: Basic Metabolic Panel:  Basename 06/18/12 1447 06/18/12 1117  NA 137 137  K 5.2* 4.9  CL 104 104  CO2 22 20  GLUCOSE 86 164*  BUN 43* 43*  CREATININE 1.10 1.12*  CALCIUM 9.0 9.0  MG -- --  PHOS -- --   CBC:  Basename 06/19/12 0635 06/18/12 1117  WBC 4.9 5.8  NEUTROABS -- 3.6  HGB 11.0* 10.7*  HCT 33.7* 32.4*  MCV 98.0 96.4  PLT 184 190   Cardiac Enzymes:  Basename 06/19/12 0205 06/18/12 2031 06/18/12 1448  CKTOTAL 50 46 54  CKMB 3.3 3.3 3.4  CKMBINDEX -- -- --  TROPONINI <0.30 <0.30 <0.30   BNP    Component Value Date/Time   PROBNP 176.0* 02/10/2011 0151   Radiology/Studies:  No chf or CE  Physical Exam: Blood pressure 182/62, pulse 33, temperature 97.1 F (36.2 C), temperature source Oral, resp. rate 18, height 5\' 5"  (1.651 m), weight 78.2 kg (172 lb 6.4 oz), SpO2 98.00%. Weight change:    Bradycardia and 1/6 systolic murmur.  Difficulty with coherent conversation.  ASSESSMENT:  1. Severe bradycardia due to sinus node dysfunction  2. Chest pain consistent with angina, r/o CAD vs demand related.  3. Coronary artery disease  4. Cerebrovasular disease   Plan:  1. Needs pacer  2. I will probably have to cath to exclude progression of  CAD 3. ETT with nuclear scintigraphy with be attempted this AM. Not a candidate for Lexiscan.  Selinda Eon 06/19/2012, 9:07 AM

## 2012-06-19 NOTE — Progress Notes (Signed)
Pt states 'I do not want this test". Pt states, "but I will do it if it will help me." HR dropping to 36-38 at times, then goes back to 58. Rhythm shows junctional at times. Pt says she wants to do it if her doctor wants it.

## 2012-06-20 ENCOUNTER — Encounter (HOSPITAL_COMMUNITY)
Admission: EM | Disposition: A | Discharge status: Home / Self Care | Payer: Self-pay | Source: Home / Self Care | Attending: Interventional Cardiology

## 2012-06-20 DIAGNOSIS — I498 Other specified cardiac arrhythmias: Secondary | ICD-10-CM

## 2012-06-20 HISTORY — PX: PERMANENT PACEMAKER INSERTION: SHX5480

## 2012-06-20 HISTORY — PX: INSERT / REPLACE / REMOVE PACEMAKER: SUR710

## 2012-06-20 LAB — CBC
Hemoglobin: 11.5 g/dL — ABNORMAL LOW (ref 12.0–15.0)
Platelets: 177 10*3/uL (ref 150–400)
RBC: 3.57 MIL/uL — ABNORMAL LOW (ref 3.87–5.11)
WBC: 4.6 10*3/uL (ref 4.0–10.5)

## 2012-06-20 LAB — HEPARIN LEVEL (UNFRACTIONATED): Heparin Unfractionated: 0.58 IU/mL (ref 0.30–0.70)

## 2012-06-20 SURGERY — PERMANENT PACEMAKER INSERTION
Anesthesia: LOCAL

## 2012-06-20 SURGERY — LEFT HEART CATHETERIZATION WITH CORONARY ANGIOGRAM
Anesthesia: LOCAL

## 2012-06-20 MED ORDER — CEFAZOLIN SODIUM-DEXTROSE 2-3 GM-% IV SOLR
2.0000 g | INTRAVENOUS | Status: DC
  Filled 2012-06-20: qty 50

## 2012-06-20 MED ORDER — ACETAMINOPHEN 325 MG PO TABS: 325.0000 mg | ORAL_TABLET | ORAL | Status: DC | PRN

## 2012-06-20 MED ORDER — GENTAMICIN SULFATE 40 MG/ML IJ SOLN
80.0000 mg | INTRAMUSCULAR | Status: DC
  Filled 2012-06-20: qty 2

## 2012-06-20 MED ORDER — MIDAZOLAM HCL 2 MG/2ML IJ SOLN
INTRAMUSCULAR | Status: AC
  Filled 2012-06-20: qty 2

## 2012-06-20 MED ORDER — CEFAZOLIN SODIUM-DEXTROSE 2-3 GM-% IV SOLR
2.0000 g | Freq: Four times a day (QID) | INTRAVENOUS | Status: AC
  Administered 2012-06-20 – 2012-06-21 (×3): 2 g via INTRAVENOUS
  Filled 2012-06-20 (×3): qty 50

## 2012-06-20 MED ORDER — HEPARIN (PORCINE) IN NACL 2-0.9 UNIT/ML-% IJ SOLN
INTRAMUSCULAR | Status: AC
  Filled 2012-06-20: qty 1000

## 2012-06-20 MED ORDER — LIDOCAINE HCL (PF) 1 % IJ SOLN
INTRAMUSCULAR | Status: AC
  Filled 2012-06-20: qty 30

## 2012-06-20 MED ORDER — ONDANSETRON HCL 4 MG/2ML IJ SOLN: 4.0000 mg | Freq: Four times a day (QID) | INTRAMUSCULAR | Status: DC | PRN

## 2012-06-20 MED ORDER — LIDOCAINE HCL (PF) 1 % IJ SOLN
INTRAMUSCULAR | Status: AC
  Filled 2012-06-20: qty 60

## 2012-06-20 MED ORDER — FENTANYL CITRATE 0.05 MG/ML IJ SOLN
INTRAMUSCULAR | Status: AC
  Filled 2012-06-20: qty 2

## 2012-06-20 NOTE — Progress Notes (Signed)
Patient Name: [name] [name] Date of Encounter: 06/20/2012    SUBJECTIVE: Not complaining of chest pain. Did not have chest pain during treadmill test yesterday. The nuclear test it is mildly abnormal with distal anterior wall and apical ischemia. LV function is low normal without regional wall motion abnormality.  I spent with the patient's daughter and the patient. Her cognitive impairment, mostly around recent memory, has been present since she had CVAs many years ago. There is been no recent change.  This morning the patient states that she has not been troubled by chest pain. Her main complaint is exertional fatigue and dyspnea over the past 7-10 days.  TELEMETRY:  Severe bradycardia with heart rates as low as 28 and pauses up to 6 seconds. Filed Vitals:   06/19/12 1429 06/19/12 1436 06/19/12 1932 06/20/12 0431  BP: 115/69 185/54 122/52 164/66  Pulse: 71 42 38 55  Temp:   97.9 F (36.6 C) 98 F (36.7 C)  TempSrc:   Oral Oral  Resp:   18 18  Height:      Weight:      SpO2:   99% 97%    Intake/Output Summary (Last 24 hours) at 06/20/12 0945 Last data filed at 06/19/12 1800  Gross per 24 hour  Intake    480 ml  Output    350 ml  Net    130 ml    LABS: Basic Metabolic Panel:  Basename 06/18/12 1447 06/18/12 1117  NA 137 137  K 5.2* 4.9  CL 104 104  CO2 22 20  GLUCOSE 86 164*  BUN 43* 43*  CREATININE 1.10 1.12*  CALCIUM 9.0 9.0  MG -- --  PHOS -- --   CBC:  Basename 06/20/12 0655 06/19/12 0635 06/18/12 1117  WBC 4.6 4.9 --  NEUTROABS -- -- 3.6  HGB 11.5* 11.0* --  HCT 35.0* 33.7* --  MCV 98.0 98.0 --  PLT 177 184 --   Cardiac Enzymes:  Basename 06/19/12 0205 06/18/12 2031 06/18/12 1448  CKTOTAL 50 46 54  CKMB 3.3 3.3 3.4  CKMBINDEX -- -- --  TROPONINI <0.30 <0.30 <0.30   Hemoglobin A1C:  Basename 06/18/12 1447  HGBA1C 6.3*    Radiology/Studies:  RADIOLOGY REPORT*  Clinical Data: Bradycardia  MYOCARDIAL IMAGING WITH SPECT (REST AND EXERCISE)    GATED LEFT VENTRICULAR WALL MOTION STUDY  LEFT VENTRICULAR EJECTION FRACTION  Technique: Standard myocardial SPECT imaging was performed after  resting intravenous injection of 10 mCi Tc-61m tetrofosmin.  Subsequently, exercise tolerance test was performed by the patient  under the supervision of the Cardiology staff. At peak-stress, 10  mCi Tc-62m tetrofosminwas injected intravenously and standard  myocardial SPECT imaging was performed. Quantitative gated imaging  was also performed to evaluate left ventricular wall motion, and  estimate left ventricular ejection fraction.  Comparison: Two-view chest x-ray 09/18/2012. Report of nuclear  medicine myocardial perfusion examination and 06/29/2003.  Findings: The exercise stress test was nondiagnostic as the patient  was unable to a levator heart rate of 80 beats per minute. This  will be reported separately. She walk for 6 minutes on a manual  protocol. The test was stopped due to fatigue.  A reversible defect is present at the apex following exercise. No  fixed defects are evident.  Contractility is abnormal with incomplete thickening. There is  hypokinesis along the septal wall.  The estimated diastolic function is 77. The estimated systolic  function is 40 with an estimated ejection fraction of 49%.  IMPRESSION:  1. Reversible perfusion defects suggesting inducible ischemia  along the distal anterior wall and apex.  2. Focal hypokinesis and decreased contractility is most evident  along the septal wall.  3. Ejection fraction of 49%, decreased from prior study.  Original Report Authenticated By: Jamesetta Orleans. MATTERN, M.D.   Physical Exam: Blood pressure 164/66, pulse 55, temperature 98 F (36.7 C), temperature source Oral, resp. rate 18, height 5\' 5"  (1.651 m), weight 78.2 kg (172 lb 6.4 oz), SpO2 97.00%. Weight change:    Laying flat in bed. No dyspnea.  Neck veins are flat with patient lying at 30.  Lungs are clear to  auscultation and percussion.  1/6 systolic murmur. No diastolic murmur.  There is no peripheral edema.  ASSESSMENT:  1. Sinus node dysfunction with chronotropic incompetence documented by stress test yesterday. This was the reason for admission to the hospital and the most apparent cause of her recent onset of exertional dyspnea and fatigue.  2. Progression of coronary artery disease to the point we now have a positive nuclear study for  distal anterior wall ischemia (relatively small) without high-risk features such as ischemic dilatation, multizone perfusion abnormality, or angina during stress. When compare to 11/2008 study the region of ischemia is unchanged. The LVEF of 49% is stable when compared to echo from 2011 when was 45-50%.  3. Dementia with short term memory problems since stroke  4.Diabetes mellitus  5. Remote CVA with persistent gait abnormality  6. Dementia   Plan:  1. Remain NPO for PPM later today.  2. Coronary disease is stable and I plan to continue medical therapy  3. Spoke to the daughter and explained the approach  Signed, Lesleigh Noe 06/20/2012, 9:45 AM

## 2012-06-20 NOTE — Progress Notes (Signed)
UR Completed.  Vangie Bicker [phone] 06/20/2012

## 2012-06-20 NOTE — Op Note (Signed)
DDD PPM insertion via the left subclavian vein. U#[id number].

## 2012-06-20 NOTE — Progress Notes (Signed)
Patient ID: [name] [name], female   DOB: 1928/05/28, 78 y.o.   MRN: [id number] Subjective:  No chest pain. Reviewed the findings of the nuclear stress test with Dr. Katrinka Blazing as well as clinical decision making.   Objective:  Vital Signs in the last 24 hours: Temp:  [97.9 F (36.6 C)-98 F (36.7 C)] 98 F (36.7 C) (07/18 0431) Pulse Rate:  [38-73] 55  (07/18 0431) Resp:  [18] 18  (07/18 0431) BP: (115-185)/(47-72) 164/66 mmHg (07/18 0431) SpO2:  [97 %-99 %] 97 % (07/18 0431)  Intake/Output from previous day: 07/17 0701 - 07/18 0700 In: 480 [P.O.:480] Out: 700 [Urine:700] Intake/Output from this shift:    Physical Exam: elderly appearing NAD HEENT: Unremarkable Neck:  No JVD, no thyromegally Lungs:  Clear with no wheezes, rales, or rhonchi HEART:  Regular rate rhythm, no murmurs, no rubs, no clicks Abd:  Flat, positive bowel sounds, no organomegally, no rebound, no guarding Ext:  2 plus pulses, no edema, no cyanosis, no clubbing Skin:  No rashes no nodules Neuro:  CN II through XII intact, motor grossly intact  Lab Results:  Basename 06/20/12 0655 06/19/12 0635  WBC 4.6 4.9  HGB 11.5* 11.0*  PLT 177 184    Basename 06/18/12 1447 06/18/12 1117  NA 137 137  K 5.2* 4.9  CL 104 104  CO2 22 20  GLUCOSE 86 164*  BUN 43* 43*  CREATININE 1.10 1.12*    Basename 06/19/12 0205 06/18/12 2031  TROPONINI <0.30 <0.30   Hepatic Function Panel  Basename 06/18/12 1447  PROT 7.2  ALBUMIN 3.5  AST 13  ALT 10  ALKPHOS 97  BILITOT 0.1*  BILIDIR --  IBILI --   No results found for this basename: CHOL in the last 72 hours No results found for this basename: PROTIME in the last 72 hours  Imaging: Dg Chest 2 View  06/18/2012  *RADIOLOGY REPORT*  Clinical Data: Chest pain, hypertension, diabetes  CHEST - 2 VIEW  Comparison: January 23, 2011  Findings: The cardiac silhouette, mediastinum, pulmonary vasculature are within normal limits.  Both lungs are clear. There is no acute bony  abnormality.  IMPRESSION: There is no evidence of acute cardiac or pulmonary process.  Original Report Authenticated By: Brandon Melnick, M.D.   Nm Myocar Multi W/spect W/wall Motion / Ef  06/19/2012  *RADIOLOGY REPORT*  Clinical Data:  Bradycardia  MYOCARDIAL IMAGING WITH SPECT (REST AND EXERCISE) GATED LEFT VENTRICULAR WALL MOTION STUDY LEFT VENTRICULAR EJECTION FRACTION  Technique:  Standard myocardial SPECT imaging was performed after resting intravenous injection of  10 mCi Tc-40m tetrofosmin. Subsequently, exercise tolerance test was performed by the patient under the supervision of the Cardiology staff.  At peak-stress, 10 mCi Tc-44m tetrofosminwas injected intravenously and standard myocardial SPECT imaging was performed.  Quantitative gated imaging was also performed to evaluate left ventricular wall motion, and estimate left ventricular ejection fraction.  Comparison:  Two-view chest x-ray 09/18/2012.  Report of nuclear medicine myocardial perfusion examination and 06/29/2003.  Findings: The exercise stress test was nondiagnostic as the patient was unable to a levator heart rate of 80 beats per minute.  This will be reported separately.  She walk for 6 minutes on a manual protocol.  The test was stopped due to fatigue.  A reversible defect is present at the apex following exercise.  No fixed defects are evident.  Contractility is abnormal with incomplete thickening.  There is hypokinesis along the septal wall.  The estimated diastolic function is 77.  The estimated systolic function is 40 with an estimated ejection fraction of 49%.  IMPRESSION:  1.  Reversible perfusion defects suggesting inducible ischemia along the distal anterior wall and apex. 2.  Focal hypokinesis and decreased contractility is most evident along the septal wall. 3.  Ejection fraction of 49%, decreased from prior study.  Original Report Authenticated By: Jamesetta Orleans. MATTERN, M.D.    Cardiac Studies: Tele - NSR with  LBBB Assessment/Plan:  1.symptomatic brady 2. LBBB 3. Abnormal stress test Rec: discussed with Dr. Katrinka Blazing. Her stress test is low risk. Her chest pain story is variable. She is currently denying. She is not a surgical candidate. Consensus is that avoiding cath, placing pacemaker and uptitration of medical therapy a reasonable alternative to catheterization. Will try to implant later today or tomorrow. Lewayne Bunting, M.D.  LOS: 2 days    Lewayne Bunting 06/20/2012, 8:49 AM

## 2012-06-20 NOTE — Progress Notes (Signed)
ANTICOAGULATION CONSULT NOTE - Follow Up Consult  Pharmacy Consult for Heparin Indication: chest pain/ACS  No Known Allergies  Vital Signs: Temp: 98 F (36.7 C) (07/18 0431) Temp src: Oral (07/18 0431) BP: 164/66 mmHg (07/18 0431) Pulse Rate: 55  (07/18 0431)  Labs:  Alvira Philips 06/20/12 0454 06/19/12 0981 06/19/12 0205 06/18/12 2320 06/18/12 2031 06/18/12 1448 06/18/12 1447 06/18/12 1117  HGB 11.5* 11.0* -- -- -- -- -- --  HCT 35.0* 33.7* -- -- -- -- -- 32.4*  PLT 177 184 -- -- -- -- -- 190  APTT -- -- -- -- -- -- 23* --  LABPROT -- -- -- -- -- -- 14.1 13.5  INR -- -- -- -- -- -- 1.07 1.01  HEPARINUNFRC 0.58 0.52 -- 0.54 -- -- -- --  CREATININE -- -- -- -- -- -- 1.10 1.12*  CKTOTAL -- -- 50 -- 46 54 -- --  CKMB -- -- 3.3 -- 3.3 3.4 -- --  TROPONINI -- -- <0.30 -- <0.30 <0.30 -- --    Estimated Creatinine Clearance: 41.5 ml/min (by C-G formula based on Cr of 1.1).   Medications:  Heparin @ 900 units/hr  Assessment: 83yof continues on heparin with a therapeutic heparin level. No bleeding noted. CBC low but stable.  Deciding on PPM versus cath.  Goal of Therapy:  Heparin level 0.3-0.7 units/ml Monitor platelets by anticoagulation protocol: Yes   Plan:  1) Continue heparin at 900 units/hr 2) Follow up cardiac plan 3) Follow up heparin level and CBC in AM if to continue  Fredrik Rigger 06/20/2012,8:45 AM

## 2012-06-21 ENCOUNTER — Encounter: Payer: Self-pay | Admitting: *Deleted

## 2012-06-21 ENCOUNTER — Encounter (HOSPITAL_COMMUNITY)
Admission: EM | Disposition: A | Discharge status: Home / Self Care | Payer: Self-pay | Source: Home / Self Care | Attending: Interventional Cardiology

## 2012-06-21 ENCOUNTER — Inpatient Hospital Stay (HOSPITAL_COMMUNITY): Payer: Medicare Other

## 2012-06-21 DIAGNOSIS — I447 Left bundle-branch block, unspecified: Secondary | ICD-10-CM | POA: Insufficient documentation

## 2012-06-21 DIAGNOSIS — I2510 Atherosclerotic heart disease of native coronary artery without angina pectoris: Secondary | ICD-10-CM | POA: Diagnosis present

## 2012-06-21 DIAGNOSIS — K219 Gastro-esophageal reflux disease without esophagitis: Secondary | ICD-10-CM | POA: Insufficient documentation

## 2012-06-21 DIAGNOSIS — I639 Cerebral infarction, unspecified: Secondary | ICD-10-CM | POA: Diagnosis not present

## 2012-06-21 DIAGNOSIS — I495 Sick sinus syndrome: Secondary | ICD-10-CM | POA: Diagnosis present

## 2012-06-21 DIAGNOSIS — I10 Essential (primary) hypertension: Secondary | ICD-10-CM | POA: Insufficient documentation

## 2012-06-21 DIAGNOSIS — Z950 Presence of cardiac pacemaker: Secondary | ICD-10-CM | POA: Insufficient documentation

## 2012-06-21 DIAGNOSIS — G40909 Epilepsy, unspecified, not intractable, without status epilepticus: Secondary | ICD-10-CM | POA: Insufficient documentation

## 2012-06-21 DIAGNOSIS — I209 Angina pectoris, unspecified: Secondary | ICD-10-CM | POA: Diagnosis present

## 2012-06-21 DIAGNOSIS — E119 Type 2 diabetes mellitus without complications: Secondary | ICD-10-CM | POA: Insufficient documentation

## 2012-06-21 SURGERY — PERMANENT PACEMAKER INSERTION
Anesthesia: Moderate Sedation | Laterality: Bilateral

## 2012-06-21 MED ORDER — METOPROLOL SUCCINATE ER 25 MG PO TB24
25.0000 mg | ORAL_TABLET | Freq: Once | ORAL | Status: AC
  Administered 2012-06-21: 25 mg via ORAL
  Filled 2012-06-21 (×2): qty 1

## 2012-06-21 MED ORDER — METOPROLOL SUCCINATE ER 25 MG PO TB24: 25.0000 mg | ORAL_TABLET | Freq: Every day | ORAL | Status: DC

## 2012-06-21 NOTE — Progress Notes (Signed)
Patient Name: [name] [name]      SUBJECTIVE: without complaint  No sob or chest pain  Past Medical History  Diagnosis Date  . Diabetes mellitus   . Hypertension   . Acid reflux   . Seizures   . Coronary artery disease 2003    60-75%D2, 50% D1, 25% left circ, 20% LAD, normal LVF  . Dyslipidemia   . Stroke   . LBBB (left bundle branch block)     PHYSICAL EXAM Filed Vitals:   06/20/12 0431 06/20/12 1216 06/20/12 2039 06/21/12 0522  BP: 164/66  166/79 174/70  Pulse: 55 67 61 64  Temp: 98 F (36.7 C)  98.4 F (36.9 C) 97.4 F (36.3 C)  TempSrc: Oral  Oral Oral  Resp: 18  18 18   Height:      Weight:      SpO2: 97%  96%     General appearance: alert, cooperative and no distress Lungs: clear to auscultation bilaterally Heart: regular rate and rhythm, S1, S2 normal, no murmur, click, rub or gallop Extremities: extremities normal, atraumatic, no cyanosis or edema Skin: Skin color, texture, turgor normal. No rashes or lesions Neurologic: Grossly normal Device pocket well healed; without hematoma or erythema   TELEMETRY: Reviewed telemetry pt in atrial pacing:    Intake/Output Summary (Last 24 hours) at 06/21/12 0801 Last data filed at 06/21/12 0252  Gross per 24 hour  Intake    340 ml  Output    450 ml  Net   -110 ml    LABS: Basic Metabolic Panel:  Lab 06/18/12 1610 06/18/12 1117  NA 137 137  K 5.2* 4.9  CL 104 104  CO2 22 20  GLUCOSE 86 164*  BUN 43* 43*  CREATININE 1.10 1.12*  CALCIUM 9.0 9.0  MG -- --  PHOS -- --   Cardiac Enzymes:  Basename 06/19/12 0205 06/18/12 2031 06/18/12 1448  CKTOTAL 50 46 54  CKMB 3.3 3.3 3.4  CKMBINDEX -- -- --  TROPONINI <0.30 <0.30 <0.30   CBC:  Lab 06/20/12 0655 06/19/12 0635 06/18/12 1117  WBC 4.6 4.9 5.8  NEUTROABS -- -- 3.6  HGB 11.5* 11.0* 10.7*  HCT 35.0* 33.7* 32.4*  MCV 98.0 98.0 96.4  PLT 177 184 190   PROTIME:  Basename 06/18/12 1447 06/18/12 1117  LABPROT 14.1 13.5  INR 1.07 1.01   Liver  Function Tests:  Basename 06/18/12 1447 06/18/12 1117  AST 13 14  ALT 10 11  ALKPHOS 97 101  BILITOT 0.1* 0.1*  PROT 7.2 7.5  ALBUMIN 3.5 3.6   No results found for this basename: LIPASE:2,AMYLASE:2 in the last 72 hours BNP: No components found with this basename: POCBNP:3 D-Dimer: No results found for this basename: DDIMER:2 in the last 72 hours Hemoglobin A1C:  Basename 06/18/12 1447  HGBA1C 6.3*   Fasting Lipid Panel: No results found for this basename: CHOL,HDL,LDLCALC,TRIG,CHOLHDL,LDLDIRECT in the last 72 hours Thyroid Function Tests:  Basename 06/18/12 1447  TSH 2.040  T4TOTAL --  T3FREE --  THYROIDAB --   Anemia Panel: No results found for this basename: VITAMINB12,FOLATE,FERRITIN,TIBC,IRON,RETICCTPCT in the last 72 hours   Device Interrogation:device function normal   ASSESSMENT AND PLAN:  Patient Active Hospital Problem List: Sinus node dysfunction (06/21/2012)   Angina pectoris (06/21/2012)  Pt s/p pacer for sinus node dysfunction  Also w  Cp consistent with angina Ok to discharge, but would consider the addition of beta blocker and will defer to Dr Katrinka Blazing  Wound check per Dr  smith unless we hear differently--we are glad to do whatever      Signed, Sherryl Manges MD  06/21/2012

## 2012-06-21 NOTE — Plan of Care (Signed)
Problem: Phase II Progression Outcomes Goal: Pacer site without bleeding/hematoma Outcome: Not Met (add Reason) Small hematoma present stain-marked

## 2012-06-21 NOTE — Progress Notes (Signed)
Clinical Social Work Department BRIEF PSYCHOSOCIAL ASSESSMENT 06/21/2012  Patient:  [name] [name],[name] [name]     Account Number:  [id number]     Admit date:  06/18/2012  Clinical Social Worker:  Conley Simmonds  Date/Time:  06/21/2012 04:30 PM  Referred by:  Care Management  Date Referred:  06/21/2012 Referred for  Other - See comment   Other Referral:   From Facility   Interview type:  Patient Other interview type:    PSYCHOSOCIAL DATA Living Status:  FACILITY Admitted from facility:  [facility] Level of care:  Assisted Living Primary support name:   Primary support relationship to patient:   Degree of support available:    CURRENT CONCERNS Current Concerns  Post-Acute Placement   Other Concerns:    SOCIAL WORK ASSESSMENT / PLAN CSW met with pt and left message for dtr regarding return to Facility-  Pt is a resident of [facility] and plan is to return-  CSW initiating FL2 with signature page and will arrange d/c planning back to ALF-   Assessment/plan status:  Psychosocial Support/Ongoing Assessment of Needs Other assessment/ plan:   Information/referral to community resources:   None at this time    PATIENT'S/FAMILY'S RESPONSE TO PLAN OF CARE: Pt anxious to return to [facility] and they will be able to provide transportation at d/c if all paperwork completed-  CSW will complete FL2 for signature and facilitate d/c-  Jodean Lima, [phone]

## 2012-06-21 NOTE — Discharge Summary (Signed)
Patient ID: [name] [name] MRN: [id number] DOB/AGE: [id number] 76 y.o.  Admit date: 06/18/2012 Discharge date: 06/21/2012  Primary Discharge Diagnosis: Sinus node dysfunction treated with DDD pacer Secondary Discharge Diagnosis: CAD with angina  CVA, remote  Hypertension   Patient Active Problem List  Diagnosis  . Sinus node dysfunction  . Angina pectoris  . Pacemaker-Medtronic  . Coronary artery disease  . Diabetes mellitus  . Acid reflux disease  . Hypertension  . Left bundle branch block  . CVA (cerebral vascular accident)  . Seizure disorder   Significant Diagnostic Studies: Nuclear perfusion study  Consults:  EP, Dr. Beverly Sessions Course: The patient was admitted with exertional fatigue and dyspnea. There was also a vague history of chest pain that was difficult to verify. Telemetry and ETT demonstrated Sinus Node Dysfunction. EP felt that pacemaker therapy was appropriate. DDD pacer was placed by Dr. Ladona Ridgel on 06/20/12.  Stress nuclear on 06/19/12 showed apical ischemia similar to 2009 pattern. Beta blocker therapy was added for CAD management.   Discharge Exam: Blood pressure 174/70, pulse 64, temperature 97.4 F (36.3 C), temperature source Oral, resp. rate 18, height 5\' 5"  (1.651 m), weight 78.2 kg (172 lb 6.4 oz), SpO2 96.00%.   Pacer site is stable Labs:   Lab Results  Component Value Date   WBC 4.6 06/20/2012   HGB 11.5* 06/20/2012   HCT 35.0* 06/20/2012   MCV 98.0 06/20/2012   PLT 177 06/20/2012    Lab 06/18/12 1447  NA 137  K 5.2*  CL 104  CO2 22  BUN 43*  CREATININE 1.10  CALCIUM 9.0  PROT 7.2  BILITOT 0.1*  ALKPHOS 97  ALT 10  AST 13  GLUCOSE 86   Lab Results  Component Value Date   CKTOTAL 50 06/19/2012   CKMB 3.3 06/19/2012   TROPONINI <0.30 06/19/2012    Lab Results  Component Value Date   CHOL  Value: 163        ATP III CLASSIFICATION:  <200     mg/dL   Desirable  409-811  mg/dL   Borderline High  >=914    mg/dL   High         06/11/2955   CHOL  Value: 136        ATP III CLASSIFICATION:  <200     mg/dL   Desirable  213-086  mg/dL   Borderline High  >=578    mg/dL   High        03/09/9628   Lab Results  Component Value Date   HDL 61 02/10/2011   HDL 47 07/10/2010   Lab Results  Component Value Date   LDLCALC  Value: 86        Total Cholesterol/HDL:CHD Risk Coronary Heart Disease Risk Table                     Men   Women  1/2 Average Risk   3.4   3.3  Average Risk       5.0   4.4  2 X Average Risk   9.6   7.1  3 X Average Risk  23.4   11.0        Use the calculated Patient Ratio above and the CHD Risk Table to determine the patient's CHD Risk.        ATP III CLASSIFICATION (LDL):  <100     mg/dL   Optimal  528-413  mg/dL   Near or  Above                    Optimal  130-159  mg/dL   Borderline  782-956  mg/dL   High  >213     mg/dL   Very High 0/07/6577   LDLCALC  Value: 68        Total Cholesterol/HDL:CHD Risk Coronary Heart Disease Risk Table                     Men   Women  1/2 Average Risk   3.4   3.3  Average Risk       5.0   4.4  2 X Average Risk   9.6   7.1  3 X Average Risk  23.4   11.0        Use the calculated Patient Ratio above and the CHD Risk Table to determine the patient's CHD Risk.        ATP III CLASSIFICATION (LDL):  <100     mg/dL   Optimal  469-629  mg/dL   Near or Above                    Optimal  130-159  mg/dL   Borderline  528-413  mg/dL   High  >244     mg/dL   Very High 0/12/270   Lab Results  Component Value Date   TRIG 79 02/10/2011   TRIG 103 07/10/2010   Lab Results  Component Value Date   CHOLHDL 2.7 02/10/2011   CHOLHDL 2.9 07/10/2010   No results found for this basename: LDLDIRECT      Radiology: no pneumothorax EKG:Av sequential pacing  FOLLOW UP PLANS AND APPOINTMENTS  Medication List  As of 06/21/2012  1:50 PM   TAKE these medications         ALPRAZolam 0.25 MG tablet   Commonly known as: XANAX   Take 0.125 mg by mouth every 8 (eight) hours as needed. For anxiety      aspirin 325 MG  tablet   Take 325 mg by mouth daily.      carbamazepine 200 MG tablet   Commonly known as: TEGRETOL   Take 400 mg by mouth 2 (two) times daily.      cholecalciferol 1000 UNITS tablet   Commonly known as: VITAMIN D   Take 1,000 Units by mouth 2 (two) times daily.      clopidogrel 75 MG tablet   Commonly known as: PLAVIX   Take 75 mg by mouth daily.      glyBURIDE 2.5 MG tablet   Commonly known as: DIABETA   Take 2.5 mg by mouth daily with breakfast.      isosorbide mononitrate 30 MG 24 hr tablet   Commonly known as: IMDUR   Take 60 mg by mouth daily.      LASTACAFT 0.25 % Soln   Generic drug: Alcaftadine   Apply 1 drop to eye daily.      metoprolol succinate 25 MG 24 hr tablet   Commonly known as: TOPROL-XL   Take 1 tablet (25 mg total) by mouth daily.      mometasone 0.1 % ointment   Commonly known as: ELOCON   Apply 1 application topically daily. To eyelids for 1 week courses when inflammation occurs      nitroGLYCERIN 0.4 MG SL tablet   Commonly known as: NITROSTAT   Place 0.4 mg under the tongue every 5 (five)  minutes as needed. For chest pain      omeprazole 20 MG capsule   Commonly known as: PRILOSEC   Take 20 mg by mouth daily.      simvastatin 20 MG tablet   Commonly known as: ZOCOR   Take 20 mg by mouth at bedtime.      telmisartan 80 MG tablet   Commonly known as: MICARDIS   Take 80 mg by mouth daily.           Follow-up Information    Follow up with Lesleigh Noe, MD on 07/01/2012. (Pacer  f/u  at 10:50A will be seen by CFF)    Contact information:   [address] [place] [address] [place] [address] [phone]          BRING ALL MEDICATIONS WITH YOU TO FOLLOW UP APPOINTMENTS  Time spent with patient to include physician time: 20 min Signed: Lesleigh Noe 06/21/2012, 1:50 PM

## 2012-06-21 NOTE — Progress Notes (Signed)
Clinical Social Work-CSW facilitated pt d/c back to ALF with fl2 completed and signed/d/c summary/AVS and facility providing transportation- No further needs at this time- Jodean Lima, [phone]

## 2012-06-21 NOTE — Op Note (Signed)
[name], [name] NO.:  [id number]  MEDICAL RECORD NO.:  [id number]  LOCATION:  2002                         FACILITY:  [facility]  PHYSICIAN:  Doylene Canning. Ladona Ridgel, MD    DATE OF BIRTH:  05-03-1928  DATE OF PROCEDURE:  06/20/2012 DATE OF DISCHARGE:                              OPERATIVE REPORT   PROCEDURE PERFORMED:  Insertion of a dual-chamber pacemaker.  INDICATION:  Symptomatic bradycardia.  INTRODUCTION:  The patient is an 78 year old woman with a history of symptomatic bradycardia.  She has had long pauses.  She has coronary artery disease.  She has been unable to take beta-blockers secondary to the bradycardia.  She is now referred for permanent pacemaker insertion secondary to symptomatic bradycardia.  DESCRIPTION OF PROCEDURE:  After informed consent was obtained, the patient was taken to the Diagnostic EP Lab in a fasting state.  After usual preparation and draping, intravenous fentanyl and midazolam were given for sedation.  A 30 mL of lidocaine was infiltrated into the left infraclavicular region.  A 5-cm incision was carried out over this region.  Electrocautery was utilized to dissect down to the fascial plane.  The left subclavian vein was punctured x2 and the Medtronic model 5076, 52 cm active fixation pacing lead, serial #[id number] was advanced into the right ventricle.  The Medtronic model 5076, 45 cm active fixation pacing lead, serial #[id number] was advanced into the right atrium.  Mapping was carried out in the right ventricle.  At the final site, the R-waves measured 11 mV with pacing impedance was 1300 ohms and threshold was a V at 0.5 msec.  There was a large injury current.  There was no diaphragmatic stimulation at 10 V.  With the ventricular lead in satisfactory position, attention was then turned to placement of atrial lead which was placed in anterolateral portion of the right atrium where the P-waves measured 3 mV.  The pacing  impedance was 700 ohms and the threshold 1.2 V at 0.5 msec.  Again, there was a prominent injury current.  A 10 V pacing did not stimulate the diaphragm.  With these satisfactory parameters, the leads were secured to the subpectoral fascia with a figure-of-eight silk suture.  The sewing sleeve was also secured with silk suture.  Electrocautery was utilized to make subcutaneous pocket.  Antibiotic irrigation was utilized to irrigate the pocket and electrocautery was utilized to assure hemostasis.  The Medtronic dual-chamber pacemaker serial [id number] was connected to the atrial and ventricular leads and placed back into the subcutaneous pocket where it was secured with silk suture. The pocket was irrigated with antibiotic irrigation, and the incision was closed with 2-0 and 3-0 Vicryl.  Benzoin and Steri-Strips were painted on the skin, pressure dressing was applied, and the patient was returned to her room in satisfactory condition.  COMPLICATIONS:  There were no immediate procedure complications.  RESULTS:  Demonstrate successful implantation of a Medtronic dual- chamber pacemaker in a patient with symptomatic bradycardia.     Doylene Canning. Ladona Ridgel, MD     GWT/MEDQ  D:  06/20/2012  T:  06/21/2012  Job:  [id number]

## 2012-08-10 ENCOUNTER — Emergency Department (HOSPITAL_COMMUNITY)
Admission: EM | Admit: 2012-08-10 | Discharge: 2012-08-10 | Disposition: A | Discharge status: Skilled Nursing Facility | Payer: Medicare Other | Attending: Emergency Medicine | Admitting: Emergency Medicine

## 2012-08-10 DIAGNOSIS — I2510 Atherosclerotic heart disease of native coronary artery without angina pectoris: Secondary | ICD-10-CM | POA: Insufficient documentation

## 2012-08-10 DIAGNOSIS — Z049 Encounter for examination and observation for unspecified reason: Secondary | ICD-10-CM | POA: Insufficient documentation

## 2012-08-10 DIAGNOSIS — Y9210 Unspecified residential institution as the place of occurrence of the external cause: Secondary | ICD-10-CM | POA: Insufficient documentation

## 2012-08-10 DIAGNOSIS — E119 Type 2 diabetes mellitus without complications: Secondary | ICD-10-CM | POA: Insufficient documentation

## 2012-08-10 DIAGNOSIS — I10 Essential (primary) hypertension: Secondary | ICD-10-CM | POA: Insufficient documentation

## 2012-08-10 DIAGNOSIS — K219 Gastro-esophageal reflux disease without esophagitis: Secondary | ICD-10-CM | POA: Insufficient documentation

## 2012-08-10 DIAGNOSIS — W19XXXA Unspecified fall, initial encounter: Secondary | ICD-10-CM | POA: Insufficient documentation

## 2012-08-10 DIAGNOSIS — Z7982 Long term (current) use of aspirin: Secondary | ICD-10-CM | POA: Insufficient documentation

## 2012-08-10 NOTE — ED Notes (Signed)
As per EMS pt got up without using her walker and fell. Pt has no complaint of pain from fall, No LOC/N/V.VSS.pwd. Pt c/o of chroinic pain to left leg which is currently being treated with medication and PT and SNF

## 2012-08-10 NOTE — ED Notes (Signed)
ZOX:[id number]<AV> Expected date:08/10/12<BR> Expected time: 2:46 AM<BR> Means of arrival:Ambulance<BR> Comments:<BR> fall

## 2012-08-10 NOTE — ED Provider Notes (Signed)
History     CSN: [id number]  Arrival date & time 08/10/12  9562   First MD Initiated Contact with Patient 08/10/12 [phone]      Chief Complaint  Patient presents with  . Fall    (Consider location/radiation/quality/duration/timing/severity/associated sxs/prior treatment) HPI HX per PT< nursing home PT, got up to use the bathroom, using her walker and fell onto her buttocks, required nursing staff to help her up, she denies any pain, injury or trauma, has chronic l leg pain and gets regular physical therapy. She declined transport but nursing home policy required she be evaluated here. She declines any pain meds and requests to be sent back to her home. She ambulates to the bathroom in the ER without difficulty or discomfort.  Past Medical History  Diagnosis Date  . Diabetes mellitus   . Hypertension   . Acid reflux   . Seizures   . Coronary artery disease 2003    60-75%D2, 50% D1, 25% left circ, 20% LAD, normal LVF  . Dyslipidemia   . Stroke   . LBBB (left bundle branch block)     Past Surgical History  Procedure Date  . Cholecystectomy   . Abdominal hysterectomy   . Appendectomy   . Iridectomy   . Mastoidectomy   . Eye surgery     cataracts/ iridectomy    No family history on file.  History  Substance Use Topics  . Smoking status: Never Smoker   . Smokeless tobacco: Never Used  . Alcohol Use: No    OB History    Grav Para Term Preterm Abortions TAB SAB Ect Mult Living                  Review of Systems  Constitutional: Negative for fever and chills.  HENT: Negative for neck pain and neck stiffness.   Eyes: Negative for pain.  Respiratory: Negative for shortness of breath.   Cardiovascular: Negative for chest pain.  Gastrointestinal: Negative for abdominal pain.  Genitourinary: Negative for dysuria.  Musculoskeletal: Negative for back pain.  Skin: Negative for rash.  Neurological: Negative for headaches.  All other systems reviewed and are  negative.    Allergies  Review of patient's allergies indicates no known allergies.  Home Medications   Current Outpatient Rx  Name Route Sig Dispense Refill  . ALCAFTADINE 0.25 % OP SOLN Both Eyes Place 1 drop into both eyes daily.     . ASPIRIN EC 325 MG PO TBEC Oral Take 325 mg by mouth daily.    Marland Kitchen CARBAMAZEPINE 200 MG PO TABS Oral Take 400 mg by mouth 2 (two) times daily.    Marland Kitchen VITAMIN D 1000 UNITS PO TABS Oral Take 1,000 Units by mouth 2 (two) times daily.    Marland Kitchen CLOPIDOGREL BISULFATE 75 MG PO TABS Oral Take 75 mg by mouth daily.    . GLYBURIDE 2.5 MG PO TABS Oral Take 2.5 mg by mouth daily with breakfast.    . ISOSORBIDE MONONITRATE ER 30 MG PO TB24 Oral Take 60 mg by mouth daily.    Marland Kitchen METOPROLOL SUCCINATE ER 25 MG PO TB24 Oral Take 1 tablet (25 mg total) by mouth daily. 30 tablet 11  . OMEPRAZOLE 20 MG PO CPDR Oral Take 20 mg by mouth daily.    Marland Kitchen REFRESH OP Both Eyes Place 1 drop into both eyes daily as needed. For dry eyes    . PRESCRIPTION MEDICATION Both Eyes Place 1 drop into both eyes 4 (four) times daily  as needed. Gentamicin 3mg /ml. For bacteria in eye    . SENNOSIDES-DOCUSATE SODIUM 8.6-50 MG PO TABS Oral Take 1 tablet by mouth 2 (two) times daily.    Marland Kitchen SIMVASTATIN 20 MG PO TABS Oral Take 20 mg by mouth at bedtime.    . TELMISARTAN 80 MG PO TABS Oral Take 80 mg by mouth daily.    Marland Kitchen ALPRAZOLAM 0.25 MG PO TABS Oral Take 0.125 mg by mouth every 8 (eight) hours as needed. For anxiety    . MOMETASONE FUROATE 0.1 % EX OINT Topical Apply 1 application topically daily. To eyelids for 1 week courses when inflammation occurs    . NITROGLYCERIN 0.4 MG SL SUBL Sublingual Place 0.4 mg under the tongue every 5 (five) minutes as needed. For chest pain      BP 157/62  Pulse 59  Temp 97.8 F (36.6 C) (Oral)  Resp 18  SpO2 97%  Physical Exam  Constitutional: She is oriented to person, place, and time. She appears well-developed and well-nourished.  HENT:  Head: Normocephalic and  atraumatic.  Eyes: Conjunctivae and EOM are normal. Pupils are equal, round, and reactive to light.  Neck: Trachea normal. Neck supple.       No midline cervical tenderness or deformity  Cardiovascular: Normal rate, regular rhythm, S1 normal, S2 normal and normal pulses.     No systolic murmur is present   No diastolic murmur is present  Pulses:      Radial pulses are 2+ on the right side, and 2+ on the left side.  Pulmonary/Chest: Effort normal and breath sounds normal. She has no wheezes. She has no rhonchi. She has no rales. She exhibits no tenderness.  Abdominal: Soft. Normal appearance and bowel sounds are normal. There is no tenderness. There is no CVA tenderness and negative Murphy's sign.  Musculoskeletal:       No lumbar or thoracic tenderness  Neurological: She is alert and oriented to person, place, and time. She has normal strength. No cranial nerve deficit or sensory deficit. GCS eye subscore is 4. GCS verbal subscore is 5. GCS motor subscore is 6.  Skin: Skin is warm and dry. No rash noted. She is not diaphoretic.  Psychiatric: Her speech is normal.       Cooperative and appropriate    ED Course  Procedures (including critical care time)  Presentation and exam do not suggest acute injury or indication for emergent imaging.  PT transferred back to her facility. Plan PCP follow up as needed.   MDM   VS and nursing notes reviewed.       Sunnie Nielsen, MD 08/10/12 [phone]

## 2012-12-29 ENCOUNTER — Emergency Department (HOSPITAL_COMMUNITY)
Admission: EM | Admit: 2012-12-29 | Discharge: 2012-12-29 | Disposition: A | Discharge status: Skilled Nursing Facility | Payer: Medicare Other | Attending: Emergency Medicine | Admitting: Emergency Medicine

## 2012-12-29 ENCOUNTER — Emergency Department (HOSPITAL_COMMUNITY): Payer: Medicare Other

## 2012-12-29 ENCOUNTER — Encounter (HOSPITAL_COMMUNITY): Payer: Self-pay | Admitting: Emergency Medicine

## 2012-12-29 DIAGNOSIS — I2510 Atherosclerotic heart disease of native coronary artery without angina pectoris: Secondary | ICD-10-CM | POA: Insufficient documentation

## 2012-12-29 DIAGNOSIS — Z8673 Personal history of transient ischemic attack (TIA), and cerebral infarction without residual deficits: Secondary | ICD-10-CM | POA: Insufficient documentation

## 2012-12-29 DIAGNOSIS — Z7982 Long term (current) use of aspirin: Secondary | ICD-10-CM | POA: Insufficient documentation

## 2012-12-29 DIAGNOSIS — IMO0002 Reserved for concepts with insufficient information to code with codable children: Secondary | ICD-10-CM | POA: Insufficient documentation

## 2012-12-29 DIAGNOSIS — K219 Gastro-esophageal reflux disease without esophagitis: Secondary | ICD-10-CM | POA: Insufficient documentation

## 2012-12-29 DIAGNOSIS — E119 Type 2 diabetes mellitus without complications: Secondary | ICD-10-CM | POA: Insufficient documentation

## 2012-12-29 DIAGNOSIS — Z79899 Other long term (current) drug therapy: Secondary | ICD-10-CM | POA: Insufficient documentation

## 2012-12-29 DIAGNOSIS — Z7902 Long term (current) use of antithrombotics/antiplatelets: Secondary | ICD-10-CM | POA: Insufficient documentation

## 2012-12-29 DIAGNOSIS — S79929A Unspecified injury of unspecified thigh, initial encounter: Secondary | ICD-10-CM | POA: Insufficient documentation

## 2012-12-29 DIAGNOSIS — I447 Left bundle-branch block, unspecified: Secondary | ICD-10-CM | POA: Insufficient documentation

## 2012-12-29 DIAGNOSIS — G40909 Epilepsy, unspecified, not intractable, without status epilepticus: Secondary | ICD-10-CM | POA: Insufficient documentation

## 2012-12-29 DIAGNOSIS — Y939 Activity, unspecified: Secondary | ICD-10-CM | POA: Insufficient documentation

## 2012-12-29 DIAGNOSIS — Z8679 Personal history of other diseases of the circulatory system: Secondary | ICD-10-CM | POA: Insufficient documentation

## 2012-12-29 DIAGNOSIS — E785 Hyperlipidemia, unspecified: Secondary | ICD-10-CM | POA: Insufficient documentation

## 2012-12-29 DIAGNOSIS — M79606 Pain in leg, unspecified: Secondary | ICD-10-CM

## 2012-12-29 DIAGNOSIS — Y929 Unspecified place or not applicable: Secondary | ICD-10-CM | POA: Insufficient documentation

## 2012-12-29 DIAGNOSIS — S79919A Unspecified injury of unspecified hip, initial encounter: Secondary | ICD-10-CM | POA: Insufficient documentation

## 2012-12-29 DIAGNOSIS — I10 Essential (primary) hypertension: Secondary | ICD-10-CM | POA: Insufficient documentation

## 2012-12-29 DIAGNOSIS — R296 Repeated falls: Secondary | ICD-10-CM | POA: Insufficient documentation

## 2012-12-29 MED ORDER — OXYCODONE-ACETAMINOPHEN 5-325 MG PO TABS: 1.0000 | ORAL_TABLET | ORAL | Status: DC | PRN

## 2012-12-29 MED ORDER — OXYCODONE-ACETAMINOPHEN 5-325 MG PO TABS
2.0000 | ORAL_TABLET | Freq: Once | ORAL | Status: AC
  Administered 2012-12-29: 2 via ORAL
  Filled 2012-12-29: qty 2

## 2012-12-29 MED ORDER — LORAZEPAM 2 MG/ML IJ SOLN
0.5000 mg | Freq: Once | INTRAMUSCULAR | Status: AC
  Administered 2012-12-29: 0.5 mg via INTRAVENOUS
  Filled 2012-12-29: qty 1

## 2012-12-29 NOTE — ED Notes (Signed)
Patient transported to CT 

## 2012-12-29 NOTE — ED Notes (Signed)
WUJ:[id number]<JY> Expected date:12/29/12<BR> Expected time:12:21 PM<BR> Means of arrival:Ambulance<BR> Comments:<BR> fall

## 2012-12-29 NOTE — ED Provider Notes (Signed)
History    79 year old female with proximal left posterior thigh pain. Gradual onset about 2 days ago. Patient did have a recent fall but this was 3 weeks ago and patient felt fine the interim. No acute numbness, tingling or loss of strength. Denies any acute pain elsewhere. No rash. No urinary complaints. Has not tried taking anything for pain.  CSN: [id number]  Arrival date & time 12/29/12  1237   First MD Initiated Contact with Patient 12/29/12 1241      Chief Complaint  Patient presents with  . Fall  . Leg Pain    (Consider location/radiation/quality/duration/timing/severity/associated sxs/prior treatment) HPI  Past Medical History  Diagnosis Date  . Diabetes mellitus   . Hypertension   . Acid reflux   . Seizures   . Coronary artery disease 2003    60-75%D2, 50% D1, 25% left circ, 20% LAD, normal LVF  . Dyslipidemia   . Stroke   . LBBB (left bundle branch block)     Past Surgical History  Procedure Date  . Cholecystectomy   . Abdominal hysterectomy   . Appendectomy   . Iridectomy   . Mastoidectomy   . Eye surgery     cataracts/ iridectomy    No family history on file.  History  Substance Use Topics  . Smoking status: Never Smoker   . Smokeless tobacco: Never Used  . Alcohol Use: No    OB History    Grav Para Term Preterm Abortions TAB SAB Ect Mult Living                  Review of Systems  All systems reviewed and negative, other than as noted in HPI.   Allergies  Review of patient's allergies indicates no known allergies.  Home Medications   Current Outpatient Rx  Name  Route  Sig  Dispense  Refill  . ALCAFTADINE 0.25 % OP SOLN   Both Eyes   Place 1 drop into both eyes daily.          Marland Kitchen ALPRAZOLAM 0.25 MG PO TABS   Oral   Take 0.125 mg by mouth every 6 (six) hours as needed. For anxiety         . ASPIRIN EC 325 MG PO TBEC   Oral   Take 325 mg by mouth daily.         Marland Kitchen CARBAMAZEPINE 200 MG PO TABS   Oral   Take 400 mg by  mouth 2 (two) times daily.         Marland Kitchen VITAMIN D 1000 UNITS PO TABS   Oral   Take 1,000 Units by mouth 2 (two) times daily.         Marland Kitchen CLOPIDOGREL BISULFATE 75 MG PO TABS   Oral   Take 75 mg by mouth daily.         Marland Kitchen DIVALPROEX SODIUM 125 MG PO TBEC   Oral   Take 125 mg by mouth 2 (two) times daily.         . GENTAMICIN SULFATE 0.3 % OP SOLN   Both Eyes   Place 1 drop into both eyes 4 (four) times daily as needed. For bacterial infection Use for course of 1 week when condition occurs         . GLYBURIDE 2.5 MG PO TABS   Oral   Take 2.5 mg by mouth daily with breakfast.         . ISOSORBIDE MONONITRATE ER 30 MG PO TB24  Oral   Take 60 mg by mouth daily.         Marland Kitchen METOPROLOL SUCCINATE ER 25 MG PO TB24   Oral   Take 1 tablet (25 mg total) by mouth daily.   30 tablet   11   . MOMETASONE FUROATE 0.1 % EX OINT   Topical   Apply 1 application topically daily. Apply around and to skin of eyelids for 1 week courses when inflammation occurs, do not get this medication in the eyes         . NITROGLYCERIN 0.4 MG SL SUBL   Sublingual   Place 0.4 mg under the tongue every 5 (five) minutes x 3 doses as needed. For chest pain         . OMEPRAZOLE 20 MG PO CPDR   Oral   Take 20 mg by mouth daily before breakfast. Take on empty stomach at 630 am         . REFRESH OP   Both Eyes   Place 1 drop into both eyes daily as needed. For dry eyes         . SENNOSIDES-DOCUSATE SODIUM 8.6-50 MG PO TABS   Oral   Take 1 tablet by mouth 2 (two) times daily.         Marland Kitchen SIMVASTATIN 20 MG PO TABS   Oral   Take 20 mg by mouth at bedtime.         . TELMISARTAN 80 MG PO TABS   Oral   Take 80 mg by mouth daily.           BP 175/63  Pulse 59  Temp 98.7 F (37.1 C) (Oral)  Resp 16  SpO2 99%  Physical Exam  Nursing note and vitals reviewed. Constitutional: She appears well-developed and well-nourished. No distress.  HENT:  Head: Normocephalic and atraumatic.    Eyes: Conjunctivae normal are normal. Right eye exhibits no discharge. Left eye exhibits no discharge.  Neck: Neck supple.  Cardiovascular: Normal rate, regular rhythm and normal heart sounds.  Exam reveals no gallop and no friction rub.   No murmur heard. Pulmonary/Chest: Effort normal and breath sounds normal. No respiratory distress.  Abdominal: Soft. She exhibits no distension. There is no tenderness.  Musculoskeletal: She exhibits no edema and no tenderness.       Tenderness L proximal posterior thigh w/o concerning skin changes. nvi distally.   Neurological: She is alert.  Skin: Skin is warm and dry.  Psychiatric: She has a normal mood and affect. Her behavior is normal. Thought content normal.    ED Course  Procedures (including critical care time)  Labs Reviewed - No data to display Dg Lumbar Spine Complete  12/29/2012  *RADIOLOGY REPORT*  Clinical Data: Fall with low back pain.  LUMBAR SPINE - COMPLETE 4+ VIEW  Comparison: None.  Findings: No acute fracture or subluxation is identified.  Moderate spondylosis present throughout the lumbar spine.  Most significant disc space narrowing is at L3-4.  No focal bony lesions are identified.  IMPRESSION: No acute fracture.  Moderate lumbar spondylosis present.   Original Report Authenticated By: Irish Lack, M.D.    Dg Hip Complete Left  12/29/2012  *RADIOLOGY REPORT*  Clinical Data: Left hip pain.  LEFT HIP - COMPLETE 2+ VIEW  Comparison: None.  Findings: No acute fracture or dislocation.  No significant degenerative changes.  No focal bony lesions or evidence of bony destruction.  The bony pelvis is intact.  No soft tissue abnormalities.  IMPRESSION: No acute findings.   Original Report Authenticated By: Irish Lack, M.D.    Ct Hip Left Wo Contrast  12/29/2012  2*RADIOLOGY REPORT*  Clinical Data: Multiple recent falls.  Left hip injury.  Patient unable to bear weight.  Negative x-rays earlier same date.  CT OF THE LEFT HIP WITHOUT  CONTRAST  Technique:  Multidetector CT imaging was performed according to the standard protocol. Multiplanar CT image reconstructions were also generated.  Comparison: Left hip x-rays earlier same date.  Bone window images from CT pelvis 08/09/2008.  Findings: Calcification immediately anterior and lateral to the left femoral head, consistent with chondrocalcinosis in the acetabular labrum.  No evidence of acute or subacute fracture involving the left proximal femur or the left hemipelvis. Generalized osseous demineralization.  Moderate degenerative changes in the left sacroiliac joint.  Degenerative changes with chondrocalcinosis in the symphysis pubis.  Chondrocalcinosis at the insertion of the hamstring tendon on the ischium.  Moderate joint space narrowing in the left hip.  Soft tissue window images demonstrate extensive sigmoid colon diverticulosis and mild iliofemoral atherosclerosis.  IMPRESSION:  1.  No acute fractures involving the left proximal femur or the left hemipelvis. 2. CPPD, with chondrocalcinosis involving the acetabular labrum, the symphysis pubis, and the hamstring tendon. 3.  Moderate osteoarthritis in the left hip.  The acetabular chondrocalcinosis might account for the patient's pain.   Original Report Authenticated By: Hulan Saas, M.D.      1. Leg pain, posterior       MDM  84yF with L hip/thigh pain. Possibly from chondrocalcinosis. No evidence of fx. Plan symptomatic tx.         Raeford Razor, MD 01/07/13 [phone]

## 2012-12-29 NOTE — ED Notes (Signed)
Per EMS pt comes from [facility] assisted living, where she c/o left lower posterior leg pain that started two days ago. Pt states she fell 3 weeks ago and thinks it related to that.  Per staff at facility pt hurt herself on Thursday in activities, pt doesn't recall falling or hurting herself on Thursday.

## 2012-12-29 NOTE — ED Notes (Addendum)
[facility] notified of pt needing transportation back to assisted living.

## 2013-01-20 ENCOUNTER — Encounter (HOSPITAL_COMMUNITY): Payer: Self-pay | Admitting: Emergency Medicine

## 2013-01-20 ENCOUNTER — Observation Stay (HOSPITAL_COMMUNITY)
Admission: EM | Admit: 2013-01-20 | Discharge: 2013-01-21 | Disposition: A | Discharge status: Skilled Nursing Facility | Payer: Medicare Other | Attending: Internal Medicine | Admitting: Internal Medicine

## 2013-01-20 ENCOUNTER — Emergency Department (HOSPITAL_COMMUNITY): Payer: Medicare Other

## 2013-01-20 ENCOUNTER — Observation Stay (HOSPITAL_COMMUNITY): Payer: Medicare Other

## 2013-01-20 DIAGNOSIS — Z8673 Personal history of transient ischemic attack (TIA), and cerebral infarction without residual deficits: Secondary | ICD-10-CM | POA: Insufficient documentation

## 2013-01-20 DIAGNOSIS — I10 Essential (primary) hypertension: Secondary | ICD-10-CM

## 2013-01-20 DIAGNOSIS — K219 Gastro-esophageal reflux disease without esophagitis: Secondary | ICD-10-CM

## 2013-01-20 DIAGNOSIS — G40909 Epilepsy, unspecified, not intractable, without status epilepticus: Secondary | ICD-10-CM

## 2013-01-20 DIAGNOSIS — Z950 Presence of cardiac pacemaker: Secondary | ICD-10-CM

## 2013-01-20 DIAGNOSIS — E875 Hyperkalemia: Secondary | ICD-10-CM | POA: Insufficient documentation

## 2013-01-20 DIAGNOSIS — I639 Cerebral infarction, unspecified: Secondary | ICD-10-CM

## 2013-01-20 DIAGNOSIS — Z79899 Other long term (current) drug therapy: Secondary | ICD-10-CM | POA: Insufficient documentation

## 2013-01-20 DIAGNOSIS — Z7982 Long term (current) use of aspirin: Secondary | ICD-10-CM | POA: Insufficient documentation

## 2013-01-20 DIAGNOSIS — E119 Type 2 diabetes mellitus without complications: Secondary | ICD-10-CM

## 2013-01-20 DIAGNOSIS — M7660 Achilles tendinitis, unspecified leg: Secondary | ICD-10-CM | POA: Insufficient documentation

## 2013-01-20 DIAGNOSIS — I495 Sick sinus syndrome: Secondary | ICD-10-CM

## 2013-01-20 DIAGNOSIS — R55 Syncope and collapse: Principal | ICD-10-CM

## 2013-01-20 DIAGNOSIS — I2510 Atherosclerotic heart disease of native coronary artery without angina pectoris: Secondary | ICD-10-CM

## 2013-01-20 DIAGNOSIS — I209 Angina pectoris, unspecified: Secondary | ICD-10-CM

## 2013-01-20 DIAGNOSIS — I447 Left bundle-branch block, unspecified: Secondary | ICD-10-CM

## 2013-01-20 DIAGNOSIS — E785 Hyperlipidemia, unspecified: Secondary | ICD-10-CM | POA: Insufficient documentation

## 2013-01-20 LAB — URINALYSIS, ROUTINE W REFLEX MICROSCOPIC
Glucose, UA: NEGATIVE mg/dL
Hgb urine dipstick: NEGATIVE
Ketones, ur: NEGATIVE mg/dL
Protein, ur: NEGATIVE mg/dL

## 2013-01-20 LAB — BASIC METABOLIC PANEL
Calcium: 8.6 mg/dL (ref 8.4–10.5)
GFR calc Af Amer: 60 mL/min — ABNORMAL LOW (ref >90)
GFR calc non Af Amer: 52 mL/min — ABNORMAL LOW (ref >90)
Glucose, Bld: 146 mg/dL — ABNORMAL HIGH (ref 70–99)
Sodium: 132 mEq/L — ABNORMAL LOW (ref 135–145)

## 2013-01-20 LAB — CBC WITH DIFFERENTIAL/PLATELET
Eosinophils Absolute: 0.1 10*3/uL (ref 0.0–0.7)
Lymphs Abs: 1.2 10*3/uL (ref 0.7–4.0)
MCH: 33.8 pg (ref 26.0–34.0)
Neutro Abs: 3.6 10*3/uL (ref 1.7–7.7)
Neutrophils Relative %: 65 % (ref 43–77)
Platelets: 230 10*3/uL (ref 150–400)
RBC: 3.11 MIL/uL — ABNORMAL LOW (ref 3.87–5.11)
WBC: 5.5 10*3/uL (ref 4.0–10.5)

## 2013-01-20 LAB — PROTIME-INR: Prothrombin Time: 13.1 seconds (ref 11.6–15.2)

## 2013-01-20 LAB — GLUCOSE, CAPILLARY: Glucose-Capillary: 95 mg/dL (ref 70–99)

## 2013-01-20 LAB — CREATININE, SERUM
Creatinine, Ser: 0.92 mg/dL (ref 0.50–1.10)
GFR calc Af Amer: 65 mL/min — ABNORMAL LOW (ref >90)
GFR calc non Af Amer: 56 mL/min — ABNORMAL LOW (ref >90)

## 2013-01-20 LAB — CBC
Hemoglobin: 10.1 g/dL — ABNORMAL LOW (ref 12.0–15.0)
MCH: 34 pg (ref 26.0–34.0)
Platelets: 207 10*3/uL (ref 150–400)
RBC: 2.97 MIL/uL — ABNORMAL LOW (ref 3.87–5.11)
WBC: 5.2 10*3/uL (ref 4.0–10.5)

## 2013-01-20 LAB — APTT: aPTT: 27 seconds (ref 24–37)

## 2013-01-20 LAB — TROPONIN I: Troponin I: <0.3 ng/mL (ref <0.30)

## 2013-01-20 MED ORDER — SODIUM CHLORIDE 0.9 % IV SOLN
Freq: Once | INTRAVENOUS | Status: AC
  Administered 2013-01-20: 17:00:00 via INTRAVENOUS

## 2013-01-20 MED ORDER — CLOPIDOGREL BISULFATE 75 MG PO TABS
75.0000 mg | ORAL_TABLET | Freq: Every day | ORAL | Status: DC
  Administered 2013-01-21: 75 mg via ORAL
  Filled 2013-01-20 (×2): qty 1

## 2013-01-20 MED ORDER — SODIUM CHLORIDE 0.9 % IJ SOLN
3.0000 mL | Freq: Two times a day (BID) | INTRAMUSCULAR | Status: DC
  Administered 2013-01-20 – 2013-01-21 (×2): 3 mL via INTRAVENOUS

## 2013-01-20 MED ORDER — ONDANSETRON HCL 4 MG/2ML IJ SOLN: 4.0000 mg | Freq: Three times a day (TID) | INTRAMUSCULAR | Status: AC | PRN

## 2013-01-20 MED ORDER — SIMVASTATIN 20 MG PO TABS
20.0000 mg | ORAL_TABLET | Freq: Every day | ORAL | Status: DC
  Administered 2013-01-20: 20 mg via ORAL
  Filled 2013-01-20 (×2): qty 1

## 2013-01-20 MED ORDER — ALPRAZOLAM 0.25 MG PO TABS: 0.2500 mg | ORAL_TABLET | Freq: Four times a day (QID) | ORAL | Status: DC | PRN

## 2013-01-20 MED ORDER — ISOSORBIDE MONONITRATE ER 60 MG PO TB24
60.0000 mg | ORAL_TABLET | Freq: Every day | ORAL | Status: DC
  Administered 2013-01-21: 60 mg via ORAL
  Filled 2013-01-20: qty 1

## 2013-01-20 MED ORDER — ENOXAPARIN SODIUM 40 MG/0.4ML ~~LOC~~ SOLN
40.0000 mg | Freq: Every day | SUBCUTANEOUS | Status: DC
  Administered 2013-01-20: 40 mg via SUBCUTANEOUS
  Filled 2013-01-20 (×2): qty 0.4

## 2013-01-20 MED ORDER — CARBAMAZEPINE 200 MG PO TABS
400.0000 mg | ORAL_TABLET | Freq: Two times a day (BID) | ORAL | Status: DC
  Administered 2013-01-20 – 2013-01-21 (×2): 400 mg via ORAL
  Filled 2013-01-20 (×3): qty 2

## 2013-01-20 MED ORDER — METOPROLOL SUCCINATE ER 25 MG PO TB24
25.0000 mg | ORAL_TABLET | Freq: Every day | ORAL | Status: DC
  Administered 2013-01-21: 25 mg via ORAL
  Filled 2013-01-20: qty 1

## 2013-01-20 MED ORDER — SODIUM CHLORIDE 0.9 % IV SOLN
INTRAVENOUS | Status: AC
  Administered 2013-01-20: 23:00:00 via INTRAVENOUS

## 2013-01-20 MED ORDER — DIVALPROEX SODIUM 125 MG PO DR TAB
125.0000 mg | DELAYED_RELEASE_TABLET | Freq: Two times a day (BID) | ORAL | Status: DC
  Administered 2013-01-20 – 2013-01-21 (×2): 125 mg via ORAL
  Filled 2013-01-20 (×3): qty 1

## 2013-01-20 MED ORDER — ASPIRIN EC 325 MG PO TBEC
325.0000 mg | DELAYED_RELEASE_TABLET | Freq: Every day | ORAL | Status: DC
  Administered 2013-01-21: 325 mg via ORAL
  Filled 2013-01-20 (×2): qty 1

## 2013-01-20 MED ORDER — GLYBURIDE 2.5 MG PO TABS
2.5000 mg | ORAL_TABLET | Freq: Every day | ORAL | Status: DC
  Administered 2013-01-21: 2.5 mg via ORAL
  Filled 2013-01-20 (×2): qty 1

## 2013-01-20 NOTE — ED Notes (Signed)
Patient transported to CT 

## 2013-01-20 NOTE — H&P (Signed)
PCP:   Pearla Dubonnet, MD   Chief Complaint:  Passed out  HPI: 79 yo female who was at her snf sitting in a chair during a meeting when she had a syncopal event.  She does not remember anything, just suddenly passed out while sitting there.  No cp no sob.  No recent illnesses. No n/v/d.  No focal neuro def.  Event last less than 2 minutes.  Feels back to normal now.  Has pacer which was interrogated in the ED which was negative.  Review of Systems:  Positive and negative as per HPI otherwise all other systems are negative  Past Medical History: Past Medical History  Diagnosis Date  . Diabetes mellitus   . Hypertension   . Acid reflux   . Seizures   . Coronary artery disease 2003    60-75%D2, 50% D1, 25% left circ, 20% LAD, normal LVF  . Dyslipidemia   . Stroke   . LBBB (left bundle branch block)    Past Surgical History  Procedure Laterality Date  . Cholecystectomy    . Abdominal hysterectomy    . Appendectomy    . Iridectomy    . Mastoidectomy    . Eye surgery      cataracts/ iridectomy    Medications: Prior to Admission medications   Medication Sig Start Date End Date Taking? Authorizing Provider  Alcaftadine (LASTACAFT) 0.25 % SOLN Place 1 drop into both eyes daily.    Yes Historical Provider, MD  ALPRAZolam (XANAX) 0.25 MG tablet Take 0.25 mg by mouth every 6 (six) hours as needed for anxiety.    Yes Historical Provider, MD  aspirin EC 325 MG tablet Take 325 mg by mouth daily.   Yes Historical Provider, MD  carbamazepine (TEGRETOL) 200 MG tablet Take 400 mg by mouth 2 (two) times daily.   Yes Historical Provider, MD  cholecalciferol (VITAMIN D) 1000 UNITS tablet Take 1,000 Units by mouth 2 (two) times daily.   Yes Historical Provider, MD  clopidogrel (PLAVIX) 75 MG tablet Take 75 mg by mouth daily.   Yes Historical Provider, MD  divalproex (DEPAKOTE) 125 MG DR tablet Take 125 mg by mouth 2 (two) times daily.   Yes Historical Provider, MD  gentamicin  (GARAMYCIN) 0.3 % ophthalmic solution Place 1 drop into both eyes 4 (four) times daily as needed. For bacterial infection Use for course of 1 week when condition occurs   Yes Historical Provider, MD  glyBURIDE (DIABETA) 2.5 MG tablet Take 2.5 mg by mouth daily with breakfast.   Yes Historical Provider, MD  isosorbide mononitrate (IMDUR) 30 MG 24 hr tablet Take 60 mg by mouth daily.   Yes Historical Provider, MD  metoprolol succinate (TOPROL XL) 25 MG 24 hr tablet Take 1 tablet (25 mg total) by mouth daily. 06/21/12 06/21/13 Yes Lyn Records III, MD  mometasone (ELOCON) 0.1 % ointment Apply 1 application topically daily. Apply around and to skin of eyelids for 1 week courses when inflammation occurs, do not get this medication in the eyes   Yes Historical Provider, MD  nitroGLYCERIN (NITROSTAT) 0.4 MG SL tablet Place 0.4 mg under the tongue every 5 (five) minutes x 3 doses as needed. For chest pain   Yes Historical Provider, MD  omeprazole (PRILOSEC) 20 MG capsule Take 20 mg by mouth daily before breakfast. Take on empty stomach at 630 am   Yes Historical Provider, MD  oxyCODONE-acetaminophen (PERCOCET/ROXICET) 5-325 MG per tablet Take 1-2 tablets by mouth every 4 (four)  hours as needed for pain. 12/29/12  Yes Raeford Razor, MD  senna-docusate (SENOKOT-S) 8.6-50 MG per tablet Take 1 tablet by mouth 2 (two) times daily.   Yes Historical Provider, MD  simvastatin (ZOCOR) 20 MG tablet Take 20 mg by mouth at bedtime.   Yes Historical Provider, MD  telmisartan (MICARDIS) 80 MG tablet Take 80 mg by mouth daily.   Yes Historical Provider, MD    Allergies:  No Known Allergies  Social History:  reports that she has never smoked. She has never used smokeless tobacco. She reports that she does not drink alcohol or use illicit drugs.  Family History: History reviewed. No pertinent family history.  Physical Exam: Filed Vitals:   01/20/13 1516 01/20/13 1518 01/20/13 1930  BP:  131/58 187/85  Pulse:  60 65   Temp:  97.9 F (36.6 C)   TempSrc:  Oral   Resp:  18 18  SpO2: 100% 97% 98%   General appearance: alert, cooperative and no distress Neck: no JVD and supple, symmetrical, trachea midline Lungs: clear to auscultation bilaterally Heart: regular rate and rhythm, S1, S2 normal, no murmur, click, rub or gallop Abdomen: soft, non-tender; bowel sounds normal; no masses,  no organomegaly Extremities: extremities normal, atraumatic, no cyanosis or edema Pulses: 2+ and symmetric Skin: Skin color, texture, turgor normal. No rashes or lesions Neurologic: Grossly normal    Labs on Admission:   Recent Labs  01/20/13 1656  NA 132*  K 5.2*  CL 97  CO2 24  GLUCOSE 146*  BUN 24*  CREATININE 0.99  CALCIUM 8.6    Recent Labs  01/20/13 1656  WBC 5.5  NEUTROABS 3.6  HGB 10.5*  HCT 31.3*  MCV 100.6*  PLT 230    Recent Labs  01/20/13 1656  TROPONINI <0.30    Radiological Exams on Admission: Dg Chest Port 1 View  01/20/2013  *RADIOLOGY REPORT*  Clinical Data: Loss of consciousness.  Hypertension.  PORTABLE CHEST - 1 VIEW  Comparison: 06/21/2012  Findings: Dual lead pacemaker is in place.  Artifact overlies chest.  Heart size is normal.  The vascularity is normal.  Lungs are clear with mild scarring in the lingula.  No effusions.  No significant bony finding.  IMPRESSION: No active disease.  Dual lead pacemaker.  Lingular scarring.   Original Report Authenticated By: Paulina Fusi, M.D.     Assessment/Plan 79 yo female with syncopal episode  Principal Problem:   Syncope Active Problems:   Pacemaker-[employer]   Coronary artery disease   Hypertension   Seizure disorder  Exam benign.  Cth old infarcts.  Had stress done 7/13 showed old ishemic changes being med managed.  Ef over 40%.  No fever or source of infection.  History not c/w seizure but she does have h/o sz/  obs overnight.  Monitor on tele.  Ck carotid dopplers.  Ck orthostatics.    Harjot Dibello A 01/20/2013, 8:17  PM

## 2013-01-20 NOTE — ED Provider Notes (Signed)
History     CSN: [id number]  Arrival date & time 01/20/13  1515   First MD Initiated Contact with Patient 01/20/13 1534      Chief Complaint  Patient presents with  . Near Syncope    (Consider location/radiation/quality/duration/timing/severity/associated sxs/prior treatment) HPI Comments: 79 year old female history of diabetes, hypertension, coronary disease and seizure disorder with a history of a left bundle branch block and pacemaker placement in the past who presents with a complaint of syncope. The patient states that she has not been feeling well for the last couple of days, while she was attending a meeting at her nursing facility she had a syncopal event while she was sitting in a chair, she was lowered to the ground and did not fall, did not strike her head. There is an unknown length of time that she had loss of consciousness, when she came back to she seemed to be at her baseline mental status, no seizure activity was witnessed per EMS, no tongue biting, no urinary incontinence. The patient denies having any specific complaints just "not feeling good". The syncopal event was acute in onset, resolve spontaneously, not associated with nausea vomiting fevers chills cough or shortness of breath. There were no prodromal symptoms other than "not feeling good". The syncopal event occurred just prior to the emergency department  The history is provided by the patient, the EMS personnel and the nursing home.    Past Medical History  Diagnosis Date  . Diabetes mellitus   . Hypertension   . Acid reflux   . Seizures   . Coronary artery disease 2003    60-75%D2, 50% D1, 25% left circ, 20% LAD, normal LVF  . Dyslipidemia   . Stroke   . LBBB (left bundle branch block)     Past Surgical History  Procedure Laterality Date  . Cholecystectomy    . Abdominal hysterectomy    . Appendectomy    . Iridectomy    . Mastoidectomy    . Eye surgery      cataracts/ iridectomy    History  reviewed. No pertinent family history.  History  Substance Use Topics  . Smoking status: Never Smoker   . Smokeless tobacco: Never Used  . Alcohol Use: No    OB History   Grav Para Term Preterm Abortions TAB SAB Ect Mult Living                  Review of Systems  All other systems reviewed and are negative.    Allergies  Review of patient's allergies indicates no known allergies.  Home Medications   Current Outpatient Rx  Name  Route  Sig  Dispense  Refill  . Alcaftadine (LASTACAFT) 0.25 % SOLN   Both Eyes   Place 1 drop into both eyes daily.          Marland Kitchen ALPRAZolam (XANAX) 0.25 MG tablet   Oral   Take 0.25 mg by mouth every 6 (six) hours as needed for anxiety.          Marland Kitchen aspirin EC 325 MG tablet   Oral   Take 325 mg by mouth daily.         . carbamazepine (TEGRETOL) 200 MG tablet   Oral   Take 400 mg by mouth 2 (two) times daily.         . cholecalciferol (VITAMIN D) 1000 UNITS tablet   Oral   Take 1,000 Units by mouth 2 (two) times daily.         Marland Kitchen  clopidogrel (PLAVIX) 75 MG tablet   Oral   Take 75 mg by mouth daily.         . divalproex (DEPAKOTE) 125 MG DR tablet   Oral   Take 125 mg by mouth 2 (two) times daily.         Marland Kitchen gentamicin (GARAMYCIN) 0.3 % ophthalmic solution   Both Eyes   Place 1 drop into both eyes 4 (four) times daily as needed. For bacterial infection Use for course of 1 week when condition occurs         . glyBURIDE (DIABETA) 2.5 MG tablet   Oral   Take 2.5 mg by mouth daily with breakfast.         . isosorbide mononitrate (IMDUR) 30 MG 24 hr tablet   Oral   Take 60 mg by mouth daily.         . metoprolol succinate (TOPROL XL) 25 MG 24 hr tablet   Oral   Take 1 tablet (25 mg total) by mouth daily.   30 tablet   11   . mometasone (ELOCON) 0.1 % ointment   Topical   Apply 1 application topically daily. Apply around and to skin of eyelids for 1 week courses when inflammation occurs, do not get this  medication in the eyes         . nitroGLYCERIN (NITROSTAT) 0.4 MG SL tablet   Sublingual   Place 0.4 mg under the tongue every 5 (five) minutes x 3 doses as needed. For chest pain         . omeprazole (PRILOSEC) 20 MG capsule   Oral   Take 20 mg by mouth daily before breakfast. Take on empty stomach at 630 am         . oxyCODONE-acetaminophen (PERCOCET/ROXICET) 5-325 MG per tablet   Oral   Take 1-2 tablets by mouth every 4 (four) hours as needed for pain.   30 tablet   0   . senna-docusate (SENOKOT-S) 8.6-50 MG per tablet   Oral   Take 1 tablet by mouth 2 (two) times daily.         . simvastatin (ZOCOR) 20 MG tablet   Oral   Take 20 mg by mouth at bedtime.         Marland Kitchen telmisartan (MICARDIS) 80 MG tablet   Oral   Take 80 mg by mouth daily.           BP 187/85  Pulse 65  Temp(Src) 97.9 F (36.6 C) (Oral)  Resp 18  SpO2 98%  Physical Exam  Nursing note and vitals reviewed. Constitutional: She appears well-developed and well-nourished. No distress.  HENT:  Head: Normocephalic and atraumatic.  Mouth/Throat: Oropharynx is clear and moist. No oropharyngeal exudate.  Eyes: Conjunctivae and EOM are normal. Pupils are equal, round, and reactive to light. Right eye exhibits no discharge. Left eye exhibits no discharge. No scleral icterus.  Neck: Normal range of motion. Neck supple. No JVD present. No thyromegaly present.  Cardiovascular: Normal rate, regular rhythm, normal heart sounds and intact distal pulses.  Exam reveals no gallop and no friction rub.   No murmur heard. Pulmonary/Chest: Effort normal and breath sounds normal. No respiratory distress. She has no wheezes. She has no rales.  Abdominal: Soft. Bowel sounds are normal. She exhibits no distension and no mass. There is no tenderness.  Musculoskeletal: Normal range of motion. She exhibits no edema and no tenderness.  Lymphadenopathy:    She has no cervical  adenopathy.  Neurological: She is alert.  Coordination normal.  Skin: Skin is warm and dry. No rash noted. No erythema.  Psychiatric: She has a normal mood and affect. Her behavior is normal.    ED Course  Procedures (including critical care time)  Labs Reviewed  BASIC METABOLIC PANEL - Abnormal; Notable for the following:    Sodium 132 (*)    Potassium 5.2 (*)    Glucose, Bld 146 (*)    BUN 24 (*)    GFR calc non Af Amer 52 (*)    GFR calc Af Amer 60 (*)    All other components within normal limits  CBC WITH DIFFERENTIAL - Abnormal; Notable for the following:    RBC 3.11 (*)    Hemoglobin 10.5 (*)    HCT 31.3 (*)    MCV 100.6 (*)    All other components within normal limits  APTT  PROTIME-INR  TROPONIN I  URINALYSIS, ROUTINE W REFLEX MICROSCOPIC   Dg Chest Port 1 View  01/20/2013  *RADIOLOGY REPORT*  Clinical Data: Loss of consciousness.  Hypertension.  PORTABLE CHEST - 1 VIEW  Comparison: 06/21/2012  Findings: Dual lead pacemaker is in place.  Artifact overlies chest.  Heart size is normal.  The vascularity is normal.  Lungs are clear with mild scarring in the lingula.  No effusions.  No significant bony finding.  IMPRESSION: No active disease.  Dual lead pacemaker.  Lingular scarring.   Original Report Authenticated By: Paulina Fusi, M.D.      1. Syncope       MDM  The patient is reluctant to move her left leg secondary to pain around her distal thigh. This is a pain that she's had for some time. There is no significant tenderness or deformities of the leg at that site. Otherwise her mental status is normal, she follows commands without difficulty, has no limb or truncal ataxia and her cranial nerves III through XII appear to be intact. Her cardiac exam shows that she is in a paced rhythm, soft murmur, normal pulses, no peripheral edema. Workup for syncopal event, no definite etiology based on history.  ED ECG REPORT  I personally interpreted this EKG   Date: 01/20/2013   Rate: 60  Rhythm: Electronic paced  rhythm  QRS Axis: normal  Intervals: Electronic paced rhythm  ST/T Wave abnormalities: nonspecific ST/T changes  Conduction Disutrbances:nonspecific intraventricular conduction delay  Narrative Interpretation:   Old EKG Reviewed: changes noted compared with 06/21/2002, pacer spikes now seen   According to [employer] rep after interrogation with no arrhythmias.  Due to pt's syncopal episode, history of MultMed poblems, will admit  Discussed with Dr. Onalee Hua, will admit   Vida Roller, MD 01/20/13 2014

## 2013-01-20 NOTE — ED Notes (Addendum)
Pt  Here via ems s/p syncopal episode Pt stated that she felt faint and staff help bring her down to the floor denies loc quickly recovered.pt is from [facility]

## 2013-01-20 NOTE — ED Notes (Signed)
[id number]<AV> Expected date:<BR> Expected time:<BR> Means of arrival:<BR> Comments:<BR> Syncopal episode

## 2013-01-21 DIAGNOSIS — I6350 Cerebral infarction due to unspecified occlusion or stenosis of unspecified cerebral artery: Secondary | ICD-10-CM

## 2013-01-21 DIAGNOSIS — I2510 Atherosclerotic heart disease of native coronary artery without angina pectoris: Secondary | ICD-10-CM

## 2013-01-21 DIAGNOSIS — R55 Syncope and collapse: Secondary | ICD-10-CM

## 2013-01-21 LAB — CBC
MCH: 33.8 pg (ref 26.0–34.0)
MCV: 101.4 fL — ABNORMAL HIGH (ref 78.0–100.0)
Platelets: 190 10*3/uL (ref 150–400)
RBC: 2.87 MIL/uL — ABNORMAL LOW (ref 3.87–5.11)
RDW: 12.8 % (ref 11.5–15.5)

## 2013-01-21 LAB — BASIC METABOLIC PANEL
Calcium: 8.1 mg/dL — ABNORMAL LOW (ref 8.4–10.5)
Creatinine, Ser: 0.93 mg/dL (ref 0.50–1.10)
GFR calc non Af Amer: 56 mL/min — ABNORMAL LOW (ref >90)
Glucose, Bld: 95 mg/dL (ref 70–99)
Sodium: 138 mEq/L (ref 135–145)

## 2013-01-21 LAB — TROPONIN I: Troponin I: <0.3 ng/mL (ref <0.30)

## 2013-01-21 LAB — MRSA PCR SCREENING: MRSA by PCR: POSITIVE — AB

## 2013-01-21 MED ORDER — TELMISARTAN 80 MG PO TABS: 40.0000 mg | ORAL_TABLET | Freq: Every day | ORAL | Status: DC

## 2013-01-21 MED ORDER — METOPROLOL SUCCINATE ER 25 MG PO TB24: 50.0000 mg | ORAL_TABLET | Freq: Every day | ORAL | Status: DC

## 2013-01-21 MED ORDER — CHLORHEXIDINE GLUCONATE CLOTH 2 % EX PADS
6.0000 | MEDICATED_PAD | Freq: Every day | CUTANEOUS | Status: DC
  Administered 2013-01-21: 6 via TOPICAL

## 2013-01-21 MED ORDER — MUPIROCIN 2 % EX OINT
1.0000 | TOPICAL_OINTMENT | Freq: Two times a day (BID) | CUTANEOUS | Status: DC
  Administered 2013-01-21: 1 via NASAL
  Filled 2013-01-21: qty 22

## 2013-01-21 NOTE — Progress Notes (Signed)
*  PRELIMINARY RESULTS* Vascular Ultrasound Carotid Duplex (Doppler) has been completed.  Preliminary findings: Bilateral:  No evidence of hemodynamically significant internal carotid artery stenosis.   Vertebral artery flow is antegrade.      Farrel Demark, RDMS, RVT  01/21/2013, 10:04 AM

## 2013-01-21 NOTE — Progress Notes (Signed)
Patient is set to discharge back to [facility] today. Patient & facilty aware. ALF will pick-up patient @ 12:15 - RN, Kendal Hymen aware. Discharge packet given to nurse.   Unice Bailey, LCSW [facility] Clinical Social Worker cell #: [phone]

## 2013-01-21 NOTE — Progress Notes (Signed)
Clinical Social Work Department BRIEF PSYCHOSOCIAL ASSESSMENT 01/21/2013  Patient:  [name] [name],[name] [name]     Account Number:  [id number]     Admit date:  01/20/2013  Clinical Social Worker:  Orpah Greek  Date/Time:  01/21/2013 11:42 AM  Referred by:  Physician  Date Referred:  01/21/2013 Referred for  Other - See comment   Other Referral:   Admitted from: [facility]   Interview type:  Patient Other interview type:    PSYCHOSOCIAL DATA Living Status:  FACILITY Admitted from facility:  [facility] Level of care:  Assisted Living Primary support name:  Dahlia Client (daughter) ph#: [phone] Primary support relationship to patient:  CHILD, ADULT Degree of support available:   good    CURRENT CONCERNS Current Concerns  Post-Acute Placement   Other Concerns:    SOCIAL WORK ASSESSMENT / PLAN CSW received consult that patient was admitted from [facility], plans to return there at discharge.   Assessment/plan status:  Information/Referral to [facility] Other assessment/ plan:   Information/referral to community resources:   CSW completed FL2 and faxed information to [facility], confirmed with Britta Mccreedy @ ALF that patient is ok to return when ready.    PATIENT'S/FAMILY'S RESPONSE TO PLAN OF CARE: Patient states that she's been pleased with the care @ [facility]. Anticipating discharge later today once labs come back.        Unice Bailey, LCSW [facility] Clinical Social Worker cell #: [phone]

## 2013-01-21 NOTE — Discharge Summary (Signed)
Physician Discharge Summary  [name] [name] ZOX:[id number] DOB: 11/22/1928 DOA: 01/20/2013  PCP: Pearla Dubonnet, MD  Admit date: 01/20/2013 Discharge date: 01/21/2013  Recommendations for Outpatient Follow-up:  1. Suggest PCP follow up within 2 weeks for blood pressure and BMP after adjusting ARB and beta blocker.  Please discuss heel pain if not improving with rest, ice as needed and elevation.   2. Physical therapy for gait assessment and training 3. Weight bearing as tolerated left lower extremity  Discharge Diagnoses:  Principal Problem:   Syncope Active Problems:   Pacemaker-[employer]   Coronary artery disease   Hypertension   Seizure disorder   Discharge Condition: stable, improved  Diet recommendation: carbohydrate controlled  Wt Readings from Last 3 Encounters:  01/20/13 75.7 kg (166 lb 14.2 oz)  06/18/12 78.2 kg (172 lb 6.4 oz)  06/18/12 78.2 kg (172 lb 6.4 oz)    History of present illness:   79 yo female who was at her snf sitting in a chair during a meeting when she had a syncopal event. She does not remember anything, just suddenly passed out while sitting there. No cp no sob. No recent illnesses. No n/v/d. No focal neuro def. Event last less than 2 minutes. Feels back to normal now. Has pacer which was interrogated in the ED which was negative.   Hospital Course:   Ms. Hogrefe presented with a syncopal episode.  She did not have any witnessed seizure like activity and did not have a post-ictal state.  Urinalysis and CXR were negative.  CT demonstrated atrophy and nonspecific white matter changes and stable appearance of previous infarcts, but no areas concerning for hemorrhage or acute stroke.  Troponins were negative x 3.  She was hydrated and orthostatics post hydration were negative and she did not have any symptoms of lightheadedness with standing up.  Carotid duplex was preliminarily negative with antegrade vertebral flow.  She should minimize her use of  benzodiazepines and narcotics and stay hydrated to reduce the risk of further syncopal episodes.  She should follow up with her primary care doctor in 2 weeks or sooner if needed.    Hyperkalemia, mild and asymptomatic.  Recommend decreasing dose of telmisartan to 40mg  and increasing metoprolol to 50mg . Repeat blood pressure and potassium in 2 weeks.    Achilles tendonitis:  Recommend rest and ice as needed and weight bearing as tolerated.  Will attempt to defer NSAIDS given age and CKD.  If no improvement, consider Axcel Horsch course of naproxen.    Procedures:  Carotid duplex  Consultations:  none  Discharge Exam: Filed Vitals:   01/21/13 0840  BP: 163/70  Pulse: 60  Temp:   Resp:    Filed Vitals:   01/21/13 0835 01/21/13 0840 01/21/13 0900 01/21/13 1023  BP: 154/43 163/70    Pulse: 61 60    Temp:      TempSrc:      Resp:      Height:      Weight:      SpO2:   99% 99%    General: Caucasian female, no acute distress, lying in bed HEENT:  Eyes with some crusting around the lids.  MMM Cardiovascular: RRR, no mrg, 2+ pulses, warm extremities Respiratory: CTAB, no increased wob ABD:  NABS, soft, ND/NT MSK:  TTP along the left achilles tendon about 2cm above the calcaneous.  Normal plantar flexion and strength.    Discharge Instructions      Discharge Orders   Future Orders Complete By Expires     (  HEART FAILURE PATIENTS) Call MD:  Anytime you have any of the following symptoms: 1) 3 pound weight gain in 24 hours or 5 pounds in 1 week 2) shortness of breath, with or without a dry hacking cough 3) swelling in the hands, feet or stomach 4) if you have to sleep on extra pillows at night in order to breathe.  As directed     Call MD for:  difficulty breathing, headache or visual disturbances  As directed     Call MD for:  extreme fatigue  As directed     Call MD for:  hives  As directed     Call MD for:  persistant dizziness or light-headedness  As directed     Call MD for:   persistant nausea and vomiting  As directed     Call MD for:  severe uncontrolled pain  As directed     Call MD for:  temperature >100.4  As directed     Diet Carb Modified  As directed     Discharge instructions  As directed     Comments:      Please read through the information about syncope thoroughly.  You were tested for infections, including pneumonia and urinary tract infection, which were negative.  Your pacemaker appears to be functioning normally.  Your arteries to your brain are open.  You were hydrated and you felt better.  Please make sure to drink plenty of fluids.  You were found to have a mildly elevated potassium, which can be a side effect of one of your blood pressure medications, the losartan.  I will increase your metoprolol and decrease your losartan to help decrease your potassium slightly.  Please minimize your use of xanax and pain medications as tolerated as these can cause lightheadedness and syncope.  Please rest your left ankle and use ice as needed for pain.  You may bear weight on the left leg as tolerated.  Please follow up with your primary care doctor within 2 weeks of discharge for repeat bloodwork and blood pressure and ankle exam.    Increase activity slowly  As directed         Medication List    TAKE these medications       ALPRAZolam 0.25 MG tablet  Commonly known as:  XANAX  Take 0.25 mg by mouth every 6 (six) hours as needed for anxiety.     aspirin EC 325 MG tablet  Take 325 mg by mouth daily.     carbamazepine 200 MG tablet  Commonly known as:  TEGRETOL  Take 400 mg by mouth 2 (two) times daily.     cholecalciferol 1000 UNITS tablet  Commonly known as:  VITAMIN D  Take 1,000 Units by mouth 2 (two) times daily.     clopidogrel 75 MG tablet  Commonly known as:  PLAVIX  Take 75 mg by mouth daily.     divalproex 125 MG DR tablet  Commonly known as:  DEPAKOTE  Take 125 mg by mouth 2 (two) times daily.     gentamicin 0.3 % ophthalmic  solution  Commonly known as:  GARAMYCIN  Place 1 drop into both eyes 4 (four) times daily as needed. For bacterial infection  Use for course of 1 week when condition occurs     glyBURIDE 2.5 MG tablet  Commonly known as:  DIABETA  Take 2.5 mg by mouth daily with breakfast.     isosorbide mononitrate 30 MG 24 hr tablet  Commonly known as:  IMDUR  Take 60 mg by mouth daily.     LASTACAFT 0.25 % Soln  Generic drug:  Alcaftadine  Place 1 drop into both eyes daily.     metoprolol succinate 25 MG 24 hr tablet  Commonly known as:  TOPROL XL  Take 2 tablets (50 mg total) by mouth daily.     mometasone 0.1 % ointment  Commonly known as:  ELOCON  Apply 1 application topically daily. Apply around and to skin of eyelids for 1 week courses when inflammation occurs, do not get this medication in the eyes     nitroGLYCERIN 0.4 MG SL tablet  Commonly known as:  NITROSTAT  Place 0.4 mg under the tongue every 5 (five) minutes x 3 doses as needed. For chest pain     omeprazole 20 MG capsule  Commonly known as:  PRILOSEC  Take 20 mg by mouth daily before breakfast. Take on empty stomach at 630 am     oxyCODONE-acetaminophen 5-325 MG per tablet  Commonly known as:  PERCOCET/ROXICET  Take 1-2 tablets by mouth every 4 (four) hours as needed for pain.     senna-docusate 8.6-50 MG per tablet  Commonly known as:  Senokot-S  Take 1 tablet by mouth 2 (two) times daily.     simvastatin 20 MG tablet  Commonly known as:  ZOCOR  Take 20 mg by mouth at bedtime.     telmisartan 80 MG tablet  Commonly known as:  MICARDIS  Take 0.5 tablets (40 mg total) by mouth daily.       Follow-up Information   Follow up with GATES,ROBERT NEVILL, MD In 2 weeks.   Contact information:   [address] [place] [address] [phone]        The results of significant diagnostics from this hospitalization (including imaging, microbiology, ancillary and laboratory) are listed below for  reference.    Significant Diagnostic Studies: Dg Lumbar Spine Complete  12/29/2012  *RADIOLOGY REPORT*  Clinical Data: Fall with low back pain.  LUMBAR SPINE - COMPLETE 4+ VIEW  Comparison: None.  Findings: No acute fracture or subluxation is identified.  Moderate spondylosis present throughout the lumbar spine.  Most significant disc space narrowing is at L3-4.  No focal bony lesions are identified.  IMPRESSION: No acute fracture.  Moderate lumbar spondylosis present.   Original Report Authenticated By: Irish Lack, M.D.    Dg Hip Complete Left  12/29/2012  *RADIOLOGY REPORT*  Clinical Data: Left hip pain.  LEFT HIP - COMPLETE 2+ VIEW  Comparison: None.  Findings: No acute fracture or dislocation.  No significant degenerative changes.  No focal bony lesions or evidence of bony destruction.  The bony pelvis is intact.  No soft tissue abnormalities.  IMPRESSION: No acute findings.   Original Report Authenticated By: Irish Lack, M.D.    Ct Head Wo Contrast  01/20/2013  *RADIOLOGY REPORT*  Clinical Data: Syncope, seizure disorder.  CT HEAD WITHOUT CONTRAST  Technique:  Contiguous axial images were obtained from the base of the skull through the vertex without contrast.  Comparison: 02/09/2011  Findings: Old left occipital    and   right posterior   parietal lobe infarcts.    Atherosclerotic and physiologic intracranial calcifications.  Postop changes in the right mastoid air cells. Partial opacification of the right maxillary and patchy ethmoid air cells. Diffuse parenchymal atrophy. Patchy areas of hypoattenuation in deep and periventricular white matter bilaterally. Negative for acute intracranial hemorrhage, mass  lesion, acute infarction, midline shift, or mass-effect. Acute infarct may be inapparent on noncontrast CT. Ventricles and sulci symmetric. Bone windows demonstrate no focal lesion.  IMPRESSION:  1. Negative for bleed or other acute intracranial process.  2. Atrophy and nonspecific white  matter changes. 3.  Stable appearance of old left occipital and right parietal lobe infarcts.   Original Report Authenticated By: D. Andria Rhein, MD    Ct Hip Left Wo Contrast  12/29/2012  2*RADIOLOGY REPORT*  Clinical Data: Multiple recent falls.  Left hip injury.  Patient unable to bear weight.  Negative x-rays earlier same date.  CT OF THE LEFT HIP WITHOUT CONTRAST  Technique:  Multidetector CT imaging was performed according to the standard protocol. Multiplanar CT image reconstructions were also generated.  Comparison: Left hip x-rays earlier same date.  Bone window images from CT pelvis 08/09/2008.  Findings: Calcification immediately anterior and lateral to the left femoral head, consistent with chondrocalcinosis in the acetabular labrum.  No evidence of acute or subacute fracture involving the left proximal femur or the left hemipelvis. Generalized osseous demineralization.  Moderate degenerative changes in the left sacroiliac joint.  Degenerative changes with chondrocalcinosis in the symphysis pubis.  Chondrocalcinosis at the insertion of the hamstring tendon on the ischium.  Moderate joint space narrowing in the left hip.  Soft tissue window images demonstrate extensive sigmoid colon diverticulosis and mild iliofemoral atherosclerosis.  IMPRESSION:  1.  No acute fractures involving the left proximal femur or the left hemipelvis. 2. CPPD, with chondrocalcinosis involving the acetabular labrum, the symphysis pubis, and the hamstring tendon. 3.  Moderate osteoarthritis in the left hip.  The acetabular chondrocalcinosis might account for the patient's pain.   Original Report Authenticated By: Hulan Saas, M.D.    Dg Chest Port 1 View  01/20/2013  *RADIOLOGY REPORT*  Clinical Data: Loss of consciousness.  Hypertension.  PORTABLE CHEST - 1 VIEW  Comparison: 06/21/2012  Findings: Dual lead pacemaker is in place.  Artifact overlies chest.  Heart size is normal.  The vascularity is normal.  Lungs are  clear with mild scarring in the lingula.  No effusions.  No significant bony finding.  IMPRESSION: No active disease.  Dual lead pacemaker.  Lingular scarring.   Original Report Authenticated By: Paulina Fusi, M.D.     Microbiology: Recent Results (from the past 240 hour(s))  MRSA PCR SCREENING     Status: Abnormal   Collection Time    01/20/13 10:24 PM      Result Value Range Status   MRSA by PCR POSITIVE (*) NEGATIVE Final   Comment:            The GeneXpert MRSA Assay (FDA     approved for NASAL specimens     only), is one component of a     comprehensive MRSA colonization     surveillance program. It is not     intended to diagnose MRSA     infection nor to guide or     monitor treatment for     MRSA infections.     RESULT CALLED TO, READ BACK BY AND VERIFIED WITH:     M.CLAYTON RN AT 0511 ON 02.18.14 BY SHUEA.     Labs: Basic Metabolic Panel:  Recent Labs Lab 01/20/13 1656 01/20/13 2238 01/21/13 0425  NA 132*  --  138  K 5.2*  --  5.0  CL 97  --  103  CO2 24  --  22  GLUCOSE 146*  --  95  BUN 24*  --  23  CREATININE 0.99 0.92 0.93  CALCIUM 8.6  --  8.1*   Liver Function Tests: No results found for this basename: AST, ALT, ALKPHOS, BILITOT, PROT, ALBUMIN,  in the last 168 hours No results found for this basename: LIPASE, AMYLASE,  in the last 168 hours No results found for this basename: AMMONIA,  in the last 168 hours CBC:  Recent Labs Lab 01/20/13 1656 01/20/13 2238 01/21/13 0425  WBC 5.5 5.2 5.3  NEUTROABS 3.6  --   --   HGB 10.5* 10.1* 9.7*  HCT 31.3* 29.4* 29.1*  MCV 100.6* 99.0 101.4*  PLT 230 207 190   Cardiac Enzymes:  Recent Labs Lab 01/20/13 1656 01/20/13 2238 01/21/13 0425  TROPONINI <0.30 <0.30 <0.30   BNP: BNP (last 3 results) No results found for this basename: PROBNP,  in the last 8760 hours CBG:  Recent Labs Lab 01/20/13 2138 01/20/13 2336  GLUCAP 95 98    Time coordinating discharge: 45 minutes  Signed:  Vinisha Faxon,  Blaiden Werth  [facility] 01/21/2013, 11:44 AM

## 2013-01-21 NOTE — Progress Notes (Signed)
   CARE MANAGEMENT NOTE 01/21/2013  Patient:  [name],[name]   Account Number:  [id number]  Date Initiated:  01/21/2013  Documentation initiated by:  Jiles Crocker  Subjective/Objective Assessment:   ADMITTED WITH SYNCOPAL EPISODE     Action/Plan:   PATIENT RESIDES IN A NURSING FACILITY; SOC WORKER REFERRAL PLACED   Anticipated DC Date:  01/22/2013   Anticipated DC Plan:  SKILLED NURSING FACILITY  In-house referral  Clinical Social Worker      DC Planning Services  CM consult          Status of service:  In process, will continue to follow Medicare Important Message given?  NA - LOS <3 / Initial given by admissions (If response is "NO", the following Medicare IM given date fields will be blank) Per UR Regulation:  Reviewed for med. necessity/level of care/duration of stay Comments:  01/21/2013- [name] Jaeger Trueheart RN,BSN,MHA

## 2013-02-28 ENCOUNTER — Emergency Department (HOSPITAL_COMMUNITY): Payer: Medicare Other

## 2013-02-28 ENCOUNTER — Emergency Department (HOSPITAL_COMMUNITY)
Admission: EM | Admit: 2013-02-28 | Discharge: 2013-02-28 | Disposition: A | Discharge status: Home / Self Care | Payer: Medicare Other | Attending: Emergency Medicine | Admitting: Emergency Medicine

## 2013-02-28 ENCOUNTER — Encounter (HOSPITAL_COMMUNITY): Payer: Self-pay | Admitting: Emergency Medicine

## 2013-02-28 DIAGNOSIS — Z8673 Personal history of transient ischemic attack (TIA), and cerebral infarction without residual deficits: Secondary | ICD-10-CM | POA: Insufficient documentation

## 2013-02-28 DIAGNOSIS — Z8669 Personal history of other diseases of the nervous system and sense organs: Secondary | ICD-10-CM | POA: Insufficient documentation

## 2013-02-28 DIAGNOSIS — N390 Urinary tract infection, site not specified: Secondary | ICD-10-CM | POA: Insufficient documentation

## 2013-02-28 DIAGNOSIS — Z8679 Personal history of other diseases of the circulatory system: Secondary | ICD-10-CM | POA: Insufficient documentation

## 2013-02-28 DIAGNOSIS — Z79899 Other long term (current) drug therapy: Secondary | ICD-10-CM | POA: Insufficient documentation

## 2013-02-28 DIAGNOSIS — Z7982 Long term (current) use of aspirin: Secondary | ICD-10-CM | POA: Insufficient documentation

## 2013-02-28 DIAGNOSIS — E785 Hyperlipidemia, unspecified: Secondary | ICD-10-CM | POA: Insufficient documentation

## 2013-02-28 DIAGNOSIS — I2510 Atherosclerotic heart disease of native coronary artery without angina pectoris: Secondary | ICD-10-CM | POA: Insufficient documentation

## 2013-02-28 DIAGNOSIS — I10 Essential (primary) hypertension: Secondary | ICD-10-CM | POA: Insufficient documentation

## 2013-02-28 DIAGNOSIS — E119 Type 2 diabetes mellitus without complications: Secondary | ICD-10-CM | POA: Insufficient documentation

## 2013-02-28 DIAGNOSIS — K219 Gastro-esophageal reflux disease without esophagitis: Secondary | ICD-10-CM | POA: Insufficient documentation

## 2013-02-28 HISTORY — DX: Disorientation, unspecified: R41.0

## 2013-02-28 LAB — URINE MICROSCOPIC-ADD ON

## 2013-02-28 LAB — POCT I-STAT, CHEM 8
Creatinine, Ser: 1 mg/dL (ref 0.50–1.10)
HCT: 27 % — ABNORMAL LOW (ref 36.0–46.0)
Hemoglobin: 9.2 g/dL — ABNORMAL LOW (ref 12.0–15.0)
Potassium: 5.3 mEq/L — ABNORMAL HIGH (ref 3.5–5.1)
Sodium: 136 mEq/L (ref 135–145)
TCO2: 26 mmol/L (ref 0–100)

## 2013-02-28 LAB — CBC
HCT: 27.7 % — ABNORMAL LOW (ref 36.0–46.0)
MCHC: 33.6 g/dL (ref 30.0–36.0)
Platelets: 245 10*3/uL (ref 150–400)
RDW: 12.1 % (ref 11.5–15.5)
WBC: 7 10*3/uL (ref 4.0–10.5)

## 2013-02-28 LAB — URINALYSIS, ROUTINE W REFLEX MICROSCOPIC
Hgb urine dipstick: NEGATIVE
Nitrite: POSITIVE — AB
Protein, ur: NEGATIVE mg/dL
Specific Gravity, Urine: 1.015 (ref 1.005–1.030)
Urobilinogen, UA: 0.2 mg/dL (ref 0.0–1.0)

## 2013-02-28 LAB — GLUCOSE, CAPILLARY: Glucose-Capillary: 110 mg/dL — ABNORMAL HIGH (ref 70–99)

## 2013-02-28 MED ORDER — CIPROFLOXACIN HCL 500 MG PO TABS: 250.0000 mg | ORAL_TABLET | Freq: Two times a day (BID) | ORAL | Status: DC

## 2013-02-28 MED ORDER — DEXTROSE 5 % IV SOLN
1.0000 g | Freq: Two times a day (BID) | INTRAVENOUS | Status: DC
  Administered 2013-02-28: 1 g via INTRAVENOUS
  Filled 2013-02-28: qty 1

## 2013-02-28 NOTE — ED Provider Notes (Signed)
4:15 PM Patient signed out to me by Glade Nurse, PA-C. Patient pending urinalysis and labs for her headache.   5:30 PM Labs unremarkable. Urinalysis pending.   6:06 PM Urinalysis shows UTI. Patient will be admitted for AMS secondary to UTI. Patient will be started on Cefepime here. Patient reports relief of her headache. She is alert and oriented x2.  7:43 PM I spoke with Hospitalist Dr. Ardyth Harps about admission which she does not think is appropriate. She recommended discharge the patient back to nursing facility with PO 250 mg Cipro BID for 7 days. I will discharge the patient and instruct the nursing facility to bring the patient back if she has increased confusion or has poor mentation. Vitals stable at this time.   Emilia Beck, PA-C 03/01/13 0105

## 2013-02-28 NOTE — ED Notes (Signed)
CBG 110. 

## 2013-02-28 NOTE — ED Provider Notes (Signed)
History     CSN: [id number]  Arrival date & time 02/28/13  1400   First MD Initiated Contact with Patient 02/28/13 1411      Chief Complaint  Patient presents with  . Headache    (Consider location/radiation/quality/duration/timing/severity/associated sxs/prior treatment) HPI Comments: 79 year old female history of CVA, diabetes, hypertension, coronary disease and seizure disorder with a history of a left bundle branch block and pacemaker placement in the past who presents with no complaints.   Pt states she was complaining of a headache at her assisted living place, but they gave her Tylenol and now she feels better. She presents alert, oriented x1, confused at times. Pt denies fall, dizziness, n/v/c/d, chest pain, SOB, visual changes. Pt states she ambulates well. Pt states her daughter (who lives out of state) worries about her and wanted her checked out.   Patient is a 79 y.o. female presenting with headaches.  Headache Associated symptoms: no diarrhea, no dizziness, no fever, no nausea, no neck pain, no neck stiffness, no numbness and no vomiting     Past Medical History  Diagnosis Date  . Diabetes mellitus   . Hypertension   . Acid reflux   . Seizures   . Coronary artery disease 2003    60-75%D2, 50% D1, 25% left circ, 20% LAD, normal LVF  . Dyslipidemia   . Stroke   . LBBB (left bundle branch block)     Past Surgical History  Procedure Laterality Date  . Cholecystectomy    . Abdominal hysterectomy    . Appendectomy    . Iridectomy    . Mastoidectomy    . Eye surgery      cataracts/ iridectomy    No family history on file.  History  Substance Use Topics  . Smoking status: Never Smoker   . Smokeless tobacco: Never Used  . Alcohol Use: No    OB History   Grav Para Term Preterm Abortions TAB SAB Ect Mult Living                  Review of Systems  Constitutional: Negative for fever and diaphoresis.  HENT: Negative for neck pain and neck stiffness.    Eyes: Negative for visual disturbance.  Respiratory: Negative for apnea, chest tightness and shortness of breath.   Cardiovascular: Negative for chest pain and palpitations.  Gastrointestinal: Negative for nausea, vomiting, diarrhea and constipation.  Genitourinary: Negative for dysuria.  Musculoskeletal: Negative for gait problem.  Skin: Negative for rash.  Neurological: Negative for dizziness, weakness, light-headedness, numbness and headaches.    Allergies  Review of patient's allergies indicates no known allergies.  Home Medications   Current Outpatient Rx  Name  Route  Sig  Dispense  Refill  . Alcaftadine (LASTACAFT) 0.25 % SOLN   Both Eyes   Place 1 drop into both eyes daily.          Marland Kitchen ALPRAZolam (XANAX) 0.25 MG tablet   Oral   Take 0.25 mg by mouth every 6 (six) hours as needed for anxiety.          Marland Kitchen aspirin EC 325 MG tablet   Oral   Take 325 mg by mouth daily.         . carbamazepine (TEGRETOL) 200 MG tablet   Oral   Take 400 mg by mouth 2 (two) times daily.         . cholecalciferol (VITAMIN D) 1000 UNITS tablet   Oral   Take 1,000 Units by mouth  2 (two) times daily.         . clopidogrel (PLAVIX) 75 MG tablet   Oral   Take 75 mg by mouth daily.         . divalproex (DEPAKOTE) 125 MG DR tablet   Oral   Take 125 mg by mouth 2 (two) times daily.         . ferrous sulfate 325 (65 FE) MG tablet   Oral   Take 325 mg by mouth daily with breakfast.         . gentamicin (GARAMYCIN) 0.3 % ophthalmic solution   Both Eyes   Place 1 drop into both eyes 4 (four) times daily as needed. For bacterial infection Use for course of 1 week when condition occurs         . glyBURIDE (DIABETA) 2.5 MG tablet   Oral   Take 2.5 mg by mouth daily with breakfast.         . isosorbide mononitrate (IMDUR) 30 MG 24 hr tablet   Oral   Take 60 mg by mouth daily.         . metoprolol succinate (TOPROL XL) 25 MG 24 hr tablet   Oral   Take 2 tablets (50  mg total) by mouth daily.   30 tablet   11   . mometasone (ELOCON) 0.1 % ointment   Topical   Apply 1 application topically daily. Apply around and to skin of eyelids for 1 week courses when inflammation occurs, do not get this medication in the eyes         . nitroGLYCERIN (NITROSTAT) 0.4 MG SL tablet   Sublingual   Place 0.4 mg under the tongue every 5 (five) minutes x 3 doses as needed. For chest pain         . omeprazole (PRILOSEC) 20 MG capsule   Oral   Take 20 mg by mouth daily before breakfast. Take on empty stomach at 630 am         . senna-docusate (SENOKOT-S) 8.6-50 MG per tablet   Oral   Take 1 tablet by mouth 2 (two) times daily.         . simvastatin (ZOCOR) 20 MG tablet   Oral   Take 20 mg by mouth at bedtime.         Marland Kitchen telmisartan (MICARDIS) 80 MG tablet   Oral   Take 80 mg by mouth daily.         . traMADol (ULTRAM) 50 MG tablet   Oral   Take 50 mg by mouth every 6 (six) hours as needed for pain.           BP 167/71  Pulse 59  Temp(Src) 99 F (37.2 C) (Oral)  Resp 16  SpO2 98%  Physical Exam  Nursing note and vitals reviewed. Constitutional: She appears well-developed and well-nourished. No distress.  HENT:  Head: Normocephalic and atraumatic.  Eyes: Conjunctivae and EOM are normal.  Neck: Normal range of motion. Neck supple.  No meningeal signs  Cardiovascular: Normal rate, regular rhythm and normal heart sounds.  Exam reveals no gallop and no friction rub.   No murmur heard. Pulmonary/Chest: Effort normal and breath sounds normal. No respiratory distress. She has no wheezes. She has no rales. She exhibits no tenderness.  Abdominal: Soft. Bowel sounds are normal. She exhibits no distension. There is no tenderness. There is no rebound and no guarding.  Musculoskeletal: Normal range of motion. She exhibits no edema  and no tenderness.  No step-offs noted on C-spine Full range of motion of the T-spine and L-spine No tenderness to  palpation of the spinous processes of the  C-spine, T-spine or L-spine Mild tenderness to palpation of the paraspinous muscls  Neurological: She is alert. No cranial nerve deficit.  Speech is clear, follows commands No focal deficits, sensation to light touch intact.   Skin: Skin is warm and dry. She is not diaphoretic. No erythema.    ED Course  Procedures (including critical care time)   Ct Head Wo Contrast  02/28/2013  *RADIOLOGY REPORT*  Clinical Data: Complains of headache.  Elevated blood pressure.  CT HEAD WITHOUT CONTRAST  Technique:  Contiguous axial images were obtained from the base of the skull through the vertex without contrast.  Comparison: 01/20/2013  Findings:  Right parietal lobe, multifocal areas of encephalomalacia are noted in both cerebral hemispheres.  This includes the right parietal lobe, left occipital lobe and left posterior temporal lobe. There is diffuse patchy low density throughout the subcortical and periventricular white matter consistent with chronic small vessel ischemic change.  There is prominence of the sulci and ventricles consistent with brain atrophy.  Postsurgical changes from right mastoidectomy noted.  The left mastoid air cells appear normal.  Moderate mucosal thickening is noted within the right maxillary sinus.  There is also mucosal thickening within the frontal sinuses and the ethmoid air cells.  IMPRESSION:  1.  No acute findings. 2.  Atrophy and small vessel ischemic disease. 3.  Similar appearance of bilateral encephalomalacia. 4.  Sinus opacification.   Original Report Authenticated By: Signa Kell, M.D.      No diagnosis found.    MDM  PE and ROS were benign. Due to pt hx and altered mental status of pt, will order CT, CGB, UA, to r/o stroke, hypoglycemia, UTI. Also will get basic labs to check for infection, HCT/HGB, and electrolytes. Pt is in NAD. States no pain at this time.   At time of transfer of care, CT showed no acute findings.  Pending negative lab work, pt can be returned to [facility]. Discussed findings to date with Emilia Carmela Piechowski, PA-C.  Glade Nurse, PA-C 02/28/13 1615

## 2013-02-28 NOTE — ED Notes (Signed)
When entered room to take patient to CT, pt was standing at foot of bed and stated she had been waiting all day to go back to the home and that nothing was wrong with her.  Talked with patient and she agreed to get back in the bed and go for CT scan.

## 2013-02-28 NOTE — ED Notes (Signed)
Per EMS pt c/o from [facility]. EMS was called out for HA. Upon EMS arrival pt didn't have a HA anymore, staff wanted her to come out to get evaluated. Intially BP 210/98, last BP 152/52 manually. Pt took tylenol PTA and reports her HA went away. HR 60 RR 18.

## 2013-02-28 NOTE — ED Notes (Signed)
Walked patient to bathroom to obtain urine sample. Pt unable to void at this time.

## 2013-03-01 NOTE — ED Provider Notes (Signed)
Medical screening examination/treatment/procedure(s) were performed by non-physician practitioner and as supervising physician I was immediately available for consultation/collaboration.   Charles B. Bernette Mayers, MD 03/01/13 1019

## 2013-03-01 NOTE — ED Provider Notes (Signed)
Medical screening examination/treatment/procedure(s) were performed by non-physician practitioner and as supervising physician I was immediately available for consultation/collaboration.   Derwood Kaplan, MD 03/01/13 [phone]

## 2013-03-03 LAB — URINE CULTURE

## 2013-03-04 ENCOUNTER — Telehealth (HOSPITAL_COMMUNITY): Payer: Self-pay | Admitting: Emergency Medicine

## 2013-03-04 NOTE — ED Notes (Signed)
Results received from [facility]. (+) [facility] -> Rx given in ED for Cipro -> sensitive to the same.  Chart appended per protocol. 

## 2013-03-29 ENCOUNTER — Inpatient Hospital Stay (HOSPITAL_COMMUNITY)
Admission: EM | Admit: 2013-03-29 | Discharge: 2013-04-07 | DRG: 689 | Disposition: A | Discharge status: Skilled Nursing Facility | Payer: Medicare Other | Attending: Internal Medicine | Admitting: Internal Medicine

## 2013-03-29 ENCOUNTER — Encounter (HOSPITAL_COMMUNITY): Payer: Self-pay | Admitting: Emergency Medicine

## 2013-03-29 DIAGNOSIS — I2510 Atherosclerotic heart disease of native coronary artery without angina pectoris: Secondary | ICD-10-CM | POA: Diagnosis present

## 2013-03-29 DIAGNOSIS — H10409 Unspecified chronic conjunctivitis, unspecified eye: Secondary | ICD-10-CM | POA: Diagnosis present

## 2013-03-29 DIAGNOSIS — Z950 Presence of cardiac pacemaker: Secondary | ICD-10-CM

## 2013-03-29 DIAGNOSIS — R55 Syncope and collapse: Secondary | ICD-10-CM

## 2013-03-29 DIAGNOSIS — Z79899 Other long term (current) drug therapy: Secondary | ICD-10-CM

## 2013-03-29 DIAGNOSIS — R7881 Bacteremia: Secondary | ICD-10-CM | POA: Clinically undetermined

## 2013-03-29 DIAGNOSIS — G40909 Epilepsy, unspecified, not intractable, without status epilepticus: Secondary | ICD-10-CM | POA: Diagnosis present

## 2013-03-29 DIAGNOSIS — W19XXXA Unspecified fall, initial encounter: Secondary | ICD-10-CM

## 2013-03-29 DIAGNOSIS — E785 Hyperlipidemia, unspecified: Secondary | ICD-10-CM | POA: Diagnosis present

## 2013-03-29 DIAGNOSIS — I10 Essential (primary) hypertension: Secondary | ICD-10-CM | POA: Diagnosis present

## 2013-03-29 DIAGNOSIS — R4182 Altered mental status, unspecified: Secondary | ICD-10-CM | POA: Diagnosis present

## 2013-03-29 DIAGNOSIS — A498 Other bacterial infections of unspecified site: Secondary | ICD-10-CM | POA: Diagnosis present

## 2013-03-29 DIAGNOSIS — Z7982 Long term (current) use of aspirin: Secondary | ICD-10-CM

## 2013-03-29 DIAGNOSIS — Z7902 Long term (current) use of antithrombotics/antiplatelets: Secondary | ICD-10-CM

## 2013-03-29 DIAGNOSIS — F0150 Vascular dementia without behavioral disturbance: Secondary | ICD-10-CM | POA: Diagnosis present

## 2013-03-29 DIAGNOSIS — G9341 Metabolic encephalopathy: Secondary | ICD-10-CM | POA: Diagnosis present

## 2013-03-29 DIAGNOSIS — M25551 Pain in right hip: Secondary | ICD-10-CM | POA: Diagnosis present

## 2013-03-29 DIAGNOSIS — K219 Gastro-esophageal reflux disease without esophagitis: Secondary | ICD-10-CM | POA: Diagnosis present

## 2013-03-29 DIAGNOSIS — E119 Type 2 diabetes mellitus without complications: Secondary | ICD-10-CM | POA: Diagnosis present

## 2013-03-29 DIAGNOSIS — E860 Dehydration: Secondary | ICD-10-CM | POA: Diagnosis present

## 2013-03-29 DIAGNOSIS — E871 Hypo-osmolality and hyponatremia: Secondary | ICD-10-CM | POA: Diagnosis present

## 2013-03-29 DIAGNOSIS — Z8673 Personal history of transient ischemic attack (TIA), and cerebral infarction without residual deficits: Secondary | ICD-10-CM

## 2013-03-29 DIAGNOSIS — R945 Abnormal results of liver function studies: Secondary | ICD-10-CM

## 2013-03-29 DIAGNOSIS — H109 Unspecified conjunctivitis: Secondary | ICD-10-CM | POA: Diagnosis present

## 2013-03-29 DIAGNOSIS — I672 Cerebral atherosclerosis: Secondary | ICD-10-CM | POA: Diagnosis present

## 2013-03-29 DIAGNOSIS — F05 Delirium due to known physiological condition: Secondary | ICD-10-CM | POA: Diagnosis present

## 2013-03-29 DIAGNOSIS — M25559 Pain in unspecified hip: Secondary | ICD-10-CM | POA: Diagnosis present

## 2013-03-29 DIAGNOSIS — N390 Urinary tract infection, site not specified: Principal | ICD-10-CM | POA: Diagnosis present

## 2013-03-29 HISTORY — DX: Presence of cardiac pacemaker: Z95.0

## 2013-03-29 HISTORY — DX: Unspecified fall, initial encounter: W19.XXXA

## 2013-03-29 HISTORY — DX: Anxiety disorder, unspecified: F41.9

## 2013-03-29 HISTORY — DX: Type 2 diabetes mellitus without complications: E11.9

## 2013-03-29 HISTORY — DX: Acute myocardial infarction, unspecified: I21.9

## 2013-03-29 HISTORY — DX: Unspecified place in nursing home as the place of occurrence of the external cause: Y92.129

## 2013-03-29 HISTORY — DX: Shortness of breath: R06.02

## 2013-03-29 LAB — CBC
HCT: 31.6 % — ABNORMAL LOW (ref 36.0–46.0)
Hemoglobin: 11.2 g/dL — ABNORMAL LOW (ref 12.0–15.0)
MCV: 94.3 fL (ref 78.0–100.0)
Platelets: 182 10*3/uL (ref 150–400)
RBC: 3.35 MIL/uL — ABNORMAL LOW (ref 3.87–5.11)
WBC: 5.8 10*3/uL (ref 4.0–10.5)

## 2013-03-29 LAB — COMPREHENSIVE METABOLIC PANEL
AST: 313 U/L — ABNORMAL HIGH (ref 0–37)
CO2: 21 mEq/L (ref 19–32)
Chloride: 91 mEq/L — ABNORMAL LOW (ref 96–112)
Creatinine, Ser: 1.03 mg/dL (ref 0.50–1.10)
GFR calc non Af Amer: 49 mL/min — ABNORMAL LOW (ref >90)
Total Bilirubin: 1.1 mg/dL (ref 0.3–1.2)

## 2013-03-29 MED ORDER — SODIUM CHLORIDE 0.9 % IV SOLN
Freq: Once | INTRAVENOUS | Status: AC
  Administered 2013-03-29: via INTRAVENOUS

## 2013-03-29 NOTE — ED Provider Notes (Signed)
History     CSN: [id number]  Arrival date & time 03/29/13  2121   First MD Initiated Contact with Patient 03/29/13 2315      Chief Complaint  Patient presents with  . Loss of Consciousness   level V caveat for altered mental status and dementia HPI Elsie Baynes is a 79 y.o. female arrives via EMS for loss of consciousness and fall according to her nursing home.  No other information was furnished. Patient has no complaints at this time, she denies any neck pain, chest pain, does not feel dizzy, has no abdominal pain, nausea vomiting or diarrhea. She denies any fevers.   Past Medical History  Diagnosis Date  . Diabetes mellitus   . Hypertension   . Acid reflux   . Seizures   . Coronary artery disease 2003    60-75%D2, 50% D1, 25% left circ, 20% LAD, normal LVF  . Dyslipidemia   . Stroke   . LBBB (left bundle branch block)   . Confusion     Past Surgical History  Procedure Laterality Date  . Cholecystectomy    . Abdominal hysterectomy    . Appendectomy    . Iridectomy    . Mastoidectomy    . Eye surgery      cataracts/ iridectomy    No family history on file.  History  Substance Use Topics  . Smoking status: Never Smoker   . Smokeless tobacco: Never Used  . Alcohol Use: No    OB History   Grav Para Term Preterm Abortions TAB SAB Ect Mult Living                  Review of Systems Level 5 caveat for altered mental status/dementia Allergies  Review of patient's allergies indicates no known allergies.  Home Medications   Current Outpatient Rx  Name  Route  Sig  Dispense  Refill  . Alcaftadine (LASTACAFT) 0.25 % SOLN   Both Eyes   Place 1 drop into both eyes daily.          Marland Kitchen aspirin EC 325 MG tablet   Oral   Take 325 mg by mouth daily.         . carbamazepine (TEGRETOL) 200 MG tablet   Oral   Take 400 mg by mouth 2 (two) times daily.         . Carboxymethylcellulose Sodium (REFRESH TEARS OP)   Both Eyes   Place 1 drop into both eyes as  needed. For dry/irritated eyes         . cholecalciferol (VITAMIN D) 1000 UNITS tablet   Oral   Take 1,000 Units by mouth 2 (two) times daily.         . clopidogrel (PLAVIX) 75 MG tablet   Oral   Take 75 mg by mouth daily.         . divalproex (DEPAKOTE) 125 MG DR tablet   Oral   Take 125 mg by mouth 2 (two) times daily.         . ferrous sulfate 325 (65 FE) MG tablet   Oral   Take 325 mg by mouth daily with breakfast.         . gentamicin (GARAMYCIN) 0.3 % ophthalmic solution   Both Eyes   Place 1 drop into both eyes 4 (four) times daily as needed. For bacterial infection Use for course of 1 week when condition occurs         . glyBURIDE (DIABETA)  2.5 MG tablet   Oral   Take 2.5 mg by mouth every morning.          . isosorbide mononitrate (IMDUR) 30 MG 24 hr tablet   Oral   Take 60 mg by mouth daily.         Marland Kitchen LORazepam (ATIVAN) 0.5 MG tablet   Oral   Take 0.25 mg by mouth See admin instructions. Take 0.25mg  TWICE DAILY. May take every 4 hours IF NEEDED for agitation /anxiety. Do not give within 4 hours of scheduled Lorazepam.         . metoprolol succinate (TOPROL XL) 25 MG 24 hr tablet   Oral   Take 2 tablets (50 mg total) by mouth daily.   30 tablet   11   . mometasone (ELOCON) 0.1 % ointment   Topical   Apply 1 application topically daily. Apply around and to skin of eyelids for 1 week courses when inflammation occurs, do not get this medication in the eyes         . nitroGLYCERIN (NITROSTAT) 0.4 MG SL tablet   Sublingual   Place 0.4 mg under the tongue every 5 (five) minutes x 3 doses as needed. For chest pain         . omeprazole (PRILOSEC) 20 MG capsule   Oral   Take 20 mg by mouth daily before breakfast. Take on empty stomach at 630 am         . senna-docusate (SENOKOT-S) 8.6-50 MG per tablet   Oral   Take 1 tablet by mouth 2 (two) times daily.         . simvastatin (ZOCOR) 20 MG tablet   Oral   Take 20 mg by mouth at  bedtime.         Marland Kitchen telmisartan (MICARDIS) 80 MG tablet   Oral   Take 80 mg by mouth daily.         . traMADol (ULTRAM) 50 MG tablet   Oral   Take 50 mg by mouth every 6 (six) hours as needed for pain.           BP 131/114  Pulse 60  Temp(Src) 98.7 F (37.1 C) (Oral)  Resp 18  SpO2 98%  Physical Exam  Nursing notes reviewed.  Electronic medical record reviewed. VITAL SIGNS:   Filed Vitals:   03/30/13 0730 03/30/13 0745 03/30/13 0800 03/30/13 0915  BP:    158/71  Pulse: 62 59 67 64  Temp:    98.9 F (37.2 C)  TempSrc:    Oral  Resp: 15 18 20 18   Height:    5\' 5"  (1.651 m)  Weight:    220 lb 7.4 oz (100 kg)  SpO2: 100% 100% 100% 99%   CONSTITUTIONAL: Awake, alert, pleasantly confused does not know she is, appears non-toxic HENT: Atraumatic, normocephalic, oral mucosa pink and moist, airway patent. Nares patent without drainage. External ears normal. EYES: Conjunctiva clear, eyes are crusted shut, EOMI, PERRLA NECK: Trachea midline, non-tender, supple CARDIOVASCULAR: Normal heart rate, Normal rhythm, No murmurs, rubs, gallops PULMONARY/CHEST: Clear to auscultation, no rhonchi, wheezes, or rales. Symmetrical breath sounds. Non-tender. Pacemaker in place and is nontender no erythema ABDOMINAL: Non-distended, soft, non-tender - no rebound or guarding.  BS normal. NEUROLOGIC: Non-focal, moving all four extremities, no gross sensory or motor deficits. EXTREMITIES: No clubbing, cyanosis, or edema SKIN: Warm, Dry, No erythema, No rash  ED Course  Procedures (including critical care time)  Date: 03/30/2013  Rate:  60  Rhythm: atrial paced rhythm  QRS Axis: normal  Intervals: normal  ST/T Wave abnormalities: normal  Conduction Disutrbances: none  Narrative Interpretation: no significant change compared with prior EKG dated 01/20/23  Labs Reviewed  CBC - Abnormal; Notable for the following:    RBC 3.35 (*)    Hemoglobin 11.2 (*)    HCT 31.6 (*)    All other  components within normal limits  COMPREHENSIVE METABOLIC PANEL - Abnormal; Notable for the following:    Sodium 126 (*)    Chloride 91 (*)    Glucose, Bld 139 (*)    BUN 25 (*)    Albumin 3.4 (*)    AST 313 (*)    ALT 130 (*)    Alkaline Phosphatase 151 (*)    GFR calc non Af Amer 49 (*)    GFR calc Af Amer 57 (*)    All other components within normal limits  GLUCOSE, CAPILLARY - Abnormal; Notable for the following:    Glucose-Capillary 136 (*)    All other components within normal limits  URINALYSIS, ROUTINE W REFLEX MICROSCOPIC  BILIRUBIN, DIRECT  POCT I-STAT TROPONIN I  POCT CBG (FASTING - GLUCOSE)-MANUAL ENTRY   Ct Head Wo Contrast  03/30/2013  *RADIOLOGY REPORT*  Clinical Data: Loss of consciousness after fall.  CT HEAD WITHOUT CONTRAST  Technique:  Contiguous axial images were obtained from the base of the skull through the vertex without contrast.  Comparison: 02/28/2013  Findings: Diffuse cerebral atrophy.  Ventricular dilatation consistent with central atrophy.  Focal areas of encephalomalacia in the right parietal lobe and left occipital lobe consistent with old infarcts.  There is associated ventricular dilatation due to negative mass effect.  These changes are stable since the previous study.  Old lacunar infarcts in the cerebellar hemispheres.  No mass effect or midline shift.  No abnormal extra-axial fluid collections.  Gray-white matter junctions are distinct.  Basal cisterns are not effaced.  No evidence of acute intracranial hemorrhage.  No depressed skull fractures.  Mucosal membrane thickening in the maxillary antra.  No acute air-fluid levels are demonstrated.  Vascular calcifications.  Postoperative changes in the right mastoid air cells with air-fluid levels demonstrated in the right mastoid region.  IMPRESSION: No acute intracranial abnormalities.  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  Postoperative changes in the right mastoid  region with air-fluid level present.   Original Report Authenticated By: Burman Nieves, M.D.    Ct Abdomen Pelvis W Contrast  03/30/2013  *RADIOLOGY REPORT*  Clinical Data: Loss of consciousness.  Elevated liver function studies.  Fall.  CT ABDOMEN AND PELVIS WITH CONTRAST  Technique:  Multidetector CT imaging of the abdomen and pelvis was performed following the standard protocol during bolus administration of intravenous contrast.  Contrast: OMNIPAQUE IOHEXOL 300 MG/ML  SOLN  Comparison: 08/09/2008  Findings: Technically limited study due to motion artifact. Atelectasis or fibrosis in the lung bases.  Surgical absence of the gallbladder.  Pneumobilia is likely postoperative.  This was present previously.  No focal liver lesions are appreciated.  Diffuse fatty infiltration of the pancreas.  Spleen, adrenal glands, kidneys, inferior vena cava, and retroperitoneal lymph nodes are unremarkable.  Calcification of the aorta without aneurysm.  The stomach and small bowel are decompressed.  Stool filled colon without distension.  No free air or free fluid in the abdomen.  Pelvis:  Bladder wall is not thickened.  Uterus is atrophic or surgically absent.  Diverticula in  the sigmoid colon without diverticulitis.  The appendix is not identified.  No abnormal adnexal masses.  No free or loculated pelvic fluid collections.  No significant pelvic lymphadenopathy.  Degenerative changes throughout the lumbar spine.  Diffuse demineralization.  No vertebral compression fractures.  Normal alignment of the lumbar vertebrae.  Pelvis, sacrum, and hips appear intact.  IMPRESSION: No acute abnormalities demonstrated in the abdomen or pelvis. Surgical absence of the gallbladder with pneumobilia, likely postoperative.  No significant changes since previous study.   Original Report Authenticated By: Burman Nieves, M.D.      1. Syncope   2. Fall, initial encounter   3. Abnormal LFTs   4. Altered mental status   5. CAD  (coronary artery disease)   6. Right hip pain       MDM  Patient arrives with very little history besides a syncopal episode, patient appears nontoxic at this point, she does have a pacemaker in place, EKG shows an atrial paced ventricular rhythm at a rate of 60.  We'll have the pacemaker interrogated and check for other sources of syncope and altered mental status.  Patient is hyponatremic with a sodium of 126, put the patient on a rate of normal saline, chills appears mildly dehydrated, her urine is not completely consistent with urinary tract infection however with concerns for altered mental status and limited history will give her a dose of antibiotics regardless.  LFTs are also abnormal with an AST of 313 and ALT of 1:30, alkaline phosphatase is 151. Patient has no tenderness in the right upper quadrant, I do not have a strong suspicion for an occlusive biliary disease, will obtain a CT of the abdomen and pelvis for further characterization.  Patient does have some pneumobilia, in discussion with the radiologist this could be expected status post cholecystectomy.  Interrogation of pacemaker shows no abnormalities.  Discussed with Dr. Alvester Morin - patient has called in orders placed, to be given a bed on telemetry and assessed by daytime triad hospitalist team.        Jones Skene, MD 03/30/13 [phone]

## 2013-03-29 NOTE — ED Notes (Signed)
Pt  Brought to ED by EMS with loss of consciousness .Pt doesn't remember the event.

## 2013-03-30 ENCOUNTER — Inpatient Hospital Stay (HOSPITAL_COMMUNITY): Payer: Medicare Other

## 2013-03-30 ENCOUNTER — Emergency Department (HOSPITAL_COMMUNITY): Payer: Medicare Other

## 2013-03-30 DIAGNOSIS — K219 Gastro-esophageal reflux disease without esophagitis: Secondary | ICD-10-CM | POA: Diagnosis present

## 2013-03-30 DIAGNOSIS — R4182 Altered mental status, unspecified: Secondary | ICD-10-CM

## 2013-03-30 DIAGNOSIS — M25551 Pain in right hip: Secondary | ICD-10-CM | POA: Diagnosis present

## 2013-03-30 DIAGNOSIS — R55 Syncope and collapse: Secondary | ICD-10-CM

## 2013-03-30 DIAGNOSIS — M25559 Pain in unspecified hip: Secondary | ICD-10-CM

## 2013-03-30 DIAGNOSIS — W19XXXA Unspecified fall, initial encounter: Secondary | ICD-10-CM

## 2013-03-30 DIAGNOSIS — I2510 Atherosclerotic heart disease of native coronary artery without angina pectoris: Secondary | ICD-10-CM | POA: Diagnosis present

## 2013-03-30 DIAGNOSIS — E860 Dehydration: Secondary | ICD-10-CM | POA: Diagnosis present

## 2013-03-30 DIAGNOSIS — E871 Hypo-osmolality and hyponatremia: Secondary | ICD-10-CM | POA: Diagnosis present

## 2013-03-30 DIAGNOSIS — I10 Essential (primary) hypertension: Secondary | ICD-10-CM | POA: Diagnosis present

## 2013-03-30 LAB — CORTISOL: Cortisol, Plasma: 23 ug/dL

## 2013-03-30 LAB — CBC
HCT: 28.4 % — ABNORMAL LOW (ref 36.0–46.0)
MCH: 33.8 pg (ref 26.0–34.0)
MCV: 94 fL (ref 78.0–100.0)
Platelets: 169 10*3/uL (ref 150–400)
RDW: 12.7 % (ref 11.5–15.5)
WBC: 13.2 10*3/uL — ABNORMAL HIGH (ref 4.0–10.5)

## 2013-03-30 LAB — URINALYSIS, ROUTINE W REFLEX MICROSCOPIC
Hgb urine dipstick: NEGATIVE
Protein, ur: NEGATIVE mg/dL
Urobilinogen, UA: 1 mg/dL (ref 0.0–1.0)

## 2013-03-30 LAB — POCT I-STAT TROPONIN I: Troponin i, poc: 0.02 ng/mL (ref 0.00–0.08)

## 2013-03-30 LAB — URINE MICROSCOPIC-ADD ON

## 2013-03-30 LAB — CK TOTAL AND CKMB (NOT AT ARMC)
CK, MB: 8.6 ng/mL (ref 0.3–4.0)
Relative Index: 1.8 (ref 0.0–2.5)
Total CK: 465 U/L — ABNORMAL HIGH (ref 7–177)

## 2013-03-30 LAB — VITAMIN B12: Vitamin B-12: 580 pg/mL (ref 211–911)

## 2013-03-30 LAB — RPR: RPR Ser Ql: NONREACTIVE

## 2013-03-30 LAB — OSMOLALITY: Osmolality: 278 mOsm/kg (ref 275–300)

## 2013-03-30 LAB — MRSA PCR SCREENING: MRSA by PCR: POSITIVE — AB

## 2013-03-30 LAB — GLUCOSE, CAPILLARY: Glucose-Capillary: 171 mg/dL — ABNORMAL HIGH (ref 70–99)

## 2013-03-30 LAB — CREATININE, SERUM: GFR calc Af Amer: 63 mL/min — ABNORMAL LOW (ref >90)

## 2013-03-30 LAB — HEMOGLOBIN A1C: Mean Plasma Glucose: 131 mg/dL — ABNORMAL HIGH (ref <117)

## 2013-03-30 MED ORDER — SODIUM CHLORIDE 0.9 % IJ SOLN: 3.0000 mL | INTRAMUSCULAR | Status: DC | PRN

## 2013-03-30 MED ORDER — IRBESARTAN 300 MG PO TABS
300.0000 mg | ORAL_TABLET | Freq: Every day | ORAL | Status: DC
  Administered 2013-03-30 – 2013-04-07 (×9): 300 mg via ORAL
  Filled 2013-03-30 (×9): qty 1

## 2013-03-30 MED ORDER — OLOPATADINE HCL 0.1 % OP SOLN
1.0000 [drp] | Freq: Every day | OPHTHALMIC | Status: DC
  Administered 2013-03-30 – 2013-04-07 (×9): 1 [drp] via OPHTHALMIC
  Filled 2013-03-30: qty 5

## 2013-03-30 MED ORDER — LORAZEPAM 0.5 MG PO TABS: 0.2500 mg | ORAL_TABLET | ORAL | Status: DC

## 2013-03-30 MED ORDER — ONDANSETRON HCL 8 MG PO TABS
4.0000 mg | ORAL_TABLET | Freq: Four times a day (QID) | ORAL | Status: DC | PRN
  Filled 2013-03-30: qty 0.5

## 2013-03-30 MED ORDER — NITROGLYCERIN 0.4 MG SL SUBL: 0.4000 mg | SUBLINGUAL_TABLET | SUBLINGUAL | Status: DC | PRN

## 2013-03-30 MED ORDER — METOPROLOL SUCCINATE ER 50 MG PO TB24
50.0000 mg | ORAL_TABLET | Freq: Every day | ORAL | Status: DC
  Administered 2013-03-30 – 2013-04-07 (×9): 50 mg via ORAL
  Filled 2013-03-30 (×9): qty 1

## 2013-03-30 MED ORDER — CARBOXYMETHYLCELLULOSE SODIUM 0.5 % OP SOLN: 1.0000 [drp] | Freq: Three times a day (TID) | OPHTHALMIC | Status: DC | PRN

## 2013-03-30 MED ORDER — ACETAMINOPHEN 650 MG RE SUPP: 650.0000 mg | Freq: Four times a day (QID) | RECTAL | Status: DC | PRN

## 2013-03-30 MED ORDER — POLYVINYL ALCOHOL 1.4 % OP SOLN
1.0000 [drp] | Freq: Three times a day (TID) | OPHTHALMIC | Status: DC | PRN
  Filled 2013-03-30: qty 15

## 2013-03-30 MED ORDER — ONDANSETRON HCL 4 MG/2ML IJ SOLN: 4.0000 mg | Freq: Three times a day (TID) | INTRAMUSCULAR | Status: DC | PRN

## 2013-03-30 MED ORDER — ISOSORBIDE MONONITRATE ER 60 MG PO TB24
60.0000 mg | ORAL_TABLET | Freq: Every day | ORAL | Status: DC
  Administered 2013-03-30 – 2013-04-07 (×9): 60 mg via ORAL
  Filled 2013-03-30 (×9): qty 1

## 2013-03-30 MED ORDER — ASPIRIN EC 325 MG PO TBEC
325.0000 mg | DELAYED_RELEASE_TABLET | Freq: Every day | ORAL | Status: DC
  Administered 2013-03-30 – 2013-04-02 (×4): 325 mg via ORAL
  Filled 2013-03-30 (×4): qty 1

## 2013-03-30 MED ORDER — DEXTROSE 5 % IV SOLN: 1.0000 g | Freq: Once | INTRAVENOUS | Status: DC

## 2013-03-30 MED ORDER — FERROUS SULFATE 325 (65 FE) MG PO TABS
325.0000 mg | ORAL_TABLET | Freq: Every day | ORAL | Status: DC
  Administered 2013-03-30 – 2013-04-07 (×9): 325 mg via ORAL
  Filled 2013-03-30 (×10): qty 1

## 2013-03-30 MED ORDER — CARBAMAZEPINE 200 MG PO TABS
400.0000 mg | ORAL_TABLET | Freq: Two times a day (BID) | ORAL | Status: DC
  Administered 2013-03-30 – 2013-04-07 (×16): 400 mg via ORAL
  Filled 2013-03-30 (×18): qty 2

## 2013-03-30 MED ORDER — PANTOPRAZOLE SODIUM 40 MG PO TBEC
40.0000 mg | DELAYED_RELEASE_TABLET | Freq: Every day | ORAL | Status: DC
  Administered 2013-03-30 – 2013-04-07 (×9): 40 mg via ORAL
  Filled 2013-03-30 (×6): qty 1

## 2013-03-30 MED ORDER — SODIUM CHLORIDE 0.9 % IJ SOLN
3.0000 mL | Freq: Two times a day (BID) | INTRAMUSCULAR | Status: DC
  Administered 2013-03-30 – 2013-04-07 (×5): 3 mL via INTRAVENOUS

## 2013-03-30 MED ORDER — ACETAMINOPHEN 325 MG PO TABS
650.0000 mg | ORAL_TABLET | Freq: Four times a day (QID) | ORAL | Status: DC | PRN
  Administered 2013-03-31 – 2013-04-01 (×3): 650 mg via ORAL
  Filled 2013-03-30 (×3): qty 2

## 2013-03-30 MED ORDER — ONDANSETRON HCL 4 MG/2ML IJ SOLN: 4.0000 mg | Freq: Four times a day (QID) | INTRAMUSCULAR | Status: DC | PRN

## 2013-03-30 MED ORDER — VITAMIN D3 25 MCG (1000 UNIT) PO TABS
1000.0000 [IU] | ORAL_TABLET | Freq: Two times a day (BID) | ORAL | Status: DC
  Administered 2013-03-30 – 2013-04-07 (×16): 1000 [IU] via ORAL
  Filled 2013-03-30 (×18): qty 1

## 2013-03-30 MED ORDER — IOHEXOL 300 MG/ML  SOLN
100.0000 mL | Freq: Once | INTRAMUSCULAR | Status: AC | PRN
  Administered 2013-03-30: 100 mL via INTRAVENOUS

## 2013-03-30 MED ORDER — MORPHINE SULFATE 2 MG/ML IJ SOLN: 2.0000 mg | INTRAMUSCULAR | Status: DC | PRN

## 2013-03-30 MED ORDER — TRIAMCINOLONE ACETONIDE 0.1 % EX OINT
TOPICAL_OINTMENT | Freq: Every day | CUTANEOUS | Status: DC
  Administered 2013-03-31: 10:00:00 via TOPICAL
  Filled 2013-03-30: qty 15

## 2013-03-30 MED ORDER — IOHEXOL 300 MG/ML  SOLN
20.0000 mL | INTRAMUSCULAR | Status: AC
  Administered 2013-03-30: 50 mL via ORAL

## 2013-03-30 MED ORDER — HEPARIN SODIUM (PORCINE) 5000 UNIT/ML IJ SOLN
5000.0000 [IU] | Freq: Three times a day (TID) | INTRAMUSCULAR | Status: DC
  Administered 2013-03-30 – 2013-04-03 (×11): 5000 [IU] via SUBCUTANEOUS
  Filled 2013-03-30 (×15): qty 1

## 2013-03-30 MED ORDER — DIVALPROEX SODIUM 125 MG PO DR TAB
125.0000 mg | DELAYED_RELEASE_TABLET | Freq: Two times a day (BID) | ORAL | Status: DC
  Administered 2013-03-30 – 2013-03-31 (×4): 125 mg via ORAL
  Filled 2013-03-30 (×6): qty 1

## 2013-03-30 MED ORDER — SODIUM CHLORIDE 0.9 % IV SOLN: 250.0000 mL | INTRAVENOUS | Status: DC | PRN

## 2013-03-30 MED ORDER — MOMETASONE FUROATE 0.1 % EX OINT: 1.0000 "application " | TOPICAL_OINTMENT | Freq: Every day | CUTANEOUS | Status: DC

## 2013-03-30 MED ORDER — CLOPIDOGREL BISULFATE 75 MG PO TABS
75.0000 mg | ORAL_TABLET | Freq: Every day | ORAL | Status: DC
  Administered 2013-03-30 – 2013-04-07 (×9): 75 mg via ORAL
  Filled 2013-03-30 (×8): qty 1

## 2013-03-30 MED ORDER — INSULIN ASPART 100 UNIT/ML ~~LOC~~ SOLN
0.0000 [IU] | Freq: Three times a day (TID) | SUBCUTANEOUS | Status: DC
  Administered 2013-03-30: 2 [IU] via SUBCUTANEOUS
  Administered 2013-03-31 – 2013-04-02 (×5): 1 [IU] via SUBCUTANEOUS
  Administered 2013-04-02: 2 [IU] via SUBCUTANEOUS
  Administered 2013-04-03 (×2): 1 [IU] via SUBCUTANEOUS
  Administered 2013-04-03 – 2013-04-04 (×2): 2 [IU] via SUBCUTANEOUS
  Administered 2013-04-04 (×2): 1 [IU] via SUBCUTANEOUS
  Administered 2013-04-05 – 2013-04-07 (×5): 2 [IU] via SUBCUTANEOUS
  Administered 2013-04-07: 1 [IU] via SUBCUTANEOUS

## 2013-03-30 MED ORDER — ALCAFTADINE 0.25 % OP SOLN: 1.0000 [drp] | Freq: Every day | OPHTHALMIC | Status: DC

## 2013-03-30 MED ORDER — SODIUM CHLORIDE 0.9 % IV SOLN
INTRAVENOUS | Status: DC
  Administered 2013-03-30 – 2013-04-07 (×9): via INTRAVENOUS

## 2013-03-30 MED ORDER — SENNOSIDES-DOCUSATE SODIUM 8.6-50 MG PO TABS
1.0000 | ORAL_TABLET | Freq: Two times a day (BID) | ORAL | Status: DC
  Administered 2013-03-30 – 2013-04-07 (×16): 1 via ORAL
  Filled 2013-03-30 (×18): qty 1

## 2013-03-30 MED ORDER — SODIUM CHLORIDE 0.9 % IJ SOLN
3.0000 mL | Freq: Two times a day (BID) | INTRAMUSCULAR | Status: DC
  Administered 2013-03-30 – 2013-04-07 (×12): 3 mL via INTRAVENOUS

## 2013-03-30 MED ORDER — SIMVASTATIN 20 MG PO TABS
20.0000 mg | ORAL_TABLET | Freq: Every day | ORAL | Status: DC
  Administered 2013-03-30 – 2013-04-06 (×7): 20 mg via ORAL
  Filled 2013-03-30 (×9): qty 1

## 2013-03-30 MED ORDER — TOBRAMYCIN 0.3 % OP SOLN
1.0000 [drp] | Freq: Four times a day (QID) | OPHTHALMIC | Status: DC
  Administered 2013-03-30 – 2013-03-31 (×4): 1 [drp] via OPHTHALMIC
  Filled 2013-03-30: qty 5

## 2013-03-30 NOTE — ED Notes (Signed)
Dr Brien Few at bedside.hospitalist

## 2013-03-30 NOTE — Progress Notes (Signed)
  Echocardiogram 2D Echocardiogram has been performed.  Georgian Co 03/30/2013, 5:33 PM

## 2013-03-30 NOTE — ED Notes (Signed)
Washed around pt eyes with washcloth and n/s.

## 2013-03-30 NOTE — H&P (Signed)
PCP:   Pearla Dubonnet, MD   Chief Complaint:  Fall, altered mental status.   HPI: This is an 79 year old female, with known history of DM-2, HTN, s/p CVA, seizure disorder, GERD, CAD, chronic LBBB, sinus node dysfunction, s/p PPM 06/20/12, dyslipidemia, query mild dementia, s/p TAH, s/p appendectomy, s/p Cholecystectomy, s/p previous right mastoid surgery, s/p eye surgery. Patient is s/p hospitalization 01/20/13-01/21/13, for syncopal episode. Work at that time, was negative. Patient is unable to contribute to history, and appears extremely forgetful. History is therefore gleaned from ED MD and accepting MD notes, and is very limited. As far as can be determined, patient fell at SNF. Not clear if she passed out.   Allergies:  No Known Allergies    Past Medical History  Diagnosis Date  . Diabetes mellitus   . Hypertension   . Acid reflux   . Seizures   . Coronary artery disease 2003    60-75%D2, 50% D1, 25% left circ, 20% LAD, normal LVF  . Dyslipidemia   . Stroke   . LBBB (left bundle branch block)   . Confusion     Past Surgical History  Procedure Laterality Date  . Cholecystectomy    . Abdominal hysterectomy    . Appendectomy    . Iridectomy    . Mastoidectomy    . Eye surgery      cataracts/ iridectomy    Prior to Admission medications   Medication Sig Start Date End Date Taking? Authorizing Provider  Alcaftadine (LASTACAFT) 0.25 % SOLN Place 1 drop into both eyes daily.    Yes Historical Provider, MD  aspirin EC 325 MG tablet Take 325 mg by mouth daily.   Yes Historical Provider, MD  carbamazepine (TEGRETOL) 200 MG tablet Take 400 mg by mouth 2 (two) times daily.   Yes Historical Provider, MD  Carboxymethylcellulose Sodium (REFRESH TEARS OP) Place 1 drop into both eyes as needed. For dry/irritated eyes   Yes Historical Provider, MD  cholecalciferol (VITAMIN D) 1000 UNITS tablet Take 1,000 Units by mouth 2 (two) times daily.   Yes Historical Provider, MD  clopidogrel  (PLAVIX) 75 MG tablet Take 75 mg by mouth daily.   Yes Historical Provider, MD  divalproex (DEPAKOTE) 125 MG DR tablet Take 125 mg by mouth 2 (two) times daily.   Yes Historical Provider, MD  ferrous sulfate 325 (65 FE) MG tablet Take 325 mg by mouth daily with breakfast.   Yes Historical Provider, MD  gentamicin (GARAMYCIN) 0.3 % ophthalmic solution Place 1 drop into both eyes 4 (four) times daily as needed. For bacterial infection Use for course of 1 week when condition occurs   Yes Historical Provider, MD  glyBURIDE (DIABETA) 2.5 MG tablet Take 2.5 mg by mouth every morning.    Yes Historical Provider, MD  isosorbide mononitrate (IMDUR) 30 MG 24 hr tablet Take 60 mg by mouth daily.   Yes Historical Provider, MD  LORazepam (ATIVAN) 0.5 MG tablet Take 0.25 mg by mouth See admin instructions. Take 0.25mg  TWICE DAILY. May take every 4 hours IF NEEDED for agitation /anxiety. Do not give within 4 hours of scheduled Lorazepam.   Yes Historical Provider, MD  metoprolol succinate (TOPROL XL) 25 MG 24 hr tablet Take 2 tablets (50 mg total) by mouth daily. 01/21/13 01/21/14 Yes Renae Fickle, MD  mometasone (ELOCON) 0.1 % ointment Apply 1 application topically daily. Apply around and to skin of eyelids for 1 week courses when inflammation occurs, do not get this medication  in the eyes   Yes Historical Provider, MD  nitroGLYCERIN (NITROSTAT) 0.4 MG SL tablet Place 0.4 mg under the tongue every 5 (five) minutes x 3 doses as needed. For chest pain   Yes Historical Provider, MD  omeprazole (PRILOSEC) 20 MG capsule Take 20 mg by mouth daily before breakfast. Take on empty stomach at 630 am   Yes Historical Provider, MD  senna-docusate (SENOKOT-S) 8.6-50 MG per tablet Take 1 tablet by mouth 2 (two) times daily.   Yes Historical Provider, MD  simvastatin (ZOCOR) 20 MG tablet Take 20 mg by mouth at bedtime.   Yes Historical Provider, MD  telmisartan (MICARDIS) 80 MG tablet Take 80 mg by mouth daily.   Yes Historical  Provider, MD  traMADol (ULTRAM) 50 MG tablet Take 50 mg by mouth every 6 (six) hours as needed for pain.   Yes Historical Provider, MD    Social History: Per EMR, patient has never smoked. She has never used smokeless tobacco. She reports that she does not drink alcohol or use illicit drugs.  Family History:  Unobtainable at this time, due to AMS. Marland Kitchen   Review of Systems:  As per HPI and chief complaint. Patent is unable to contribute, due to confusion and forgetfulness. She repeatedly states "I don't know what happened". Responds to each question with "I don't know". However, has no documented fever, vomiting, diarrhea, SOB or chest pain.   Physical Exam:  General:  Patient does not appear to be in obvious acute distress. Alert, communicative, disoriented, forgetful, follows simple commands, talking in complete sentences, not short of breath at rest.  HEENT:  Mild clinical pallor, no jaundice, no conjunctival injection or discharge. Right eye is shut, but no bruising is noted. Eyelids appear "sticky".  NECK:  Supple, JVP not seen, no carotid bruits, no palpable lymphadenopathy, no palpable goiter. CHEST:  Clinically clear to auscultation, no wheezes, no crackles. HEART:  Sounds 1 and 2 heard, normal, regular, no murmurs. ABDOMEN:  Moderately obese, soft, old surgical scars noted, non-tender, no palpable organomegaly, no palpable masses, normal bowel sounds. GENITALIA:  Not examined. LOWER EXTREMITIES:  No pitting edema, palpable peripheral pulses. MUSCULOSKELETAL SYSTEM:  Generalized osteoarthritic changes, otherwise, normal. Appears to have some pain on right hip movement.  CENTRAL NERVOUS SYSTEM:  No focal neurologic deficit on gross examination. Moving all limbs, has some difficulty raising right leg. Not clear whether this is secondary to pain.   Labs on Admission:  Results for orders placed during the hospital encounter of 03/29/13 (from the past 48 hour(s))  CBC     Status: Abnormal    Collection Time    03/29/13 10:26 PM      Result Value Range   WBC 5.8  4.0 - 10.5 K/uL   RBC 3.35 (*) 3.87 - 5.11 MIL/uL   Hemoglobin 11.2 (*) 12.0 - 15.0 g/dL   HCT 16.1 (*) 09.6 - 04.5 %   MCV 94.3  78.0 - 100.0 fL   MCH 33.4  26.0 - 34.0 pg   MCHC 35.4  30.0 - 36.0 g/dL   RDW 40.9  81.1 - 91.4 %   Platelets 182  150 - 400 K/uL  COMPREHENSIVE METABOLIC PANEL     Status: Abnormal   Collection Time    03/29/13 10:26 PM      Result Value Range   Sodium 126 (*) 135 - 145 mEq/L   Potassium 4.7  3.5 - 5.1 mEq/L   Chloride 91 (*) 96 - 112 mEq/L  CO2 21  19 - 32 mEq/L   Glucose, Bld 139 (*) 70 - 99 mg/dL   BUN 25 (*) 6 - 23 mg/dL   Creatinine, Ser 1.61  0.50 - 1.10 mg/dL   Calcium 8.8  8.4 - 09.6 mg/dL   Total Protein 7.6  6.0 - 8.3 g/dL   Albumin 3.4 (*) 3.5 - 5.2 g/dL   AST 045 (*) 0 - 37 U/L   ALT 130 (*) 0 - 35 U/L   Alkaline Phosphatase 151 (*) 39 - 117 U/L   Total Bilirubin 1.1  0.3 - 1.2 mg/dL   GFR calc non Af Amer 49 (*) >90 mL/min   GFR calc Af Amer 57 (*) >90 mL/min   Comment:            The eGFR has been calculated     using the CKD EPI equation.     This calculation has not been     validated in all clinical     situations.     eGFR's persistently     <90 mL/min signify     possible Chronic Kidney Disease.  GLUCOSE, CAPILLARY     Status: Abnormal   Collection Time    03/29/13 10:36 PM      Result Value Range   Glucose-Capillary 136 (*) 70 - 99 mg/dL  POCT I-STAT TROPONIN I     Status: None   Collection Time    03/29/13 10:40 PM      Result Value Range   Troponin i, poc 0.00  0.00 - 0.08 ng/mL   Comment 3            Comment: Due to the release kinetics of cTnI,     a negative result within the first hours     of the onset of symptoms does not rule out     myocardial infarction with certainty.     If myocardial infarction is still suspected,     repeat the test at appropriate intervals.  URINALYSIS, ROUTINE W REFLEX MICROSCOPIC     Status: Abnormal    Collection Time    03/29/13 11:59 PM      Result Value Range   Color, Urine AMBER (*) YELLOW   Comment: BIOCHEMICALS MAY BE AFFECTED BY COLOR   APPearance CLEAR  CLEAR   Specific Gravity, Urine 1.017  1.005 - 1.030   pH 5.5  5.0 - 8.0   Glucose, UA NEGATIVE  NEGATIVE mg/dL   Hgb urine dipstick NEGATIVE  NEGATIVE   Bilirubin Urine SMALL (*) NEGATIVE   Ketones, ur 15 (*) NEGATIVE mg/dL   Protein, ur NEGATIVE  NEGATIVE mg/dL   Urobilinogen, UA 1.0  0.0 - 1.0 mg/dL   Nitrite NEGATIVE  NEGATIVE   Leukocytes, UA SMALL (*) NEGATIVE  URINE MICROSCOPIC-ADD ON     Status: Abnormal   Collection Time    03/29/13 11:59 PM      Result Value Range   Squamous Epithelial / LPF FEW (*) RARE   WBC, UA 0-2  <3 WBC/hpf   RBC / HPF 0-2  <3 RBC/hpf   Bacteria, UA FEW (*) RARE   Casts HYALINE CASTS (*) NEGATIVE  BILIRUBIN, DIRECT     Status: Abnormal   Collection Time    03/30/13  2:19 AM      Result Value Range   Bilirubin, Direct 1.4 (*) 0.0 - 0.3 mg/dL    Radiological Exams on Admission: Ct Head Wo Contrast  03/30/2013  *RADIOLOGY REPORT*  Clinical Data: Loss of consciousness after fall.  CT HEAD WITHOUT CONTRAST  Technique:  Contiguous axial images were obtained from the base of the skull through the vertex without contrast.  Comparison: 02/28/2013  Findings: Diffuse cerebral atrophy.  Ventricular dilatation consistent with central atrophy.  Focal areas of encephalomalacia in the right parietal lobe and left occipital lobe consistent with old infarcts.  There is associated ventricular dilatation due to negative mass effect.  These changes are stable since the previous study.  Old lacunar infarcts in the cerebellar hemispheres.  No mass effect or midline shift.  No abnormal extra-axial fluid collections.  Gray-white matter junctions are distinct.  Basal cisterns are not effaced.  No evidence of acute intracranial hemorrhage.  No depressed skull fractures.  Mucosal membrane thickening in the maxillary  antra.  No acute air-fluid levels are demonstrated.  Vascular calcifications.  Postoperative changes in the right mastoid air cells with air-fluid levels demonstrated in the right mastoid region.  IMPRESSION: No acute intracranial abnormalities.  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  Postoperative changes in the right mastoid region with air-fluid level present.   Original Report Authenticated By: Burman Nieves, M.D.    Ct Abdomen Pelvis W Contrast  03/30/2013  *RADIOLOGY REPORT*  Clinical Data: Loss of consciousness.  Elevated liver function studies.  Fall.  CT ABDOMEN AND PELVIS WITH CONTRAST  Technique:  Multidetector CT imaging of the abdomen and pelvis was performed following the standard protocol during bolus administration of intravenous contrast.  Contrast: OMNIPAQUE IOHEXOL 300 MG/ML  SOLN  Comparison: 08/09/2008  Findings: Technically limited study due to motion artifact. Atelectasis or fibrosis in the lung bases.  Surgical absence of the gallbladder.  Pneumobilia is likely postoperative.  This was present previously.  No focal liver lesions are appreciated.  Diffuse fatty infiltration of the pancreas.  Spleen, adrenal glands, kidneys, inferior vena cava, and retroperitoneal lymph nodes are unremarkable.  Calcification of the aorta without aneurysm.  The stomach and small bowel are decompressed.  Stool filled colon without distension.  No free air or free fluid in the abdomen.  Pelvis:  Bladder wall is not thickened.  Uterus is atrophic or surgically absent.  Diverticula in the sigmoid colon without diverticulitis.  The appendix is not identified.  No abnormal adnexal masses.  No free or loculated pelvic fluid collections.  No significant pelvic lymphadenopathy.  Degenerative changes throughout the lumbar spine.  Diffuse demineralization.  No vertebral compression fractures.  Normal alignment of the lumbar vertebrae.  Pelvis, sacrum, and hips appear  intact.  IMPRESSION: No acute abnormalities demonstrated in the abdomen or pelvis. Surgical absence of the gallbladder with pneumobilia, likely postoperative.  No significant changes since previous study.   Original Report Authenticated By: Burman Nieves, M.D.     Assessment/Plan Active Problems:    1. Fall: Patient presented following a fall, and precise circumstances are unclear at this time. Differential include a mechanical fall, vs syncopal episode, vs seizure episode. She has no obvious evidence of acute trauma, and no tongue biting. Patient was worked up for syncope during her hospitalization in 01/2013, so, will not repeat carotid duplex, but will arrange 2D Echocardiogram. (Last one was in 05/2010), request cardiologist to interrogate pacer, and arrange EEG. Head CT scan is devoid of acute findings, and MRI is not feasible, due to presence of PPM. Meanwhile, neuro-checks, and PT/OT.   2. Right hip pain: This is an incidental finding on physical exam. Will check hip X-ray  to evaluate for bony injury.  3. Altered mental status: Patient appears very forgetful, and has no memory of event. Suspect this may not be new, and is due to dementia. Will check B12/Folate, RPR, TSH.  4. Hyponatremia: Sodium level is significantly low at 126. This may in part, be contributory to AMS and possibly, fall. Will address with saline infusion, but check, cortisol, TSH, urine and serum osmolalities. Patient is not n diuretics  5. Dehydration: Patient has elevated BUN/creatinine ration ,with creatinine 1.03, BUN 25. Will address with iv fluids. This may be due to poor oral intake.  6. CAD (coronary artery disease): Appears stable.asymptomatic. Cardiac enzymes will be cycled for completeness.  7. HTN (hypertension): Controlled on pre-admission antihypertensives.  8. GERD (gastroesophageal reflux disease): Asymptomatic at this time.  9. Seizure disorder: Will continue pre-admission anticonvulsants, and check Depakot  and Tegretol levels. Will also check total CK.  10. Sinus node dysfunction: Patient is s/p PPM 06/2012. Cardiac monitor reveals SR and occasional paced complexes. As described above, will request pacer check.  11. DM: This is type 2,and appears reasonably controlled, based on random blood glucose of 136. Will manage with SSI for now, and check HBA1C.  12. Query abdominal pain. This was not elicited form patient by me, abdomen was entirely benign on physical examination, and abdominal/pelvic CT scan was devoid of acute findings.   Further management will depend on clinical course.   Note: Discontinued empiric Rocephin, which was commenced in the ED, as U/A is negative.   Comment: patient is FULL CODE.  Time Spent on Admission: 1 hour.   Deziyah Arvin,CHRISTOPHER 03/30/2013, 8:10 AM

## 2013-03-30 NOTE — Evaluation (Signed)
Received call from the ER physician about patient who was noted to have fallen at the nursing home. Per report, patient is demented on presentation and ER physician is able to obtain very little information in terms of history. Thus far, workup is been significant for elevated LFTs, serum sodium of 126, and questionable findings for UTI. Head CT showed chronic changes but is otherwise normal. There is a question of pneumobilia status post cholecystectomy on CT of the abdomen. Patient is also atrial paced on EKG per report and is pending pacer interrogation. Discussed with EDP that while findings are fairly nonspecific, hyponatremia and UTI are reasonable causes. Plan for broad-spectrum coverage as well as clinical evaluation of volume status in setting of hyponatremia. Temporary holding orders have been placed. Patient overall clinically stable.

## 2013-03-30 NOTE — Progress Notes (Signed)
CRITICAL VALUE ALERT  Critical value received:  CK-MB 8.6  Date of notification:  03/27/13  Time of notification:  1013  Critical value read back:yes  Nurse who received alert:  Wilfred Curtis   MD notified (1st page):  Dr. Brien Few  Time of first page:  1015  MD notified (2nd page):  Time of second page:  Responding MD:  Dr. Brien Few  Time MD responded:  1021

## 2013-03-30 NOTE — Consult Note (Signed)
ELECTROPHYSIOLOGY CONSULT NOTE     Primary Care Physician: Pearla Dubonnet, MD Referring Physician:  Dr Brien Few  Admit Date: 03/29/2013  Reason for consultation:  Pacemaker interrogation/ possible syncope  [name] [name] is a 79 y.o. female with a h/o advanced dementia who presents following a fall.  She is unaware of her fall and is unable to provide further history.  See Dr Alison Murray note for details. Presently, she is without complaint.  Today, she denies symptoms of palpitations, chest pain, shortness of breath, orthopnea, PND, lower extremity edema, dizziness, presyncope, syncope, or neurologic sequela. She admits to very poor vision and has difficult ambulating because of this.  The patient is tolerating medications without difficulties and is otherwise without complaint today.   Past Medical History  Diagnosis Date  . Diabetes mellitus   . Hypertension   . Acid reflux   . Seizures   . Coronary artery disease 2003    60-75%D2, 50% D1, 25% left circ, 20% LAD, normal LVF  . Dyslipidemia   . Stroke   . LBBB (left bundle branch block)   . Confusion    Past Surgical History  Procedure Laterality Date  . Cholecystectomy    . Abdominal hysterectomy    . Appendectomy    . Iridectomy    . Mastoidectomy    . Eye surgery      cataracts/ iridectomy    . aspirin EC  325 mg Oral Daily  . carbamazepine  400 mg Oral BID  . cholecalciferol  1,000 Units Oral BID  . clopidogrel  75 mg Oral Daily  . divalproex  125 mg Oral BID  . ferrous sulfate  325 mg Oral Q breakfast  . heparin  5,000 Units Subcutaneous Q8H  . insulin aspart  0-9 Units Subcutaneous TID WC  . irbesartan  300 mg Oral Daily  . isosorbide mononitrate  60 mg Oral Daily  . LORazepam  0.25 mg Oral See admin instructions  . metoprolol succinate  50 mg Oral Daily  . olopatadine  1 drop Both Eyes Daily  . pantoprazole  40 mg Oral Daily  . senna-docusate  1 tablet Oral BID  . simvastatin  20 mg Oral QHS  . sodium chloride  3  mL Intravenous Q12H  . sodium chloride  3 mL Intravenous Q12H  . tobramycin  1 drop Both Eyes Q6H  . triamcinolone ointment   Topical Daily   . sodium chloride 75 mL/hr at 03/30/13 1036    No Known Allergies  History   Social History  . Marital Status: Divorced    Spouse Name: N/A    Number of Children: N/A  . Years of Education: N/A   Occupational History  . Not on file.   Social History Main Topics  . Smoking status: Never Smoker   . Smokeless tobacco: Never Used  . Alcohol Use: No  . Drug Use: No  . Sexually Active: No   Other Topics Concern  . Not on file   Social History Narrative  . No narrative on file   ROS- All systems are reviewed and negative except as per the HPI above  Physical Exam: Telemetry:  Atrial pacing  Filed Vitals:   03/30/13 0800 03/30/13 0915 03/30/13 1030 03/30/13 1415  BP:  158/71 138/63 110/50  Pulse: 67 64 60 60  Temp:  98.9 F (37.2 C)  99.2 F (37.3 C)  TempSrc:  Oral  Oral  Resp: 20 18  18   Height:  5\' 5"  (1.651 m)  Weight:  220 lb 7.4 oz (100 kg)    SpO2: 100% 99%  100%    GEN- The patient is well appearing, alert and oriented x 3 today.   Head- normocephalic, atraumatic Eyes-  Very poor vision Ears- hearing intact Oropharynx- clear Neck- supple,   Lungs- Clear to ausculation bilaterally, normal work of breathing Heart- Regular rate and rhythm  GI- soft, NT, ND, + BS Extremities- no clubbing, cyanosis, or edema   EKG atrial pacing, LAHB, diffuse TWI  Labs:   Lab Results  Component Value Date   WBC 13.2* 03/30/2013   HGB 10.2* 03/30/2013   HCT 28.4* 03/30/2013   MCV 94.0 03/30/2013   PLT 169 03/30/2013    Recent Labs Lab 03/29/13 2226 03/30/13 0855  NA 126*  --   K 4.7  --   CL 91*  --   CO2 21  --   BUN 25*  --   CREATININE 1.03 0.95  CALCIUM 8.8  --   PROT 7.6  --   BILITOT 1.1  --   ALKPHOS 151*  --   ALT 130*  --   AST 313*  --   GLUCOSE 139*  --    Lab Results  Component Value Date    CKTOTAL 465* 03/30/2013   CKMB 8.6* 03/30/2013   TROPONINI <0.30 03/30/2013    Lab Results  Component Value Date   CHOL  Value: 163        ATP III CLASSIFICATION:  <200     mg/dL   Desirable  161-096  mg/dL   Borderline High  >=045    mg/dL   High        4/0/9811   CHOL  Value: 136        ATP III CLASSIFICATION:  <200     mg/dL   Desirable  914-782  mg/dL   Borderline High  >=956    mg/dL   High        01/04/3085   Lab Results  Component Value Date   HDL 61 02/10/2011   HDL 47 07/10/2010   Lab Results  Component Value Date   LDLCALC  Value: 86        Total Cholesterol/HDL:CHD Risk Coronary Heart Disease Risk Table                     Men   Women  1/2 Average Risk   3.4   3.3  Average Risk       5.0   4.4  2 X Average Risk   9.6   7.1  3 X Average Risk  23.4   11.0        Use the calculated Patient Ratio above and the CHD Risk Table to determine the patient's CHD Risk.        ATP III CLASSIFICATION (LDL):  <100     mg/dL   Optimal  578-469  mg/dL   Near or Above                    Optimal  130-159  mg/dL   Borderline  629-528  mg/dL   High  >413     mg/dL   Very High 01/07/4009   LDLCALC  Value: 68        Total Cholesterol/HDL:CHD Risk Coronary Heart Disease Risk Table                     Men  Women  1/2 Average Risk   3.4   3.3  Average Risk       5.0   4.4  2 X Average Risk   9.6   7.1  3 X Average Risk  23.4   11.0        Use the calculated Patient Ratio above and the CHD Risk Table to determine the patient's CHD Risk.        ATP III CLASSIFICATION (LDL):  <100     mg/dL   Optimal  161-096  mg/dL   Near or Above                    Optimal  130-159  mg/dL   Borderline  045-409  mg/dL   High  >811     mg/dL   Very High 08/04/4781   Lab Results  Component Value Date   TRIG 79 02/10/2011   TRIG 103 07/10/2010   Lab Results  Component Value Date   CHOLHDL 2.7 02/10/2011   CHOLHDL 2.9 07/10/2010   No results found for this basename: LDLDIRECT     Echo:  pending  ASSESSMENT AND PLAN:   The patient is  admitted after a probable fall.  She has very poor vision which makes ambulation difficult.  Her history is limited with her dementia.  Echo is pending. I will have [employer] interrogate her pacemaker in the AM.  If pacemaker interrogation is normal, then no further EP workup will be planned.   Hillis Range, MD 03/30/2013  4:33 PM

## 2013-03-31 ENCOUNTER — Encounter (HOSPITAL_COMMUNITY): Payer: Self-pay | Admitting: General Practice

## 2013-03-31 DIAGNOSIS — H109 Unspecified conjunctivitis: Secondary | ICD-10-CM | POA: Diagnosis present

## 2013-03-31 LAB — COMPREHENSIVE METABOLIC PANEL
Alkaline Phosphatase: 96 U/L (ref 39–117)
BUN: 22 mg/dL (ref 6–23)
Chloride: 100 mEq/L (ref 96–112)
Creatinine, Ser: 0.96 mg/dL (ref 0.50–1.10)
GFR calc Af Amer: 62 mL/min — ABNORMAL LOW (ref >90)
GFR calc non Af Amer: 54 mL/min — ABNORMAL LOW (ref >90)
Glucose, Bld: 98 mg/dL (ref 70–99)
Potassium: 4.6 mEq/L (ref 3.5–5.1)
Total Bilirubin: 1.9 mg/dL — ABNORMAL HIGH (ref 0.3–1.2)

## 2013-03-31 LAB — CBC
HCT: 25.8 % — ABNORMAL LOW (ref 36.0–46.0)
Hemoglobin: 9 g/dL — ABNORMAL LOW (ref 12.0–15.0)
MCV: 94.5 fL (ref 78.0–100.0)
WBC: 7.5 10*3/uL (ref 4.0–10.5)

## 2013-03-31 LAB — GLUCOSE, CAPILLARY
Glucose-Capillary: 84 mg/dL (ref 70–99)
Glucose-Capillary: 109 mg/dL — ABNORMAL HIGH (ref 70–99)
Glucose-Capillary: 198 mg/dL — ABNORMAL HIGH (ref 70–99)

## 2013-03-31 LAB — FOLATE RBC: RBC Folate: 1079 ng/mL — ABNORMAL HIGH (ref >=366)

## 2013-03-31 MED ORDER — CHLORHEXIDINE GLUCONATE CLOTH 2 % EX PADS
6.0000 | MEDICATED_PAD | Freq: Every day | CUTANEOUS | Status: AC
  Administered 2013-04-01 – 2013-04-05 (×5): 6 via TOPICAL

## 2013-03-31 MED ORDER — TOBRAMYCIN-DEXAMETHASONE 0.3-0.1 % OP SUSP
2.0000 [drp] | Freq: Four times a day (QID) | OPHTHALMIC | Status: DC
  Administered 2013-03-31 – 2013-04-07 (×30): 2 [drp] via OPHTHALMIC
  Filled 2013-03-31 (×2): qty 2.5

## 2013-03-31 MED ORDER — MUPIROCIN 2 % EX OINT
1.0000 "application " | TOPICAL_OINTMENT | Freq: Two times a day (BID) | CUTANEOUS | Status: AC
  Administered 2013-03-31 – 2013-04-05 (×10): 1 via NASAL
  Filled 2013-03-31: qty 22

## 2013-03-31 NOTE — Procedures (Signed)
EEG report.  Brief clinical history: 79 years old female with history of recurrent syncope, stroke, seizures, and progressive forgetfulness. Technique: this is a 17 channel routine scalp EEG performed at the bedside with bipolar and monopolar montages arranged in accordance to the international 10/20 system of electrode placement. One channel was dedicated to EKG recording.  The study was performed during wakefulness, drowsiness, and stage 2 sleep. Intermittent photic stimulation was  utilized as activating procedure.  Description: As the study begins, she becomes drowsy and rapidly falls to asleep.  However, during wakefulness the overall record is characterized by diffuse theta activity (6-7 Hz). Stage 2 sleep showed symmetric and synchronous sleep spindles without intermixed epileptiform discharges. Intermittent photic stimulation did not induce a normal driving response.  No focal or generalized epileptiform discharges noted.  EKG showed sinus rhythm.  Impression: this is a mildly abnormal abnormal awake and asleep EEG with findings suggestive of a mild, nonspecific encephalopathy. Please, be aware that the absence of epileptiform discharges does not exclude the possibility of epilepsy.  Clinical correlation is advised.  Wyatt Portela, MD

## 2013-03-31 NOTE — Progress Notes (Signed)
Pacemaker interrogation- normal pacemaker function, no detected arrhythmias      Pacemaker- MDT Jana Half DR Battery status- good (longevity 9 yrs) No arrhythmias  Atrial lead: p waves 1.4, Imp 353, threshold 0.5V@0 , V lead: R waves 12, imp 500, threshold 0.5V@0 .  AP 97%,  VP 68% Presenting rhythm is AP/VS at 60 bpm with AV delay at 263msec/300msec. No changes made See paper chart for full detials  No further EP workup planned

## 2013-03-31 NOTE — Evaluation (Signed)
Physical Therapy Evaluation Patient Details Name: [name] [name] MRN: [id number] DOB: Sep 05, 1928 Today's Date: 03/31/2013 Time: 4098-1191 PT Time Calculation (min): 28 min  PT Assessment / Plan / Recommendation Clinical Impression  Pt s/p fall.  Will benefit from PT to address endurance and balance issues.  Pt unaware she was in hospital.  Feel that she cannot see well either.  Will need NHP.      PT Assessment  Patient needs continued PT services    Follow Up Recommendations  SNF;Supervision/Assistance - 24 hour        Barriers to Discharge Decreased caregiver support      Equipment Recommendations  None recommended by PT         Frequency Min 2X/week    Precautions / Restrictions Precautions Precautions: Fall Restrictions Weight Bearing Restrictions: No   Pertinent Vitals/Pain VSS, No pain      Mobility  Bed Mobility Bed Mobility: Supine to Sit;Sitting - Scoot to Edge of Bed Supine to Sit: 1: +2 Total assist Supine to Sit: Patient Percentage: 50% Sitting - Scoot to Edge of Bed: 1: +2 Total assist Sitting - Scoot to Edge of Bed: Patient Percentage: 50% Details for Bed Mobility Assistance: Assistance to lift trunk as well as assist legs off bed. Vc's for sequence.  Very rigid initially with extension noted in full body.  Pt was soaked with urine on arrival.  Pt changed her gown and bed and cleaned urine off of her.   Transfers Transfers: Sit to Stand;Stand to Sit;Stand Pivot Transfers Sit to Stand: 1: +2 Total assist;With upper extremity assist;From bed Sit to Stand: Patient Percentage: 50% Stand to Sit: 1: +2 Total assist;With upper extremity assist;To chair/3-in-1 Stand to Sit: Patient Percentage: 50% Stand Pivot Transfers: 1: +2 Total assist Stand Pivot Transfers: Patient Percentage: 50% Details for Transfer Assistance: Vc's for hand placement.  Pt needed assist for hip extension.  Once up had difficulty standing fully upright and maintaining that posture.  Needed  incr assist to weight shift with difficulty weight shifting to left to step right LE.  Once PT assisted with weight shifting, pt began to step.  Instead of pivotal steps, pt stepping sideways until facilitated to turn and sit in chair.  Question if pt's vision was an issue in this as she did not seem to be able to see well.   Ambulation/Gait Ambulation/Gait Assistance: Not tested (comment) Stairs: No Wheelchair Mobility Wheelchair Mobility: No         PT Diagnosis: Generalized weakness  PT Problem List: Decreased activity tolerance;Decreased balance;Decreased strength;Decreased mobility;Decreased safety awareness;Decreased knowledge of use of DME;Decreased knowledge of precautions PT Treatment Interventions: DME instruction;Functional mobility training;Therapeutic activities;Gait training;Therapeutic exercise;Balance training;Patient/family education;Wheelchair mobility training   PT Goals Acute Rehab PT Goals PT Goal Formulation: With patient Time For Goal Achievement: 04/14/13 Potential to Achieve Goals: Good Pt will go Supine/Side to Sit: with rail;with min assist PT Goal: Supine/Side to Sit - Progress: Goal set today Pt will Sit at Edge of Bed: with modified independence;6-10 min;with bilateral upper extremity support PT Goal: Sit at Edge Of Bed - Progress: Goal set today Pt will go Sit to Stand: with supervision;with upper extremity assist PT Goal: Sit to Stand - Progress: Goal set today Pt will Transfer Bed to Chair/Chair to Bed: with min assist PT Transfer Goal: Bed to Chair/Chair to Bed - Progress: Goal set today Pt will Ambulate: 1 - 15 feet;with mod assist;with least restrictive assistive device PT Goal: Ambulate - Progress: Goal set today Pt  will Propel Wheelchair: 51 - 150 feet;with min assist PT Goal: Propel Wheelchair - Progress: Goal set today  Visit Information  Last PT Received On: 03/31/13 Assistance Needed: +2 PT/OT Co-Evaluation/Treatment: Yes    Subjective  Data  Subjective: "I want to try." Patient Stated Goal: to go back to NH   Prior Functioning  Home Living Lives With: Other (Comment) Available Help at Discharge: Skilled Nursing Facility Prior Function Level of Independence: Needs assistance Needs Assistance: Bathing;Dressing Communication Communication: No difficulties    Cognition  Cognition Arousal/Alertness: Awake/alert Behavior During Therapy: WFL for tasks assessed/performed Overall Cognitive Status: History of cognitive impairments - at baseline Area of Impairment: Memory Memory: Decreased recall of precautions    Extremity/Trunk Assessment Right Upper Extremity Assessment RUE ROM/Strength/Tone: Deficits RUE ROM/Strength/Tone Deficits: Decreased strength in BUE grossly assesses 3+/5.  Limited ROM with shoulder flexion approx 100 degrees.  Left Upper Extremity Assessment LUE ROM/Strength/Tone: Deficits LUE ROM/Strength/Tone Deficits: Decreased strength in BUE grossly assesses 3+/5.  Limited ROM in shoulder flexion approx 100 degrees Right Lower Extremity Assessment RLE ROM/Strength/Tone: Deficits RLE ROM/Strength/Tone Deficits: knee 3/5, hip 2-/5, ankle 2+/5 Left Lower Extremity Assessment LLE ROM/Strength/Tone: Deficits LLE ROM/Strength/Tone Deficits: knee 3/5, hip 2-/5, ankle 2+/5 Trunk Assessment Trunk Assessment: Kyphotic   Balance Static Sitting Balance Static Sitting - Balance Support: Bilateral upper extremity supported;Feet supported Static Sitting - Level of Assistance: 5: Stand by assistance Static Sitting - Comment/# of Minutes: 3 minutes, initially upon siting needed min assist but was able to sit alone after steadied.   Static Standing Balance Static Standing - Balance Support: Bilateral upper extremity supported;During functional activity Static Standing - Level of Assistance: 1: +2 Total assist;Patient percentage (comment);Other (comment) (pt =50%) Static Standing - Comment/# of Minutes: Needed incr  assist for steadying in static stance.  End of Session PT - End of Session Equipment Utilized During Treatment: Gait belt Activity Tolerance: Patient limited by fatigue Patient left: in chair;with call bell/phone within reach Nurse Communication: Mobility status       INGOLD,Karem Tomaso 03/31/2013, 4:41 PM [facility] Acute Rehabilitation [phone] (pager)

## 2013-03-31 NOTE — Evaluation (Addendum)
Occupational Therapy Evaluation Patient Details Name: [name] [name] MRN: [id number] DOB: [birth year] Today's Date: 03/31/2013 Time: 7829-5621 OT Time Calculation (min): 25 min  OT Assessment / Plan / Recommendation Clinical Impression    Pt s/p fall. Pt at Mod/Max A level for LB ADLs.  Pt unaware she was in hospital. Feel that she cannot see well either. Pt planning to d/c back to SNF. OT not setting goals due to plan to d/c to snf, however, if plan changes, will set goals.      OT Assessment  Patient needs continued OT Services    Follow Up Recommendations  SNF;Supervision/Assistance - 24 hour    Barriers to Discharge      Equipment Recommendations  Other (comment) (defer to SNF)    Recommendations for Other Services    Frequency  Min 2X/week    Precautions / Restrictions Precautions Precautions: Fall Restrictions Weight Bearing Restrictions: No   Pertinent Vitals/Pain No pain reported.     ADL  Eating/Feeding: Independent Where Assessed - Eating/Feeding: Chair Grooming: Minimal assistance Where Assessed - Grooming: Supported sitting Upper Body Bathing: Minimal assistance Where Assessed - Upper Body Bathing: Unsupported sitting Lower Body Bathing: Moderate assistance;Maximal assistance Where Assessed - Lower Body Bathing: Supported sit to stand Upper Body Dressing: Minimal assistance Where Assessed - Upper Body Dressing: Unsupported sitting Lower Body Dressing: Maximal assistance;Moderate assistance Where Assessed - Lower Body Dressing: Supported sit to Pharmacist, hospital: +2 Total assistance Toilet Transfer: Patient Percentage: 50% Toilet Transfer Method: Surveyor, minerals: Other (comment) (simulated sit to stand from bed) Toileting - Clothing Manipulation and Hygiene: Moderate assistance Where Assessed - Toileting Clothing Manipulation and Hygiene: Lean right and/or left Tub/Shower Transfer Method: Not assessed Equipment Used: Gait belt ADL  Comments: Pt able to don and doff sock while sitting unsupported in chair with extra time. Pt at Mod/Max A level for LB dressing with sit to stand transfer.     OT Diagnosis: Generalized weakness;Acute pain  OT Problem List: Decreased strength;Decreased activity tolerance;Impaired balance (sitting and/or standing);Impaired vision/perception;Decreased safety awareness;Decreased knowledge of use of DME or AE;Decreased knowledge of precautions;Pain OT Treatment Interventions: Self-care/ADL training;DME and/or AE instruction;Therapeutic activities;Visual/perceptual remediation/compensation;Patient/family education;Balance training;Cognitive remediation/compensation   OT Goals    Visit Information  Last OT Received On: 03/31/13 Assistance Needed: +2 PT/OT Co-Evaluation/Treatment: Yes    Subjective Data      Prior Functioning     Home Living Lives With: Other (Comment) Available Help at Discharge: Skilled Nursing Facility Prior Function Level of Independence: Needs assistance Needs Assistance: Bathing;Dressing Communication Communication: No difficulties         Vision/Perception Vision - History Baseline Vision: Other (comment) Visual History: Other (comment) (chronic conjunctivitis)   Cognition  Cognition Arousal/Alertness: Awake/alert Behavior During Therapy: WFL for tasks assessed/performed Overall Cognitive Status: History of cognitive impairments - at baseline Area of Impairment: Memory Memory: Decreased recall of precautions    Extremity/Trunk Assessment Right Upper Extremity Assessment RUE ROM/Strength/Tone: Deficits RUE ROM/Strength/Tone Deficits: Decreased strength in BUE grossly assesses 3+/5.  Limited ROM with shoulder flexion approx 100 degrees.  Left Upper Extremity Assessment LUE ROM/Strength/Tone: Deficits LUE ROM/Strength/Tone Deficits: Decreased strength in BUE grossly assesses 3+/5.  Limited ROM in shoulder flexion approx 100 degrees    Mobility Bed  Mobility Bed Mobility: Supine to Sit;Sitting - Scoot to Edge of Bed Supine to Sit: 1: +2 Total assist Supine to Sit: Patient Percentage: 50% Sitting - Scoot to Edge of Bed: 1: +2 Total assist Sitting - Scoot to [place]  of Bed: Patient Percentage: 50% Details for Bed Mobility Assistance: Assistance to lift trunk as well as assist legs off bed. Vc's for sequence.  Very rigid initially with extension noted in full body.  Pt was soaked with urine on arrival.  Pt changed her gown and bed and cleaned urine off of her.   Transfers Transfers: Sit to Stand;Stand to Sit Sit to Stand: 1: +2 Total assist;With upper extremity assist;From bed Sit to Stand: Patient Percentage: 50% Stand to Sit: 1: +2 Total assist;With upper extremity assist;To chair/3-in-1 Stand to Sit: Patient Percentage: 50% Details for Transfer Assistance: Vc's for hand placement.  Pt needed assist for hip extension.  Once up had difficulty standing fully upright and maintaining that posture.  Needed incr assist to weight shift with difficulty weight shifting to left to step right LE.  Once PT assisted with weight shifting, pt began to step.  Instead of pivotal steps, pt stepping sideways until facilitated to turn and sit in chair.  Question if pt's vision was an issue in this as she did not seem to be able to see well.             End of Session OT - End of Session Equipment Utilized During Treatment: Gait belt Activity Tolerance: Patient limited by fatigue Patient left: in chair;with call bell/phone within reach  [employer] OTR/L [phone] 03/31/2013, 5:06 PM

## 2013-03-31 NOTE — Progress Notes (Signed)
EEG completed.

## 2013-03-31 NOTE — Progress Notes (Signed)
CSW received referral for SNF placement. CSW will meet with the pt and/or family to search for placement.  Yazleemar Strassner, LCSW-A Clinical Social Worker [phone]   

## 2013-03-31 NOTE — Progress Notes (Signed)
Subjective: [name] [name] is alert and conversive this morning.  Her eyes are matted shut with dried exudate but she can open them.  Left greater than right.  Eyes are nonpainful.  Not sure whether or not she had syncope yesterday.  She does have a seizure disorder and has had multiple episodes of syncope in the past all with negative workups.  Felt to likely be secondary to either breakthrough seizures, orthostasis, or arrhythmia.  Objective: Weight change:   Intake/Output Summary (Last 24 hours) at 03/31/13 0747 Last data filed at 03/30/13 2314  Gross per 24 hour  Intake   1305 ml  Output    900 ml  Net    405 ml   Filed Vitals:   03/30/13 2100 03/30/13 2103 03/30/13 2106 03/31/13 0456  BP: 121/68 136/71 119/74 131/70  Pulse: 60 60 59 60  Temp: 99.4 F (37.4 C)   98.4 F (36.9 C)  TempSrc: Oral   Oral  Resp: 20 20 20 20   Height:      Weight:      SpO2: 97% 99% 98% 98%    General Appearance: Alert, cooperative, no distress, appears stated age, eyes are matted with dried exudate consistent with chronic conjunctivitis Lungs: Clear to auscultation bilaterally, respirations unlabored Heart: Regular rate and rhythm, S1 and S2 normal, no murmur, rub or gallop Abdomen: Soft, non-tender, bowel sounds active all four quadrants, no masses, no organomegaly Extremities: Extremities normal, atraumatic, no cyanosis or edema Neuro: No focal neurologic deficit.  Moves all extremities  Lab Results: Results for orders placed during the hospital encounter of 03/29/13 (from the past 48 hour(s))  CBC     Status: Abnormal   Collection Time    03/29/13 10:26 PM      Result Value Range   WBC 5.8  4.0 - 10.5 K/uL   RBC 3.35 (*) 3.87 - 5.11 MIL/uL   Hemoglobin 11.2 (*) 12.0 - 15.0 g/dL   HCT 16.1 (*) 09.6 - 04.5 %   MCV 94.3  78.0 - 100.0 fL   MCH 33.4  26.0 - 34.0 pg   MCHC 35.4  30.0 - 36.0 g/dL   RDW 40.9  81.1 - 91.4 %   Platelets 182  150 - 400 K/uL  COMPREHENSIVE METABOLIC PANEL     Status:  Abnormal   Collection Time    03/29/13 10:26 PM      Result Value Range   Sodium 126 (*) 135 - 145 mEq/L   Potassium 4.7  3.5 - 5.1 mEq/L   Chloride 91 (*) 96 - 112 mEq/L   CO2 21  19 - 32 mEq/L   Glucose, Bld 139 (*) 70 - 99 mg/dL   BUN 25 (*) 6 - 23 mg/dL   Creatinine, Ser 7.82  0.50 - 1.10 mg/dL   Calcium 8.8  8.4 - 95.6 mg/dL   Total Protein 7.6  6.0 - 8.3 g/dL   Albumin 3.4 (*) 3.5 - 5.2 g/dL   AST 213 (*) 0 - 37 U/L   ALT 130 (*) 0 - 35 U/L   Alkaline Phosphatase 151 (*) 39 - 117 U/L   Total Bilirubin 1.1  0.3 - 1.2 mg/dL   GFR calc non Af Amer 49 (*) >90 mL/min   GFR calc Af Amer 57 (*) >90 mL/min   Comment:            The eGFR has been calculated     using the CKD EPI equation.     This  calculation has not been     validated in all clinical     situations.     eGFR's persistently     <90 mL/min signify     possible Chronic Kidney Disease.  GLUCOSE, CAPILLARY     Status: Abnormal   Collection Time    03/29/13 10:36 PM      Result Value Range   Glucose-Capillary 136 (*) 70 - 99 mg/dL  POCT I-STAT TROPONIN I     Status: None   Collection Time    03/29/13 10:40 PM      Result Value Range   Troponin i, poc 0.00  0.00 - 0.08 ng/mL   Comment 3            Comment: Due to the release kinetics of cTnI,     a negative result within the first hours     of the onset of symptoms does not rule out     myocardial infarction with certainty.     If myocardial infarction is still suspected,     repeat the test at appropriate intervals.  URINALYSIS, ROUTINE W REFLEX MICROSCOPIC     Status: Abnormal   Collection Time    03/29/13 11:59 PM      Result Value Range   Color, Urine AMBER (*) YELLOW   Comment: BIOCHEMICALS MAY BE AFFECTED BY COLOR   APPearance CLEAR  CLEAR   Specific Gravity, Urine 1.017  1.005 - 1.030   pH 5.5  5.0 - 8.0   Glucose, UA NEGATIVE  NEGATIVE mg/dL   Hgb urine dipstick NEGATIVE  NEGATIVE   Bilirubin Urine SMALL (*) NEGATIVE   Ketones, ur 15 (*)  NEGATIVE mg/dL   Protein, ur NEGATIVE  NEGATIVE mg/dL   Urobilinogen, UA 1.0  0.0 - 1.0 mg/dL   Nitrite NEGATIVE  NEGATIVE   Leukocytes, UA SMALL (*) NEGATIVE  URINE MICROSCOPIC-ADD ON     Status: Abnormal   Collection Time    03/29/13 11:59 PM      Result Value Range   Squamous Epithelial / LPF FEW (*) RARE   WBC, UA 0-2  <3 WBC/hpf   RBC / HPF 0-2  <3 RBC/hpf   Bacteria, UA FEW (*) RARE   Casts HYALINE CASTS (*) NEGATIVE  BILIRUBIN, DIRECT     Status: Abnormal   Collection Time    03/30/13  2:19 AM      Result Value Range   Bilirubin, Direct 1.4 (*) 0.0 - 0.3 mg/dL  LIPASE, BLOOD     Status: Abnormal   Collection Time    03/30/13  8:30 AM      Result Value Range   Lipase 7 (*) 11 - 59 U/L  POCT I-STAT TROPONIN I     Status: None   Collection Time    03/30/13  8:46 AM      Result Value Range   Troponin i, poc 0.02  0.00 - 0.08 ng/mL   Comment 3            Comment: Due to the release kinetics of cTnI,     a negative result within the first hours     of the onset of symptoms does not rule out     myocardial infarction with certainty.     If myocardial infarction is still suspected,     repeat the test at appropriate intervals.  CORTISOL     Status: None   Collection Time    03/30/13  8:55 AM  Result Value Range   Cortisol, Plasma 23.0     Comment: (NOTE)     AM:  4.3 - 22.4 ug/dL     PM:  3.1 - 96.0 ug/dL  TSH     Status: None   Collection Time    03/30/13  8:55 AM      Result Value Range   TSH 1.327  0.350 - 4.500 uIU/mL  OSMOLALITY     Status: None   Collection Time    03/30/13  8:55 AM      Result Value Range   Osmolality 278  275 - 300 mOsm/kg  VITAMIN B12     Status: None   Collection Time    03/30/13  8:55 AM      Result Value Range   Vitamin B-12 580  211 - 911 pg/mL  CK TOTAL AND CKMB     Status: Abnormal   Collection Time    03/30/13  8:55 AM      Result Value Range   Total CK 465 (*) 7 - 177 U/L   CK, MB 8.6 (*) 0.3 - 4.0 ng/mL   Comment:  CRITICAL RESULT CALLED TO, READ BACK BY AND VERIFIED WITH:     MILFORD J,RN 1015 03/30/13 SCALES H   Relative Index 1.8  0.0 - 2.5  TROPONIN I     Status: None   Collection Time    03/30/13  8:55 AM      Result Value Range   Troponin I <0.30  <0.30 ng/mL   Comment:            Due to the release kinetics of cTnI,     a negative result within the first hours     of the onset of symptoms does not rule out     myocardial infarction with certainty.     If myocardial infarction is still suspected,     repeat the test at appropriate intervals.  VALPROIC ACID LEVEL     Status: Abnormal   Collection Time    03/30/13  8:55 AM      Result Value Range   Valproic Acid Lvl <10.0 (*) 50.0 - 100.0 ug/mL  CBC     Status: Abnormal   Collection Time    03/30/13  8:55 AM      Result Value Range   WBC 13.2 (*) 4.0 - 10.5 K/uL   RBC 3.02 (*) 3.87 - 5.11 MIL/uL   Hemoglobin 10.2 (*) 12.0 - 15.0 g/dL   HCT 45.4 (*) 09.8 - 11.9 %   MCV 94.0  78.0 - 100.0 fL   MCH 33.8  26.0 - 34.0 pg   MCHC 35.9  30.0 - 36.0 g/dL   RDW 14.7  82.9 - 56.2 %   Platelets 169  150 - 400 K/uL  CREATININE, SERUM     Status: Abnormal   Collection Time    03/30/13  8:55 AM      Result Value Range   Creatinine, Ser 0.95  0.50 - 1.10 mg/dL   GFR calc non Af Amer 54 (*) >90 mL/min   GFR calc Af Amer 63 (*) >90 mL/min   Comment:            The eGFR has been calculated     using the CKD EPI equation.     This calculation has not been     validated in all clinical     situations.     eGFR's persistently     <  90 mL/min signify     possible Chronic Kidney Disease.  RPR     Status: None   Collection Time    03/30/13  8:55 AM      Result Value Range   RPR NON REACTIVE  NON REACTIVE  HEMOGLOBIN A1C     Status: Abnormal   Collection Time    03/30/13  8:55 AM      Result Value Range   Hemoglobin A1C 6.2 (*) <5.7 %   Comment: (NOTE)                                                                               According  to the ADA Clinical Practice Recommendations for 2011, when     HbA1c is used as a screening test:      >=6.5%   Diagnostic of Diabetes Mellitus               (if abnormal result is confirmed)     5.7-6.4%   Increased risk of developing Diabetes Mellitus     References:Diagnosis and Classification of Diabetes Mellitus,Diabetes     Care,2011,34(Suppl 1):S62-S69 and Standards of Medical Care in             Diabetes - 2011,Diabetes Care,2011,34 (Suppl 1):S11-S61.   Mean Plasma Glucose 131 (*) <117 mg/dL  MRSA PCR SCREENING     Status: Abnormal   Collection Time    03/30/13  9:08 AM      Result Value Range   MRSA by PCR POSITIVE (*) NEGATIVE   Comment:            The GeneXpert MRSA Assay (FDA     approved for NASAL specimens     only), is one component of a     comprehensive MRSA colonization     surveillance program. It is not     intended to diagnose MRSA     infection nor to guide or     monitor treatment for     MRSA infections.     RESULT CALLED TO, READ BACK BY AND VERIFIED WITH:     MILFORD,J RN 03/30/13 1347 WOOTEN,K  GLUCOSE, CAPILLARY     Status: Abnormal   Collection Time    03/30/13 11:47 AM      Result Value Range   Glucose-Capillary 171 (*) 70 - 99 mg/dL  OSMOLALITY, URINE     Status: Abnormal   Collection Time    03/30/13 12:28 PM      Result Value Range   Osmolality, Ur 350 (*) 390 - 1090 mOsm/kg  TROPONIN I     Status: None   Collection Time    03/30/13  3:40 PM      Result Value Range   Troponin I <0.30  <0.30 ng/mL   Comment:            Due to the release kinetics of cTnI,     a negative result within the first hours     of the onset of symptoms does not rule out     myocardial infarction with certainty.     If myocardial infarction is still suspected,     repeat the test  at appropriate intervals.  GLUCOSE, CAPILLARY     Status: None   Collection Time    03/30/13  4:25 PM      Result Value Range   Glucose-Capillary 99  70 - 99 mg/dL   Comment 1  Notify RN    TROPONIN I     Status: None   Collection Time    03/30/13  8:27 PM      Result Value Range   Troponin I <0.30  <0.30 ng/mL   Comment:            Due to the release kinetics of cTnI,     a negative result within the first hours     of the onset of symptoms does not rule out     myocardial infarction with certainty.     If myocardial infarction is still suspected,     repeat the test at appropriate intervals.  GLUCOSE, CAPILLARY     Status: None   Collection Time    03/30/13  9:25 PM      Result Value Range   Glucose-Capillary 84  70 - 99 mg/dL   Comment 1 Notify RN    CBC     Status: Abnormal   Collection Time    03/31/13  5:36 AM      Result Value Range   WBC 7.5  4.0 - 10.5 K/uL   RBC 2.73 (*) 3.87 - 5.11 MIL/uL   Hemoglobin 9.0 (*) 12.0 - 15.0 g/dL   HCT 16.1 (*) 09.6 - 04.5 %   MCV 94.5  78.0 - 100.0 fL   MCH 33.0  26.0 - 34.0 pg   MCHC 34.9  30.0 - 36.0 g/dL   RDW 40.9  81.1 - 91.4 %   Platelets 127 (*) 150 - 400 K/uL   Comment: PLATELET COUNT CONFIRMED BY SMEAR     REPEATED TO VERIFY  COMPREHENSIVE METABOLIC PANEL     Status: Abnormal   Collection Time    03/31/13  5:36 AM      Result Value Range   Sodium 130 (*) 135 - 145 mEq/L   Potassium 4.6  3.5 - 5.1 mEq/L   Chloride 100  96 - 112 mEq/L   Comment: DELTA CHECK NOTED   CO2 23  19 - 32 mEq/L   Glucose, Bld 98  70 - 99 mg/dL   BUN 22  6 - 23 mg/dL   Creatinine, Ser 7.82  0.50 - 1.10 mg/dL   Calcium 8.1 (*) 8.4 - 10.5 mg/dL   Total Protein 6.1  6.0 - 8.3 g/dL   Albumin 2.6 (*) 3.5 - 5.2 g/dL   AST 73 (*) 0 - 37 U/L   ALT 69 (*) 0 - 35 U/L   Alkaline Phosphatase 96  39 - 117 U/L   Total Bilirubin 1.9 (*) 0.3 - 1.2 mg/dL   GFR calc non Af Amer 54 (*) >90 mL/min   GFR calc Af Amer 62 (*) >90 mL/min   Comment:            The eGFR has been calculated     using the CKD EPI equation.     This calculation has not been     validated in all clinical     situations.     eGFR's persistently     <90  mL/min signify     possible Chronic Kidney Disease.    Studies/Results: Dg Hip Complete Right  03/30/2013  *RADIOLOGY REPORT*  Clinical  Data: Fall with right hip pain.  RIGHT HIP - COMPLETE 2+ VIEW  Comparison: None  Findings: There is no evidence of fracture, subluxation or dislocation. The joint spaces are unremarkable. No focal bony lesions are identified. No significant abnormalities are noted.  IMPRESSION: No evidence of acute abnormality.   Original Report Authenticated By: Harmon Pier, M.D.    Ct Head Wo Contrast  03/30/2013  *RADIOLOGY REPORT*  Clinical Data: Loss of consciousness after fall.  CT HEAD WITHOUT CONTRAST  Technique:  Contiguous axial images were obtained from the base of the skull through the vertex without contrast.  Comparison: 02/28/2013  Findings: Diffuse cerebral atrophy.  Ventricular dilatation consistent with central atrophy.  Focal areas of encephalomalacia in the right parietal lobe and left occipital lobe consistent with old infarcts.  There is associated ventricular dilatation due to negative mass effect.  These changes are stable since the previous study.  Old lacunar infarcts in the cerebellar hemispheres.  No mass effect or midline shift.  No abnormal extra-axial fluid collections.  Gray-white matter junctions are distinct.  Basal cisterns are not effaced.  No evidence of acute intracranial hemorrhage.  No depressed skull fractures.  Mucosal membrane thickening in the maxillary antra.  No acute air-fluid levels are demonstrated.  Vascular calcifications.  Postoperative changes in the right mastoid air cells with air-fluid levels demonstrated in the right mastoid region.  IMPRESSION: No acute intracranial abnormalities.  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  Postoperative changes in the right mastoid region with air-fluid level present.   Original Report Authenticated By: Burman Nieves, M.D.    Ct Abdomen Pelvis W  Contrast  03/30/2013  *RADIOLOGY REPORT*  Clinical Data: Loss of consciousness.  Elevated liver function studies.  Fall.  CT ABDOMEN AND PELVIS WITH CONTRAST  Technique:  Multidetector CT imaging of the abdomen and pelvis was performed following the standard protocol during bolus administration of intravenous contrast.  Contrast: OMNIPAQUE IOHEXOL 300 MG/ML  SOLN  Comparison: 08/09/2008  Findings: Technically limited study due to motion artifact. Atelectasis or fibrosis in the lung bases.  Surgical absence of the gallbladder.  Pneumobilia is likely postoperative.  This was present previously.  No focal liver lesions are appreciated.  Diffuse fatty infiltration of the pancreas.  Spleen, adrenal glands, kidneys, inferior vena cava, and retroperitoneal lymph nodes are unremarkable.  Calcification of the aorta without aneurysm.  The stomach and small bowel are decompressed.  Stool filled colon without distension.  No free air or free fluid in the abdomen.  Pelvis:  Bladder wall is not thickened.  Uterus is atrophic or surgically absent.  Diverticula in the sigmoid colon without diverticulitis.  The appendix is not identified.  No abnormal adnexal masses.  No free or loculated pelvic fluid collections.  No significant pelvic lymphadenopathy.  Degenerative changes throughout the lumbar spine.  Diffuse demineralization.  No vertebral compression fractures.  Normal alignment of the lumbar vertebrae.  Pelvis, sacrum, and hips appear intact.  IMPRESSION: No acute abnormalities demonstrated in the abdomen or pelvis. Surgical absence of the gallbladder with pneumobilia, likely postoperative.  No significant changes since previous study.   Original Report Authenticated By: Burman Nieves, M.D.    Dg Chest Port 1 View  03/30/2013  *RADIOLOGY REPORT*  Clinical Data: Altered mental status  PORTABLE CHEST - 1 VIEW  Comparison: 01/20/2013; 06/21/2012  Findings: Grossly unchanged cardiac silhouette and mediastinal contours  given slightly reduced lung volumes.  Stable positioning of support apparatus.  No  focal parenchymal opacities.  No pleural effusion or pneumothorax.  No definite evidence of edema. Unchanged bones.  IMPRESSION: No acute cardiopulmonary disease.   Original Report Authenticated By: Tacey Ruiz, MD    Medications: Scheduled Meds: . aspirin EC  325 mg Oral Daily  . carbamazepine  400 mg Oral BID  . cholecalciferol  1,000 Units Oral BID  . clopidogrel  75 mg Oral Daily  . divalproex  125 mg Oral BID  . ferrous sulfate  325 mg Oral Q breakfast  . heparin  5,000 Units Subcutaneous Q8H  . insulin aspart  0-9 Units Subcutaneous TID WC  . irbesartan  300 mg Oral Daily  . isosorbide mononitrate  60 mg Oral Daily  . LORazepam  0.25 mg Oral See admin instructions  . metoprolol succinate  50 mg Oral Daily  . olopatadine  1 drop Both Eyes Daily  . pantoprazole  40 mg Oral Daily  . senna-docusate  1 tablet Oral BID  . simvastatin  20 mg Oral QHS  . sodium chloride  3 mL Intravenous Q12H  . sodium chloride  3 mL Intravenous Q12H  . tobramycin-dexamethasone  2 drop Both Eyes Q6H  . triamcinolone ointment   Topical Daily   Continuous Infusions: . sodium chloride 75 mL/hr at 03/30/13 1036   PRN Meds:.sodium chloride, acetaminophen, acetaminophen, morphine injection, nitroGLYCERIN, ondansetron (ZOFRAN) IV, ondansetron, polyvinyl alcohol, sodium chloride  Assessment/Plan: 1. Fall: Patient presented following a fall, and precise circumstances still unclear at this time. Differential include a mechanical fall, vs syncopal episode, vs seizure episode. She has no obvious evidence of acute trauma, and no tongue biting. Patient was worked up for syncope during her hospitalization in 01/2013, so, will not repeat carotid duplex, and 2D Echocardiogram is pending. (Last one was in 05/2010), and appreciate interrogation of cardiac pacemaker which was normal, and EEG is pending. Head CT scan is devoid of acute  findings, and MRI is not feasible, due to presence of PPM. Meanwhile, neuro-checks, and PT/OT.  2. Right hip pain: This is an incidental finding on physical exam.  X-ray without fracture or acute abnormality 3. Altered mental status: Patient is chronically very forgetful, and has no memory of event.  This is not new, and is due to dementia. Will followup on Folate, RPR, and TSH and vitamin B 12 are normal.  4. Hyponatremia: Sodium level is improving and up to 130. This may in part, be contributory to AMS and possibly, fall.  Continue saline infusion, but check, cortisol, TSH, urine and serum osmolalities. Patient is not on diuretics  5. Dehydration: Patient has elevated BUN/creatinine but improving with BUN 22 and creatinine 0.96 today. Will continue iv fluids today and reassess be met in a.m.. This may be due to poor oral intake.  6. CAD (coronary artery disease): Appears stable.asymptomatic. Cardiac enzymes will be cycled for completeness.  7. HTN (hypertension): Controlled on pre-admission antihypertensives.  8. GERD (gastroesophageal reflux disease): Asymptomatic at this time.  9. Seizure disorder: Will continue pre-admission anticonvulsants, and check Depakot and Tegretol levels.  Valproic acid level is low.  Recheck in a.m. 10. Sinus node dysfunction: Patient is s/p PPM 06/2012. Cardiac monitor reveals SR and occasional paced complexes. As described above, pacer check is okay  11. DM: This is type 2,and appears reasonably controlled, based on random blood glucose of 136. Will manage with SSI for now, and check HBA1C.  12.  Abdominal pain - has resolved 13.  Conjunctivitis -  will switch to TobraDex  and see if this is more helpful 14.  Disposition - plan is for transfer back to facility probably tomorrow - depending on response to IV fluids etc.    LOS: 2 days   Pearla Dubonnet, MD 03/31/2013, 7:47 AM

## 2013-04-01 ENCOUNTER — Inpatient Hospital Stay (HOSPITAL_COMMUNITY): Payer: Medicare Other

## 2013-04-01 LAB — GLUCOSE, CAPILLARY: Glucose-Capillary: 146 mg/dL — ABNORMAL HIGH (ref 70–99)

## 2013-04-01 LAB — BASIC METABOLIC PANEL
BUN: 29 mg/dL — ABNORMAL HIGH (ref 6–23)
CO2: 19 mEq/L (ref 19–32)
Chloride: 100 mEq/L (ref 96–112)
Creatinine, Ser: 1.38 mg/dL — ABNORMAL HIGH (ref 0.50–1.10)
GFR calc Af Amer: 45 mL/min — ABNORMAL LOW (ref >90)
GFR calc non Af Amer: 39 mL/min — ABNORMAL LOW (ref >90)
Glucose, Bld: 135 mg/dL — ABNORMAL HIGH (ref 70–99)
Potassium: 3.7 mEq/L (ref 3.5–5.1)
Sodium: 131 mEq/L — ABNORMAL LOW (ref 135–145)

## 2013-04-01 LAB — URINALYSIS, ROUTINE W REFLEX MICROSCOPIC
Protein, ur: 100 mg/dL — AB
Specific Gravity, Urine: 1.026 (ref 1.005–1.030)
Urobilinogen, UA: 2 mg/dL — ABNORMAL HIGH (ref 0.0–1.0)

## 2013-04-01 LAB — CBC
HCT: 28 % — ABNORMAL LOW (ref 36.0–46.0)
MCH: 33.4 pg (ref 26.0–34.0)
MCHC: 34.6 g/dL (ref 30.0–36.0)
MCV: 96.6 fL (ref 78.0–100.0)
RDW: 12.9 % (ref 11.5–15.5)

## 2013-04-01 LAB — URINE MICROSCOPIC-ADD ON

## 2013-04-01 MED ORDER — ALBUTEROL SULFATE (5 MG/ML) 0.5% IN NEBU
INHALATION_SOLUTION | RESPIRATORY_TRACT | Status: AC
  Filled 2013-04-01: qty 0.5

## 2013-04-01 MED ORDER — DEXTROSE 5 % IV SOLN
1.0000 g | INTRAVENOUS | Status: DC
  Administered 2013-04-01: 1 g via INTRAVENOUS
  Filled 2013-04-01 (×2): qty 10

## 2013-04-01 MED ORDER — ALBUTEROL SULFATE (5 MG/ML) 0.5% IN NEBU
2.5000 mg | INHALATION_SOLUTION | Freq: Four times a day (QID) | RESPIRATORY_TRACT | Status: DC | PRN
  Administered 2013-04-01 – 2013-04-04 (×2): 2.5 mg via RESPIRATORY_TRACT
  Filled 2013-04-01: qty 0.5

## 2013-04-01 NOTE — Progress Notes (Signed)
Pt c/o shortness of breath--states "ive been like this a long time". Temp 98.6-- Resp rate -- BP  113/42 HR 73 o2 sat 96 on room air.  Lung sounds with exp wheezing noted in upper airways.  Pt is more alert this evening--talkative and appropriate  Conversation.  Dr Nehemiah Settle on call and notified with orders received. Will cont to monitor. Dierdre Highman, RN

## 2013-04-01 NOTE — Care Management Note (Signed)
    Page 1 of 1   04/01/2013     2:26:10 PM   CARE MANAGEMENT NOTE 04/01/2013  Patient:  [name] [name],[name] [name]   Account Number:  [id number]  Date Initiated:  04/01/2013  Documentation initiated by:  GRAVES-BIGELOW,Fernie Grimm  Subjective/Objective Assessment:   Pt admited with syncope and fall. Pt is from [facility]. Plans to return to SNF.     Action/Plan:   CSW to assist with disposition needs.   Anticipated DC Date:  04/03/2013   Anticipated DC Plan:  SKILLED NURSING FACILITY  In-house referral  Clinical Social Worker      DC Planning Services  CM consult      Choice offered to / List presented to:             Status of service:  Completed, signed off Medicare Important Message given?   (If response is "NO", the following Medicare IM given date fields will be blank) Date Medicare IM given:   Date Additional Medicare IM given:    Discharge Disposition:  SKILLED NURSING FACILITY  Per UR Regulation:  Reviewed for med. necessity/level of care/duration of stay  If discussed at Long Length of Stay Meetings, dates discussed:    Comments:

## 2013-04-01 NOTE — Progress Notes (Signed)
Subjective: [name] [name] is somnolent this morning but able to be awakened.  No complaints.  She was seen by physical therapy yesterday and requires assistance from lying to sitting and sitting to standing.  She is not stable to ambulate at this time.  EEG revealed no obvious seizure activity.  Her eyes are feeling better and less matted.  No further episodes of LOC or obvious seizure activity or arrhythmia  Objective: Weight change:   Intake/Output Summary (Last 24 hours) at 04/01/13 0758 Last data filed at 04/01/13 0500  Gross per 24 hour  Intake 2027.5 ml  Output    200 ml  Net 1827.5 ml   Filed Vitals:   03/31/13 2140 03/31/13 2200 04/01/13 0001 04/01/13 0622  BP:    114/41  Pulse:    84  Temp: 100 F (37.8 C) 98.9 F (37.2 C) 98.7 F (37.1 C) 101.1 F (38.4 C)  TempSrc:    Axillary  Resp:    18  Height:      Weight:      SpO2:    100%   General Appearance: Somnolent but arousable, cooperative, no distress, appears stated age, eyes are much clearer today with less exudate  Lungs: Clear to auscultation bilaterally, respirations unlabored  Heart: Regular rate and rhythm, S1 and S2 normal, no murmur, rub or gallop  Abdomen: Soft, non-tender, bowel sounds active all four quadrants, no masses, no organomegaly  Extremities: Extremities normal, atraumatic, no cyanosis or edema  Neuro: No focal neurologic deficit. Moves all extremities   Lab Results: Results for orders placed during the hospital encounter of 03/29/13 (from the past 48 hour(s))  LIPASE, BLOOD     Status: Abnormal   Collection Time    03/30/13  8:30 AM      Result Value Range   Lipase 7 (*) 11 - 59 U/L  POCT I-STAT TROPONIN I     Status: None   Collection Time    03/30/13  8:46 AM      Result Value Range   Troponin i, poc 0.02  0.00 - 0.08 ng/mL   Comment 3            Comment: Due to the release kinetics of cTnI,     a negative result within the first hours     of the onset of symptoms does not rule out      myocardial infarction with certainty.     If myocardial infarction is still suspected,     repeat the test at appropriate intervals.  CORTISOL     Status: None   Collection Time    03/30/13  8:55 AM      Result Value Range   Cortisol, Plasma 23.0     Comment: (NOTE)     AM:  4.3 - 22.4 ug/dL     PM:  3.1 - 40.9 ug/dL  TSH     Status: None   Collection Time    03/30/13  8:55 AM      Result Value Range   TSH 1.327  0.350 - 4.500 uIU/mL  OSMOLALITY     Status: None   Collection Time    03/30/13  8:55 AM      Result Value Range   Osmolality 278  275 - 300 mOsm/kg  VITAMIN B12     Status: None   Collection Time    03/30/13  8:55 AM      Result Value Range   Vitamin B-12 580  211 -  911 pg/mL  FOLATE RBC     Status: Abnormal   Collection Time    03/30/13  8:55 AM      Result Value Range   RBC Folate 1079 (*) >=366 ng/mL   Comment: Reference range not established for pediatric patients.  CK TOTAL AND CKMB     Status: Abnormal   Collection Time    03/30/13  8:55 AM      Result Value Range   Total CK 465 (*) 7 - 177 U/L   CK, MB 8.6 (*) 0.3 - 4.0 ng/mL   Comment: CRITICAL RESULT CALLED TO, READ BACK BY AND VERIFIED WITH:     MILFORD J,RN 1015 03/30/13 SCALES H   Relative Index 1.8  0.0 - 2.5  TROPONIN I     Status: None   Collection Time    03/30/13  8:55 AM      Result Value Range   Troponin I <0.30  <0.30 ng/mL   Comment:            Due to the release kinetics of cTnI,     a negative result within the first hours     of the onset of symptoms does not rule out     myocardial infarction with certainty.     If myocardial infarction is still suspected,     repeat the test at appropriate intervals.  VALPROIC ACID LEVEL     Status: Abnormal   Collection Time    03/30/13  8:55 AM      Result Value Range   Valproic Acid Lvl <10.0 (*) 50.0 - 100.0 ug/mL  CBC     Status: Abnormal   Collection Time    03/30/13  8:55 AM      Result Value Range   WBC 13.2 (*) 4.0 - 10.5 K/uL    RBC 3.02 (*) 3.87 - 5.11 MIL/uL   Hemoglobin 10.2 (*) 12.0 - 15.0 g/dL   HCT 95.6 (*) 21.3 - 08.6 %   MCV 94.0  78.0 - 100.0 fL   MCH 33.8  26.0 - 34.0 pg   MCHC 35.9  30.0 - 36.0 g/dL   RDW 57.8  46.9 - 62.9 %   Platelets 169  150 - 400 K/uL  CREATININE, SERUM     Status: Abnormal   Collection Time    03/30/13  8:55 AM      Result Value Range   Creatinine, Ser 0.95  0.50 - 1.10 mg/dL   GFR calc non Af Amer 54 (*) >90 mL/min   GFR calc Af Amer 63 (*) >90 mL/min   Comment:            The eGFR has been calculated     using the CKD EPI equation.     This calculation has not been     validated in all clinical     situations.     eGFR's persistently     <90 mL/min signify     possible Chronic Kidney Disease.  RPR     Status: None   Collection Time    03/30/13  8:55 AM      Result Value Range   RPR NON REACTIVE  NON REACTIVE  HEMOGLOBIN A1C     Status: Abnormal   Collection Time    03/30/13  8:55 AM      Result Value Range   Hemoglobin A1C 6.2 (*) <5.7 %   Comment: (NOTE)  According to the ADA Clinical Practice Recommendations for 2011, when     HbA1c is used as a screening test:      >=6.5%   Diagnostic of Diabetes Mellitus               (if abnormal result is confirmed)     5.7-6.4%   Increased risk of developing Diabetes Mellitus     References:Diagnosis and Classification of Diabetes Mellitus,Diabetes     Care,2011,34(Suppl 1):S62-S69 and Standards of Medical Care in             Diabetes - 2011,Diabetes Care,2011,34 (Suppl 1):S11-S61.   Mean Plasma Glucose 131 (*) <117 mg/dL  MRSA PCR SCREENING     Status: Abnormal   Collection Time    03/30/13  9:08 AM      Result Value Range   MRSA by PCR POSITIVE (*) NEGATIVE   Comment:            The GeneXpert MRSA Assay (FDA     approved for NASAL specimens     only), is one component of a     comprehensive MRSA colonization     surveillance  program. It is not     intended to diagnose MRSA     infection nor to guide or     monitor treatment for     MRSA infections.     RESULT CALLED TO, READ BACK BY AND VERIFIED WITH:     MILFORD,J RN 03/30/13 1347 WOOTEN,K  GLUCOSE, CAPILLARY     Status: Abnormal   Collection Time    03/30/13 11:47 AM      Result Value Range   Glucose-Capillary 171 (*) 70 - 99 mg/dL  OSMOLALITY, URINE     Status: Abnormal   Collection Time    03/30/13 12:28 PM      Result Value Range   Osmolality, Ur 350 (*) 390 - 1090 mOsm/kg  TROPONIN I     Status: None   Collection Time    03/30/13  3:40 PM      Result Value Range   Troponin I <0.30  <0.30 ng/mL   Comment:            Due to the release kinetics of cTnI,     a negative result within the first hours     of the onset of symptoms does not rule out     myocardial infarction with certainty.     If myocardial infarction is still suspected,     repeat the test at appropriate intervals.  GLUCOSE, CAPILLARY     Status: None   Collection Time    03/30/13  4:25 PM      Result Value Range   Glucose-Capillary 99  70 - 99 mg/dL   Comment 1 Notify RN    TROPONIN I     Status: None   Collection Time    03/30/13  8:27 PM      Result Value Range   Troponin I <0.30  <0.30 ng/mL   Comment:            Due to the release kinetics of cTnI,     a negative result within the first hours     of the onset of symptoms does not rule out     myocardial infarction with certainty.     If myocardial infarction is still suspected,     repeat the test at appropriate intervals.  GLUCOSE, CAPILLARY  Status: None   Collection Time    03/30/13  9:25 PM      Result Value Range   Glucose-Capillary 84  70 - 99 mg/dL   Comment 1 Notify RN    CBC     Status: Abnormal   Collection Time    03/31/13  5:36 AM      Result Value Range   WBC 7.5  4.0 - 10.5 K/uL   RBC 2.73 (*) 3.87 - 5.11 MIL/uL   Hemoglobin 9.0 (*) 12.0 - 15.0 g/dL   HCT 16.1 (*) 09.6 - 04.5 %   MCV 94.5   78.0 - 100.0 fL   MCH 33.0  26.0 - 34.0 pg   MCHC 34.9  30.0 - 36.0 g/dL   RDW 40.9  81.1 - 91.4 %   Platelets 127 (*) 150 - 400 K/uL   Comment: PLATELET COUNT CONFIRMED BY SMEAR     REPEATED TO VERIFY  COMPREHENSIVE METABOLIC PANEL     Status: Abnormal   Collection Time    03/31/13  5:36 AM      Result Value Range   Sodium 130 (*) 135 - 145 mEq/L   Potassium 4.6  3.5 - 5.1 mEq/L   Chloride 100  96 - 112 mEq/L   Comment: DELTA CHECK NOTED   CO2 23  19 - 32 mEq/L   Glucose, Bld 98  70 - 99 mg/dL   BUN 22  6 - 23 mg/dL   Creatinine, Ser 7.82  0.50 - 1.10 mg/dL   Calcium 8.1 (*) 8.4 - 10.5 mg/dL   Total Protein 6.1  6.0 - 8.3 g/dL   Albumin 2.6 (*) 3.5 - 5.2 g/dL   AST 73 (*) 0 - 37 U/L   ALT 69 (*) 0 - 35 U/L   Alkaline Phosphatase 96  39 - 117 U/L   Total Bilirubin 1.9 (*) 0.3 - 1.2 mg/dL   GFR calc non Af Amer 54 (*) >90 mL/min   GFR calc Af Amer 62 (*) >90 mL/min   Comment:            The eGFR has been calculated     using the CKD EPI equation.     This calculation has not been     validated in all clinical     situations.     eGFR's persistently     <90 mL/min signify     possible Chronic Kidney Disease.  GLUCOSE, CAPILLARY     Status: None   Collection Time    03/31/13  8:05 AM      Result Value Range   Glucose-Capillary 92  70 - 99 mg/dL  GLUCOSE, CAPILLARY     Status: Abnormal   Collection Time    03/31/13 12:02 PM      Result Value Range   Glucose-Capillary 109 (*) 70 - 99 mg/dL  GLUCOSE, CAPILLARY     Status: Abnormal   Collection Time    03/31/13  4:50 PM      Result Value Range   Glucose-Capillary 126 (*) 70 - 99 mg/dL  GLUCOSE, CAPILLARY     Status: Abnormal   Collection Time    03/31/13 10:11 PM      Result Value Range   Glucose-Capillary 198 (*) 70 - 99 mg/dL   Comment 1 Notify RN    GLUCOSE, CAPILLARY     Status: Abnormal   Collection Time    04/01/13  7:14 AM      Result  Value Range   Glucose-Capillary 121 (*) 70 - 99 mg/dL   Comment 1  Notify RN      Studies/Results: Dg Hip Complete Right  03/30/2013  *RADIOLOGY REPORT*  Clinical Data: Fall with right hip pain.  RIGHT HIP - COMPLETE 2+ VIEW  Comparison: None  Findings: There is no evidence of fracture, subluxation or dislocation. The joint spaces are unremarkable. No focal bony lesions are identified. No significant abnormalities are noted.  IMPRESSION: No evidence of acute abnormality.   Original Report Authenticated By: Harmon Pier, M.D.    Dg Chest Port 1 View  03/30/2013  *RADIOLOGY REPORT*  Clinical Data: Altered mental status  PORTABLE CHEST - 1 VIEW  Comparison: 01/20/2013; 06/21/2012  Findings: Grossly unchanged cardiac silhouette and mediastinal contours given slightly reduced lung volumes.  Stable positioning of support apparatus.  No focal parenchymal opacities.  No pleural effusion or pneumothorax.  No definite evidence of edema. Unchanged bones.  IMPRESSION: No acute cardiopulmonary disease.   Original Report Authenticated By: Tacey Ruiz, MD    Medications: Scheduled Meds: . aspirin EC  325 mg Oral Daily  . carbamazepine  400 mg Oral BID  . Chlorhexidine Gluconate Cloth  6 each Topical Q0600  . cholecalciferol  1,000 Units Oral BID  . clopidogrel  75 mg Oral Daily  . ferrous sulfate  325 mg Oral Q breakfast  . heparin  5,000 Units Subcutaneous Q8H  . insulin aspart  0-9 Units Subcutaneous TID WC  . irbesartan  300 mg Oral Daily  . isosorbide mononitrate  60 mg Oral Daily  . LORazepam  0.25 mg Oral See admin instructions  . metoprolol succinate  50 mg Oral Daily  . mupirocin ointment  1 application Nasal BID  . olopatadine  1 drop Both Eyes Daily  . pantoprazole  40 mg Oral Daily  . senna-docusate  1 tablet Oral BID  . simvastatin  20 mg Oral QHS  . sodium chloride  3 mL Intravenous Q12H  . sodium chloride  3 mL Intravenous Q12H  . tobramycin-dexamethasone  2 drop Both Eyes Q6H   Continuous Infusions: . sodium chloride 75 mL/hr at 04/01/13 0220    PRN Meds:.sodium chloride, acetaminophen, acetaminophen, nitroGLYCERIN, polyvinyl alcohol, sodium chloride  Assessment/Plan:  1. Fall: Patient presented following a fall, and precise circumstances still unclear. Differential include a mechanical fall, vs syncopal episode, vs seizure episode. She has no obvious evidence of acute trauma, and no tongue biting. Patient was worked up for syncope during her hospitalization in 01/2013, and will not repeat carotid duplex.  2-D echocardiogram with EF 50-55% with no wall motion abnormalities.  Interrogation of cardiac pacemaker which was normal, and EEG revealed no obvious seizure activity. Head CT scan is devoid of acute findings, and MRI is not feasible, due to presence of PPM. Meanwhile, continue neuro-checks, and PT/OT.  Will discontinue Depakote to see if that helps with her being more alert 2. Right hip pain: This is an incidental finding on physical exam. X-ray without fracture or acute abnormality - symptomatically improving 3. Altered mental status: Patient is chronically very forgetful, and has no memory of event. This is not new, and is due to dementia.  Follow-up on Folate, RPR, and TSH and vitamin B 12 are normal.  4. Hyponatremia: Sodium level is improving and up to 130.  Repeat in a.m. This may in part, be contributory to AMS and possibly, fall. Continue saline infusion. Cortisol, TSH, urine and serum osmolalities are all essentially normal with  slight decrease in urine osmolality 5. Dehydration: Patient has elevated BUN/creatinine but improving with BUN 22 and creatinine 0.96 yesterday.  KVO IV fluids and check a be met today  6. CAD (coronary artery disease): Appears stable.asymptomatic. Cardiac enzymes negative on cycling 7. HTN (hypertension): Controlled on pre-admission antihypertensives.  8. GERD (gastroesophageal reflux disease): Asymptomatic at this time.  9. Seizure disorder: Will continue pre-admission anticonvulsants except will  discontinue Depakote which was for behavior control..  May need to increase Tegretol 10. Sinus node dysfunction: Patient is s/p PPM 06/2012. Cardiac monitor reveals SR and occasional paced complexes. As described above, pacer check is okay  11. DM: This is type 2,and appears reasonably controlled, based on random blood glucose of 136. Will manage with SSI for now, and check HBA1C.  12. Abdominal pain - has resolved  13. Conjunctivitis - clearing nicely with TobraDex 14. Disposition - plan is for transfer back to facility soon but will need to be more independent with ambulation et Karie Soda.  May need skilled nursing facility   LOS: 3 days   Pearla Dubonnet, MD 04/01/2013, 7:58 AM

## 2013-04-01 NOTE — Progress Notes (Addendum)
Patient very lethargic, tachypneic and groaning upon assessment this morning.  Temperature 102.4 after prn tylenol given.  Dr. Kevan Ny notified and new orders received.  Will continue to monitor. Artemis Loyal, Mitzi Hansen  14:12-Dr. Kevan Ny notified of patient's positive urinalysis.  Orders received to start IV Rocephin.  Will continue to monitor. [place], Mitzi Hansen

## 2013-04-01 NOTE — Progress Notes (Signed)
UR Completed Alayah Knouff Graves-Bigelow, RN,BSN [phone]  

## 2013-04-02 ENCOUNTER — Inpatient Hospital Stay (HOSPITAL_COMMUNITY): Payer: Medicare Other

## 2013-04-02 ENCOUNTER — Encounter (HOSPITAL_COMMUNITY): Payer: Self-pay | Admitting: Radiology

## 2013-04-02 LAB — GLUCOSE, CAPILLARY
Glucose-Capillary: 144 mg/dL — ABNORMAL HIGH (ref 70–99)
Glucose-Capillary: 199 mg/dL — ABNORMAL HIGH (ref 70–99)

## 2013-04-02 LAB — CBC WITH DIFFERENTIAL/PLATELET
Basophils Absolute: 0 K/uL (ref 0.0–0.1)
Basophils Relative: 0 % (ref 0–1)
Eosinophils Absolute: 0 K/uL (ref 0.0–0.7)
Eosinophils Relative: 0 % (ref 0–5)
HCT: 22.8 % — ABNORMAL LOW (ref 36.0–46.0)
Hemoglobin: 8 g/dL — ABNORMAL LOW (ref 12.0–15.0)
Hemoglobin: 8.3 g/dL — ABNORMAL LOW (ref 12.0–15.0)
Lymphocytes Relative: 8 % — ABNORMAL LOW (ref 12–46)
Lymphocytes Relative: 7 % — ABNORMAL LOW (ref 12–46)
Lymphs Abs: 1.1 K/uL (ref 0.7–4.0)
Lymphs Abs: 0.8 10*3/uL (ref 0.7–4.0)
MCH: 33.8 pg (ref 26.0–34.0)
MCHC: 35.1 g/dL (ref 30.0–36.0)
MCV: 96.2 fL (ref 78.0–100.0)
MCV: 94.7 fL (ref 78.0–100.0)
Monocytes Absolute: 0.8 K/uL (ref 0.1–1.0)
Monocytes Relative: 6 % (ref 3–12)
Monocytes Relative: 6 % (ref 3–12)
Neutro Abs: 11.1 K/uL — ABNORMAL HIGH (ref 1.7–7.7)
Neutrophils Relative %: 85 % — ABNORMAL HIGH (ref 43–77)
Neutrophils Relative %: 87 % — ABNORMAL HIGH (ref 43–77)
Platelets: 83 K/uL — ABNORMAL LOW (ref 150–400)
Platelets: 90 10*3/uL — ABNORMAL LOW (ref 150–400)
RBC: 2.37 MIL/uL — ABNORMAL LOW (ref 3.87–5.11)
RBC: 2.45 MIL/uL — ABNORMAL LOW (ref 3.87–5.11)
RDW: 13.3 % (ref 11.5–15.5)
WBC: 13 K/uL — ABNORMAL HIGH (ref 4.0–10.5)
WBC: 12.3 10*3/uL — ABNORMAL HIGH (ref 4.0–10.5)

## 2013-04-02 LAB — OCCULT BLOOD X 1 CARD TO LAB, STOOL: Fecal Occult Bld: NEGATIVE

## 2013-04-02 LAB — BASIC METABOLIC PANEL
CO2: 17 mEq/L — ABNORMAL LOW (ref 19–32)
Chloride: 106 mEq/L (ref 96–112)
Creatinine, Ser: 1.63 mg/dL — ABNORMAL HIGH (ref 0.50–1.10)
Potassium: 3.9 mEq/L (ref 3.5–5.1)

## 2013-04-02 MED ORDER — CIPROFLOXACIN IN D5W 400 MG/200ML IV SOLN
400.0000 mg | Freq: Two times a day (BID) | INTRAVENOUS | Status: DC
  Administered 2013-04-02 – 2013-04-03 (×2): 400 mg via INTRAVENOUS
  Filled 2013-04-02 (×3): qty 200

## 2013-04-02 MED ORDER — ONDANSETRON HCL 4 MG/2ML IJ SOLN: 4.0000 mg | Freq: Four times a day (QID) | INTRAMUSCULAR | Status: DC | PRN

## 2013-04-02 MED ORDER — CIPROFLOXACIN IN D5W 200 MG/100ML IV SOLN
200.0000 mg | Freq: Two times a day (BID) | INTRAVENOUS | Status: DC
  Filled 2013-04-02 (×2): qty 100

## 2013-04-02 MED ORDER — ONDANSETRON HCL 4 MG PO TABS: 4.0000 mg | ORAL_TABLET | Freq: Four times a day (QID) | ORAL | Status: DC | PRN

## 2013-04-02 MED ORDER — DEXTROSE 5 % IV SOLN
2.0000 g | INTRAVENOUS | Status: DC
  Administered 2013-04-02: 2 g via INTRAVENOUS
  Filled 2013-04-02 (×3): qty 2

## 2013-04-02 NOTE — Progress Notes (Signed)
MD called back for vomiting new orders received will monitor

## 2013-04-02 NOTE — Clinical Documentation Improvement (Signed)
CHANGE MENTAL STATUS DOCUMENTATION CLARIFICATION   THIS DOCUMENT IS NOT A PERMANENT PART OF THE MEDICAL RECORD  TO RESPOND TO THE THIS QUERY, FOLLOW THE INSTRUCTIONS BELOW:  1. If needed, update documentation for the patient's encounter via the notes activity.  2. Access this query again and click edit on the In [employer].  3. After updating, or not, click F2 to complete all highlighted (required) fields concerning your review. Select "additional documentation in the medical record" OR "no additional documentation provided".  4. Click Sign note button.  5. The deficiency will fall out of your In Basket *Please let us know if you are not able to complete this workflow by phone or e-mail (listed below).         04/02/13  Dear Dr. Kevan Ny,  In an effort to better capture your patient's severity of illness, reflect appropriate length of stay and utilization of resources, a review of the patient medical record has revealed the following indicators.    Please clarify the underlying cause of patient's altered mental status and relate that cause to the alteration in mental status:  Electrolyte/metabolic imbalance  Infectious process  Neurologic condition  Psychiatric condition  Respiratory condition  Other condition (please specify)  Possible Clinical Conditions?  -  Encephalopathy (describe type if known)                       Anoxic                       Septic                       Metabolic                       Toxic - Acute exacerbation of known dementia (indicate type) - Hyponatremia / Hypernatremia - Poisoning / Overdose - Hypoxemia / Hypoxia - Other Condition  Supporting Information: - Risk Factors: Dementia, Seizures, dehydration, hyponatremia, UTI found on admit,  Bacteremia Gram Negative - Signs/Symptoms: Syncope, AMS - Treatment: NS IV 69ml/hr, Rocephin IV   Reviewed: Pt has septic encephalopathy Thank You,  Beverley Fiedler  RN BSNClinical Documentation  Specialist: Hall Busing: [phone]  Health Information Management [place]

## 2013-04-02 NOTE — Progress Notes (Signed)
Subjective: Low is much more alert today but still lethargic. Found to have a UTI. Started on Rocephin and is improving. Eyes are much less on TobraDex. Syncope was likely multifactorial but UTI and infection and moderate dehydration probably contributed. Both sets of blood cultures drawn yesterday positive for gram-negative rods. Likely since the Rocephin because of positive clinical response. We'll not change antibiotic therapy as yet  Objective: Weight change:   Intake/Output Summary (Last 24 hours) at 04/02/13 0829 Last data filed at 04/01/13 1830  Gross per 24 hour  Intake 956.17 ml  Output      0 ml  Net 956.17 ml   Filed Vitals:   04/01/13 1610 04/01/13 2049 04/02/13 0540 04/02/13 0750  BP: 100/38 113/42 128/42 117/66  Pulse: 65 73 63 62  Temp: 98.2 F (36.8 C) 98.5 F (36.9 C) 98.4 F (36.9 C) 98 F (36.7 C)  TempSrc: Oral  Oral Oral  Resp: 20 24  22   Height:      Weight:   102.059 kg (225 lb)   SpO2: 98% 96% 95% 98%   General Appearance: Somnolent but arousable, cooperative, no distress, appears stated age, eyes are much clearer today with less exudate. She is much more conversive today and recognizes me readily HEENT: Eyes are clearing nicely, no exudate Lungs: Clear to auscultation bilaterally, respirations unlabored  Heart: Regular rate and rhythm, S1 and S2 normal, no murmur, rub or gallop  Abdomen: Soft, non-tender, bowel sounds active all four quadrants, no masses, no organomegaly  Extremities: Extremities normal, atraumatic, no cyanosis or edema  Neuro: No focal neurologic deficit. Moves all extremities   Lab Results: Results for orders placed during the hospital encounter of 03/29/13 (from the past 48 hour(s))  GLUCOSE, CAPILLARY     Status: Abnormal   Collection Time    03/31/13 12:02 PM      Result Value Range   Glucose-Capillary 109 (*) 70 - 99 mg/dL  GLUCOSE, CAPILLARY     Status: Abnormal   Collection Time    03/31/13  4:50 PM      Result Value  Range   Glucose-Capillary 126 (*) 70 - 99 mg/dL  GLUCOSE, CAPILLARY     Status: Abnormal   Collection Time    03/31/13 10:11 PM      Result Value Range   Glucose-Capillary 198 (*) 70 - 99 mg/dL   Comment 1 Notify RN    BASIC METABOLIC PANEL     Status: Abnormal   Collection Time    04/01/13  6:55 AM      Result Value Range   Sodium 131 (*) 135 - 145 mEq/L   Potassium 3.7  3.5 - 5.1 mEq/L   Chloride 100  96 - 112 mEq/L   CO2 16 (*) 19 - 32 mEq/L   Glucose, Bld 135 (*) 70 - 99 mg/dL   BUN 29 (*) 6 - 23 mg/dL   Creatinine, Ser 1.61 (*) 0.50 - 1.10 mg/dL   Calcium 7.9 (*) 8.4 - 10.5 mg/dL   GFR calc non Af Amer 39 (*) >90 mL/min   GFR calc Af Amer 45 (*) >90 mL/min   Comment:            The eGFR has been calculated     using the CKD EPI equation.     This calculation has not been     validated in all clinical     situations.     eGFR's persistently     <90  mL/min signify     possible Chronic Kidney Disease.  GLUCOSE, CAPILLARY     Status: Abnormal   Collection Time    04/01/13  7:14 AM      Result Value Range   Glucose-Capillary 121 (*) 70 - 99 mg/dL   Comment 1 Notify RN    URINALYSIS, ROUTINE W REFLEX MICROSCOPIC     Status: Abnormal   Collection Time    04/01/13  8:49 AM      Result Value Range   Color, Urine AMBER (*) YELLOW   Comment: BIOCHEMICALS MAY BE AFFECTED BY COLOR   APPearance TURBID (*) CLEAR   Specific Gravity, Urine 1.026  1.005 - 1.030   pH 5.0  5.0 - 8.0   Glucose, UA NEGATIVE  NEGATIVE mg/dL   Hgb urine dipstick LARGE (*) NEGATIVE   Bilirubin Urine LARGE (*) NEGATIVE   Ketones, ur 15 (*) NEGATIVE mg/dL   Protein, ur 045 (*) NEGATIVE mg/dL   Urobilinogen, UA 2.0 (*) 0.0 - 1.0 mg/dL   Nitrite POSITIVE (*) NEGATIVE   Leukocytes, UA LARGE (*) NEGATIVE  URINE MICROSCOPIC-ADD ON     Status: Abnormal   Collection Time    04/01/13  8:49 AM      Result Value Range   Squamous Epithelial / LPF FEW (*) RARE   WBC, UA TOO NUMEROUS TO COUNT  <3 WBC/hpf    RBC / HPF 0-2  <3 RBC/hpf   Bacteria, UA FEW (*) RARE  CARBAMAZEPINE LEVEL, TOTAL     Status: None   Collection Time    04/01/13 10:00 AM      Result Value Range   Carbamazepine Lvl 9.6  4.0 - 12.0 ug/mL  BASIC METABOLIC PANEL     Status: Abnormal   Collection Time    04/01/13 10:00 AM      Result Value Range   Sodium 132 (*) 135 - 145 mEq/L   Potassium 3.5  3.5 - 5.1 mEq/L   Chloride 100  96 - 112 mEq/L   CO2 19  19 - 32 mEq/L   Glucose, Bld 109 (*) 70 - 99 mg/dL   BUN 29 (*) 6 - 23 mg/dL   Creatinine, Ser 4.09 (*) 0.50 - 1.10 mg/dL   Calcium 8.4  8.4 - 81.1 mg/dL   GFR calc non Af Amer 35 (*) >90 mL/min   GFR calc Af Amer 40 (*) >90 mL/min   Comment:            The eGFR has been calculated     using the CKD EPI equation.     This calculation has not been     validated in all clinical     situations.     eGFR's persistently     <90 mL/min signify     possible Chronic Kidney Disease.  CBC     Status: Abnormal   Collection Time    04/01/13 10:00 AM      Result Value Range   WBC 6.8  4.0 - 10.5 K/uL   RBC 2.90 (*) 3.87 - 5.11 MIL/uL   Hemoglobin 9.7 (*) 12.0 - 15.0 g/dL   HCT 91.4 (*) 78.2 - 95.6 %   MCV 96.6  78.0 - 100.0 fL   MCH 33.4  26.0 - 34.0 pg   MCHC 34.6  30.0 - 36.0 g/dL   RDW 21.3  08.6 - 57.8 %   Platelets    150 - 400 K/uL   Value: PLATELET CLUMPS NOTED  ON SMEAR, COUNT APPEARS DECREASED  CULTURE, BLOOD (ROUTINE X 2)     Status: None   Collection Time    04/01/13 10:20 AM      Result Value Range   Specimen Description BLOOD RIGHT HAND     Special Requests BOTTLES DRAWN AEROBIC ONLY 5CC     Culture  Setup Time 04/01/2013 18:28     Culture       Value: GRAM NEGATIVE RODS     Note: Gram Stain Report Called to,Read Back By and Verified With: CHRISTINA AT 0645 04/02/13 BY SNOLO   Report Status PENDING    CULTURE, BLOOD (ROUTINE X 2)     Status: None   Collection Time    04/01/13 10:30 AM      Result Value Range   Specimen Description BLOOD RIGHT HAND      Special Requests BOTTLES DRAWN AEROBIC ONLY 5CC     Culture  Setup Time 04/01/2013 18:27     Culture       Value: GRAM NEGATIVE RODS     Note: Gram Stain Report Called to,Read Back By and Verified With: CHRISTINA AT 0647 04/02/13 BY SNOLO   Report Status PENDING    GLUCOSE, CAPILLARY     Status: Abnormal   Collection Time    04/01/13 11:31 AM      Result Value Range   Glucose-Capillary 101 (*) 70 - 99 mg/dL  GLUCOSE, CAPILLARY     Status: Abnormal   Collection Time    04/01/13  4:34 PM      Result Value Range   Glucose-Capillary 146 (*) 70 - 99 mg/dL  GLUCOSE, CAPILLARY     Status: Abnormal   Collection Time    04/01/13  9:14 PM      Result Value Range   Glucose-Capillary 134 (*) 70 - 99 mg/dL  BASIC METABOLIC PANEL     Status: Abnormal   Collection Time    04/02/13  4:45 AM      Result Value Range   Sodium 135  135 - 145 mEq/L   Potassium 3.9  3.5 - 5.1 mEq/L   Chloride 106  96 - 112 mEq/L   CO2 17 (*) 19 - 32 mEq/L   Glucose, Bld 131 (*) 70 - 99 mg/dL   BUN 37 (*) 6 - 23 mg/dL   Creatinine, Ser 6.21 (*) 0.50 - 1.10 mg/dL   Calcium 7.7 (*) 8.4 - 10.5 mg/dL   GFR calc non Af Amer 28 (*) >90 mL/min   GFR calc Af Amer 33 (*) >90 mL/min   Comment:            The eGFR has been calculated     using the CKD EPI equation.     This calculation has not been     validated in all clinical     situations.     eGFR's persistently     <90 mL/min signify     possible Chronic Kidney Disease.  CBC WITH DIFFERENTIAL     Status: Abnormal   Collection Time    04/02/13  4:45 AM      Result Value Range   WBC 13.0 (*) 4.0 - 10.5 K/uL   RBC 2.37 (*) 3.87 - 5.11 MIL/uL   Hemoglobin 8.0 (*) 12.0 - 15.0 g/dL   Comment: DELTA CHECK NOTED     REPEATED TO VERIFY   HCT 22.8 (*) 36.0 - 46.0 %   MCV 96.2  78.0 - 100.0 fL  MCH 33.8  26.0 - 34.0 pg   MCHC 35.1  30.0 - 36.0 g/dL   RDW 16.1  09.6 - 04.5 %   Platelets 83 (*) 150 - 400 K/uL   Comment: PLATELET COUNT CONFIRMED BY SMEAR      REPEATED TO VERIFY   Neutrophils Relative 85 (*) 43 - 77 %   Neutro Abs 11.1 (*) 1.7 - 7.7 K/uL   Lymphocytes Relative 8 (*) 12 - 46 %   Lymphs Abs 1.1  0.7 - 4.0 K/uL   Monocytes Relative 6  3 - 12 %   Monocytes Absolute 0.8  0.1 - 1.0 K/uL   Eosinophils Relative 0  0 - 5 %   Eosinophils Absolute 0.0  0.0 - 0.7 K/uL   Basophils Relative 0  0 - 1 %   Basophils Absolute 0.0  0.0 - 0.1 K/uL  GLUCOSE, CAPILLARY     Status: Abnormal   Collection Time    04/02/13  7:18 AM      Result Value Range   Glucose-Capillary 126 (*) 70 - 99 mg/dL   Comment 1 Notify RN      Studies/Results: Dg Chest 2 View  04/01/2013  *RADIOLOGY REPORT*  Clinical Data: Tachypnea and fever.  CHEST - 2 VIEW  Comparison: 06/21/2012.  03/30/2013  Findings: Cardiac silhouette is borderline upper limits of normal size. Ectasia and nonaneurysmal calcification of the thoracic aorta are seen.  Two lead transvenous pacemaker is in place with generator on the left.  No consolidation or pleural effusion is evident.  There is slight prominence of reticular interstitial markings on the lateral image.  There is central peribronchial thickening.  There is osteopenic appearance of bones.  IMPRESSION: Borderline cardiac size.  Transvenous pacemaker in place.  Slight prominence of reticular interstitial markings on lateral image with central peribronchial thickening.  No consolidation or pleural effusion is evident.   Original Report Authenticated By: Onalee Hua Call    Medications: Scheduled Meds: . aspirin EC  325 mg Oral Daily  . carbamazepine  400 mg Oral BID  . cefTRIAXone (ROCEPHIN)  IV  1 g Intravenous Q24H  . Chlorhexidine Gluconate Cloth  6 each Topical Q0600  . cholecalciferol  1,000 Units Oral BID  . clopidogrel  75 mg Oral Daily  . ferrous sulfate  325 mg Oral Q breakfast  . heparin  5,000 Units Subcutaneous Q8H  . insulin aspart  0-9 Units Subcutaneous TID WC  . irbesartan  300 mg Oral Daily  . isosorbide mononitrate  60 mg  Oral Daily  . LORazepam  0.25 mg Oral See admin instructions  . metoprolol succinate  50 mg Oral Daily  . mupirocin ointment  1 application Nasal BID  . olopatadine  1 drop Both Eyes Daily  . pantoprazole  40 mg Oral Daily  . senna-docusate  1 tablet Oral BID  . simvastatin  20 mg Oral QHS  . sodium chloride  3 mL Intravenous Q12H  . sodium chloride  3 mL Intravenous Q12H  . tobramycin-dexamethasone  2 drop Both Eyes Q6H   Continuous Infusions: . sodium chloride 70 mL/hr at 04/01/13 0849   PRN Meds:.sodium chloride, acetaminophen, acetaminophen, albuterol, nitroGLYCERIN, polyvinyl alcohol, sodium chloride  Assessment/Plan:  1. Fall: Patient presented following a fall and found to be likely secondary to dehydration and UTI and possible vasovagal episode.   2. Right hip pain: This is an incidental finding on physical exam. X-ray without fracture or acute abnormality - symptomatically improving  3. Altered  mental status: Patient is chronically very forgetful, and has no memory of event. Likely secondary to UTI and also on top of mild dementia chronically  4. Hyponatremia: Sodium level is improving and up to 131. Repeat in a.m. This may in part, be contributory to AMS and possibly, fall. Continue saline infusion. 5. Dehydration: Patient has elevated BUN/creatinine but improving with BUN 37and creatinine 1.63 today. IV fluids increased  6. CAD (coronary artery disease): Appears stable.asymptomatic. Cardiac enzymes negative on cycling  7. HTN (hypertension): Controlled on pre-admission antihypertensives.  8. GERD (gastroesophageal reflux disease): Asymptomatic at this time.  9. Seizure disorder: Will continue pre-admission anticonvulsants except will discontinue Depakote which was for behavior control.. Tegretol level okay yesterday 10. Sinus node dysfunction: Patient is s/p PPM 06/2012. Cardiac monitor reveals SR and occasional paced complexes. As described above, pacer check is okay  11. DM:  This is type 2,and appears reasonably controlled, based on random blood glucose of 136. Will manage with SSI for now, and check HBA1C.  12. Abdominal pain - has resolved  13. Conjunctivitis - clearing nicely with TobraDex 14. Gram-negative bacteremia likely secondary to UTI - responded to Ceftin. We'll add no further antibiotics currently 15. Disposition - plan is for transfer back to facility soon but will need to be more independent with ambulation et Karie Soda. May need skilled nursing facility - should improve quickly with UTI therapy    LOS: 4 days   Pearla Dubonnet, MD 04/02/2013, 8:29 AM

## 2013-04-02 NOTE — Progress Notes (Signed)
Pt was sitting up in chair when I arrived. Her eyes remained closed during our visit and she appeared in and out of sleep. Pt repeatedly said she wants to go home. Pt wanted me to pray for her.  Marjory Lies Chaplain  04/02/13 1400  Clinical Encounter Type  Visited With Patient

## 2013-04-02 NOTE — Progress Notes (Signed)
Pt more lethargic, grips hands equally strong, follow commands at times, mumbles when speaking. Called Dr. Kevan Ny new orders for labs and cipro, will monitor.

## 2013-04-02 NOTE — Progress Notes (Signed)
Noticed that pts hgb was 8 from 9.7 yesterday called Dr. Kevan Ny. New orders recieved are to redraw hgb around 5pm, stop aspirin, leave pt on plavix, get a fecal occult. Will continue to monitor patient.

## 2013-04-02 NOTE — Progress Notes (Signed)
Pt vomited lunch will notify MD

## 2013-04-02 NOTE — Progress Notes (Signed)
Pt had blood cultures drawn 04/01/13 x2 sets--notified by lab that both sets of aerobic bottles were positive for gram negative rods.  Will pass on to next nurse in report and page MD.  Pt has remained afebrile and currently getting scheduled Rocephin for UTI. Dierdre Highman, RN

## 2013-04-02 NOTE — Progress Notes (Signed)
Occupational Therapy Treatment Patient Details Name: Aretha Levi MRN: [id number] DOB: December 23, 1927 Today's Date: 04/02/2013 Time: 7829-5621 OT Time Calculation (min): 25 min  OT Assessment / Plan / Recommendation Comments on Treatment Session Pt with increased lethargy this pm. Pt has demonstrated a significant decline since eval on Monday. Pt will need rehab at SNF prior to returning home at this time. Will continue to follow to facilitate D/C to appropriate venue. Discussed change from this am session to this pm session with nsg    Follow Up Recommendations  Supervision/Assistance - 24 hour;SNF;Other (comment)    Barriers to Discharge       Equipment Recommendations  None recommended by OT    Recommendations for Other Services    Frequency Min 2X/week   Plan Discharge plan remains appropriate    Precautions / Restrictions Precautions Precautions: Fall   Pertinent Vitals/Pain Lethargic. Vitals stable    ADL  ADL Comments: Pt seen second session today to further assess ability and assess with D/C planning. Unable to attend long enough to wash face.    OT Diagnosis:    OT Problem List:   OT Treatment Interventions:     OT Goals Acute Rehab OT Goals OT Goal Formulation: Patient unable to participate in goal setting Time For Goal Achievement: 04/09/13 Potential to Achieve Goals: Good ADL Goals Pt Will Transfer to Toilet: with modified independence;Ambulation;3-in-1;with DME ADL Goal: Toilet Transfer - Progress: Goal set today Pt Will Perform Toileting - Clothing Manipulation: with modified independence;Standing ADL Goal: Toileting - Clothing Manipulation - Progress: Goal set today Pt Will Perform Toileting - Hygiene: with modified independence;Standing at 3-in-1/toilet ADL Goal: Toileting - Hygiene - Progress: Goal set today Additional ADL Goal #1: Mod I with bed mobility for ADL ADL Goal: Additional Goal #1 - Progress: Progressing toward goals Additional ADL Goal #2: mod I  with functional mobility for ADL within room with vc to compensate for decreased vision. ADL Goal: Additional Goal #2 - Progress: Goal set today  Visit Information  Last OT Received On: 04/02/13 Assistance Needed: +2    Subjective Data      Prior Functioning    mod I with mobility. Mod A with ADL due to mild dementia . Lived at ALF   Cognition  Cognition Arousal/Alertness: Lethargic Behavior During Therapy: Flat affect Overall Cognitive Status: Impaired/Different from baseline General Comments: poor mentation. increaed lethargy from earlier session    Mobility  Bed Mobility Bed Mobility: Rolling Left;Left Sidelying to Sit Rolling Left: 2: Max assist Details for Bed Mobility Assistance: Total A with sit - supine.  Transfers Details for Transfer Assistance: unable to pariticpate in transfers this pm. Unable to sit upright on EOB     Exercises      Balance Balance Balance Assessed:  (Max A with static siting this pm)   End of Session OT - End of Session Activity Tolerance: Patient limited by fatigue Patient left: in bed;with call bell/phone within reach;with bed alarm set Nurse Communication: Mobility status;Other (comment) (increased lethargy)  GO     Jolana Runkles,HILLARY 04/02/2013, 4:42 PM [facility], OTR/L  [phone] 04/02/2013

## 2013-04-02 NOTE — Progress Notes (Signed)
CSW received referral for SNF placement. CSW will meet with the pt and/or family to search for placement.  Dorothia Passmore, LCSW-A Clinical Social Worker [phone]   

## 2013-04-02 NOTE — Progress Notes (Signed)
CSW faxed out information for a SNF bed search for [facility]. CSW left a message for the pt's ALF contact person: Britta Mccreedy at [facility] to call Child psychotherapist. CSW also talked with the pt's dtr regarding the need for possible SNF if the pt doesn't get stronger she will need to go to a short term SNF.   Sherald Barge, LCSW-A Clinical Social Worker [phone]

## 2013-04-02 NOTE — Progress Notes (Signed)
Occupational Therapy Evaluation Patient Details Name: [name] [name] MRN: [id number] DOB: 09-19-1928 Today's Date: 04/02/2013 Time: 4098-1191 OT Time Calculation (min): 24 min  OT Assessment / Plan / Recommendation Clinical Impression  79 yo admitted from ALF due to increased confusion. Pt with UTI. Discussed pt with ALF coordinator, Doylene Canning, who states that pt was mod I with mobility @ RW level and min - mod A with ADL due to mild dementia. After discussing pt, coordinator states that pt is not at her baseline and this is a decline for her. In order for pt to return to ALF, she would need to be Mod I with mobility. Will plan to see again this pm to continue to assess. D/C to ALF will be best option as pt is familiar with staff/environment. However, if pt is not able to achieve Mod I level, she will need a SNF. IF pt's confusion clears, she may be appropriate for short term CIR to return her to her PLOF. Will continue to assess.    OT Assessment  Patient needs continued OT Services    Follow Up Recommendations  Supervision/Assistance - 24 hour;SNF;Other (comment) (ALF vs SNF) see above   Barriers to Discharge Decreased caregiver support    Equipment Recommendations  None recommended by OT    Recommendations for Other Services    Frequency  Min 2X/week    Precautions / Restrictions Precautions Precautions: Fall Precaution Comments: poor vision Restrictions Weight Bearing Restrictions: No   Pertinent Vitals/Pain no apparent distress     ADL  Eating/Feeding: Minimal assistance (due to confusion) Grooming: Moderate assistance Upper Body Bathing: Moderate assistance Lower Body Bathing: Moderate assistance Where Assessed - Lower Body Bathing: Supported sit to stand Upper Body Dressing: Moderate assistance Where Assessed - Upper Body Dressing: Supported sitting Lower Body Dressing: Moderate assistance Where Assessed - Lower Body Dressing: Supported sit to Pharmacist, hospital:  +2 Total assistance;Simulated Toilet Transfer: Patient Percentage: 50% Equipment Used: Gait belt Transfers/Ambulation Related to ADLs: +2 for total ADL Comments: Apparently, [facility] where she required    OT Diagnosis: Generalized weakness;Cognitive deficits;Disturbance of vision;Altered mental status  OT Problem List: Decreased strength;Decreased activity tolerance;Impaired balance (sitting and/or standing);Impaired vision/perception;Decreased cognition;Decreased safety awareness;Decreased knowledge of use of DME or AE;Obesity OT Treatment Interventions: Self-care/ADL training;Energy conservation;DME and/or AE instruction;Therapeutic activities;Cognitive remediation/compensation;Visual/perceptual remediation/compensation;Patient/family education;Balance training   OT Goals Acute Rehab OT Goals OT Goal Formulation: Patient unable to participate in goal setting Time For Goal Achievement: 04/09/13 Potential to Achieve Goals: Good ADL Goals Pt Will Transfer to Toilet: with modified independence;Ambulation;3-in-1;with DME ADL Goal: Toilet Transfer - Progress: Goal set today Pt Will Perform Toileting - Clothing Manipulation: with modified independence;Standing ADL Goal: Toileting - Clothing Manipulation - Progress: Goal set today Pt Will Perform Toileting - Hygiene: with modified independence;Standing at 3-in-1/toilet ADL Goal: Toileting - Hygiene - Progress: Goal set today Additional ADL Goal #1: Mod I with bed mobility for ADL ADL Goal: Additional Goal #1 - Progress: Goal set today Additional ADL Goal #2: mod I with functional mobility for ADL within room with vc to compensate for decreased vision. ADL Goal: Additional Goal #2 - Progress: Goal set today  Visit Information  Last OT Received On: 04/02/13 Assistance Needed: +2    Subjective Data      Prior Functioning     Home Living Lives With: Other (Comment) Available Help at  Discharge: Other (Comment);Available 24 hours/day (Assisted Living) Additional Comments: PTA, pt lived in ALF  and required 50% assistance with ADL and was mod I with mobiity @ RW level.. Facility reports that pt had mild dementia and walked whereever she wanted to go at facilty.  Prior Function Level of Independence: Needs assistance Needs Assistance: Bathing;Dressing Bath: Moderate Dressing: Moderate Communication Communication: No difficulties Dominant Hand: Right         Vision/Perception Vision - History Baseline Vision: Other (comment) Visual History:  (poor vision)   Cognition  Cognition Arousal/Alertness: Awake/alert Behavior During Therapy: Restless Overall Cognitive Status: Impaired/Different from baseline Area of Impairment: Orientation;Attention;Memory;Following commands;Awareness;Problem solving Orientation Level: Disoriented to;Place;Time;Situation Current Attention Level: Focused Memory: Decreased recall of precautions Following Commands: Follows one step commands inconsistently Awareness: Intellectual Problem Solving: Slow processing;Requires verbal cues;Requires tactile cues General Comments: Monday, pt was alert and oriented. History of mild dementia. Today, pt confused and does not know where she is.     Extremity/Trunk Assessment Right Upper Extremity Assessment RUE ROM/Strength/Tone:  (general weakness) Left Upper Extremity Assessment LUE ROM/Strength/Tone:  (general weakness) Trunk Assessment Trunk Assessment: Normal     Mobility Transfers Transfers: Sit to Stand;Stand to Sit Sit to Stand: From bed Sit to Stand: Patient Percentage: 60% Stand to Sit: To chair/3-in-1 Stand to Sit: Patient Percentage: 60% Details for Transfer Assistance: tactile facilitation     Exercise     Balance  Min A static sitting EOB   End of Session OT - End of Session Equipment Utilized During Treatment: Gait belt Activity Tolerance: Other (comment) (limited due to  apparent confusion) Patient left: in chair;with call bell/phone within reach Nurse Communication: Mobility status;Other (comment) (current functional status)  GO     Jamielee Mchale,HILLARY 04/02/2013, 10:58 AM Luisa Dago, OTR/L  [phone] 04/02/2013

## 2013-04-02 NOTE — Progress Notes (Signed)
Physical Therapy Treatment Patient Details Name: [name] [name] MRN: [id number] DOB: 1928-07-12 Today's Date: 04/02/2013 Time: 1478-2956 PT Time Calculation (min): 28 min  PT Assessment / Plan / Recommendation Comments on Treatment Session  Pt making slow progress toward goals today with incr activity and less assistance needed with overall mobility.     Follow Up Recommendations  SNF;Supervision/Assistance - 24 hour           Equipment Recommendations  None recommended by PT       Frequency Min 2X/week   Plan Discharge plan remains appropriate;Frequency remains appropriate    Precautions / Restrictions Precautions Precautions: Fall Precaution Comments: poor vision Restrictions Weight Bearing Restrictions: No       Mobility  Bed Mobility Supine to Sit: 4: Min assist;With rails;HOB elevated Supine to Sit: Patient Percentage: 80% Sitting - Scoot to Edge of Bed: 3: Mod assist Sitting - Scoot to Edge of Bed: Patient Percentage: 70% Details for Bed Mobility Assistance: Pt needed mod directional cues for techique and sequence with sitting up to edge of bed. Assist needed to elevate trunk into seated position.  Min assist to reciprocally scoot to edge of bed with pad assist to facilitated wt shifting.         Transfers Sit to Stand: 1: +2 Total assist;From bed;With upper extremity assist Sit to Stand: Patient Percentage: 60% Stand to Sit: 1: +2 Total assist;To chair/3-in-1;With armrests Stand to Sit: Patient Percentage: 60% Stand Pivot Transfers: 1: +2 Total assist Stand Pivot Transfers: Patient Percentage: 50% Details for Transfer Assistance: assist to ant wt shift to stand up and cues for technique with standing. pt needed cues and facilitation for upright posture (hip/knee extension) and directional cues for transfer bed to chair with bil HHA.     Ambulation/Gait Ambulation/Gait Assistance: Not tested (comment)    Exercises General Exercises - Lower Extremity Ankle  Circles/Pumps: AAROM;Both;10 reps;Supine Quad Sets: AAROM;Strengthening;Supine;Both;10 reps Heel Slides: AAROM;Strengthening;Both;5 reps;Supine    PT Goals Acute Rehab PT Goals Pt will go Supine/Side to Sit: with rail;with min assist PT Goal: Supine/Side to Sit - Progress: Progressing toward goal Pt will Sit at [facility] of Bed: with modified independence;6-10 min;with bilateral upper extremity support PT Goal: Sit at Edge Of Bed - Progress: Progressing toward goal Pt will go Sit to Stand: with supervision;with upper extremity assist PT Goal: Sit to Stand - Progress: Progressing toward goal Pt will Transfer Bed to Chair/Chair to Bed: with min assist PT Transfer Goal: Bed to Chair/Chair to Bed - Progress: Progressing toward goal  Visit Information  Last PT Received On: 04/02/13 Assistance Needed: +2    Subjective Data  Subjective: No new complaints, answering questions appropriately </= 30 % of time.   Cognition  Cognition Arousal/Alertness: Awake/alert Behavior During Therapy: Restless Overall Cognitive Status: Impaired/Different from baseline Area of Impairment: Orientation;Attention;Memory;Following commands;Awareness;Problem solving Orientation Level: Disoriented to;Place;Time;Situation Current Attention Level: Focused Memory: Decreased recall of precautions Following Commands: Follows one step commands inconsistently Awareness: Intellectual Problem Solving: Slow processing;Requires verbal cues;Requires tactile cues General Comments: Monday, pt was alert and oriented. History of mild dementia. Today, pt confused and does not know where she is.        End of Session PT - End of Session Equipment Utilized During Treatment: Gait belt Activity Tolerance: Patient tolerated treatment well;Patient limited by fatigue Patient left: in chair;with call bell/phone within reach Nurse Communication: Mobility status   GP     Sallyanne Kuster 04/02/2013, 1:04 PM  Sallyanne Kuster, PTA Office-  [phone]

## 2013-04-02 NOTE — Progress Notes (Signed)
ANTIBIOTIC CONSULT NOTE - INITIAL  Pharmacy Consult for Cipro Indication: UTI  No Known Allergies  Patient Measurements: Height: 5\' 5"  (165.1 cm) Weight: 225 lb (102.059 kg) IBW/kg (Calculated) : 57 Adjusted Body Weight: n/a  Vital Signs: Temp: 98.2 F (36.8 C) (04/30 1200) Temp src: Oral (04/30 1200) BP: 121/72 mmHg (04/30 1200) Pulse Rate: 64 (04/30 1200) Intake/Output from previous day: 04/29 0701 - 04/30 0700 In: 956.2 [P.O.:5; I.V.:951.2] Out: -  Intake/Output from this shift: Total I/O In: 480 [P.O.:480] Out: -   Labs:  Recent Labs  03/31/13 0536 04/01/13 0655 04/01/13 1000 04/02/13 0445  WBC 7.5  --  6.8 13.0*  HGB 9.0*  --  9.7* 8.0*  PLT 127*  --  PLATELET CLUMPS NOTED ON SMEAR, COUNT APPEARS DECREASED 83*  CREATININE 0.96 1.25* 1.38* 1.63*   Estimated Creatinine Clearance: 31.5 ml/min (by C-G formula based on Cr of 1.63). No results found for this basename: VANCOTROUGH, Leodis Binet, VANCORANDOM, GENTTROUGH, GENTPEAK, GENTRANDOM, TOBRATROUGH, TOBRAPEAK, TOBRARND, AMIKACINPEAK, AMIKACINTROU, AMIKACIN,  in the last 72 hours   Microbiology: Recent Results (from the past 720 hour(s))  MRSA PCR SCREENING     Status: Abnormal   Collection Time    03/30/13  9:08 AM      Result Value Range Status   MRSA by PCR POSITIVE (*) NEGATIVE Final   Comment:            The GeneXpert MRSA Assay (FDA     approved for NASAL specimens     only), is one component of a     comprehensive MRSA colonization     surveillance program. It is not     intended to diagnose MRSA     infection nor to guide or     monitor treatment for     MRSA infections.     RESULT CALLED TO, READ BACK BY AND VERIFIED WITH:     MILFORD,J RN 03/30/13 1347 WOOTEN,K  CULTURE, BLOOD (ROUTINE X 2)     Status: None   Collection Time    04/01/13 10:20 AM      Result Value Range Status   Specimen Description BLOOD RIGHT HAND   Final   Special Requests BOTTLES DRAWN AEROBIC ONLY 5CC   Final   Culture   Setup Time 04/01/2013 18:28   Final   Culture     Final   Value: GRAM NEGATIVE RODS     Note: Gram Stain Report Called to,Read Back By and Verified With: CHRISTINA AT 0645 04/02/13 BY SNOLO   Report Status PENDING   Incomplete  CULTURE, BLOOD (ROUTINE X 2)     Status: None   Collection Time    04/01/13 10:30 AM      Result Value Range Status   Specimen Description BLOOD RIGHT HAND   Final   Special Requests BOTTLES DRAWN AEROBIC ONLY 5CC   Final   Culture  Setup Time 04/01/2013 18:27   Final   Culture     Final   Value: GRAM NEGATIVE RODS     Note: Gram Stain Report Called to,Read Back By and Verified With: [facility] AT 0647 04/02/13 BY SNOLO   Report Status PENDING   Incomplete    Medical History: Past Medical History  Diagnosis Date  . Hypertension   . Acid reflux   . Seizures   . Coronary artery disease 2003    60-75%D2, 50% D1, 25% left circ, 20% LAD, normal LVF  . Dyslipidemia   . LBBB (left  bundle branch block)   . Confusion   . Pacemaker   . Fall at nursing home 03/29/2013  . Type II diabetes mellitus   . Myocardial infarction     "long time ago" (03/31/2013)  . Exertional shortness of breath     "once in awhile" (03/31/2013)  . Stroke     dennies residual 03/31/2013  . Anxiety     Medications:  Scheduled:  . [COMPLETED] albuterol      . carbamazepine  400 mg Oral BID  . cefTRIAXone (ROCEPHIN)  IV  2 g Intravenous Q24H  . Chlorhexidine Gluconate Cloth  6 each Topical Q0600  . cholecalciferol  1,000 Units Oral BID  . ciprofloxacin  200 mg Intravenous Q12H  . clopidogrel  75 mg Oral Daily  . ferrous sulfate  325 mg Oral Q breakfast  . heparin  5,000 Units Subcutaneous Q8H  . insulin aspart  0-9 Units Subcutaneous TID WC  . irbesartan  300 mg Oral Daily  . isosorbide mononitrate  60 mg Oral Daily  . LORazepam  0.25 mg Oral See admin instructions  . metoprolol succinate  50 mg Oral Daily  . mupirocin ointment  1 application Nasal BID  . olopatadine  1 drop Both  Eyes Daily  . pantoprazole  40 mg Oral Daily  . senna-docusate  1 tablet Oral BID  . simvastatin  20 mg Oral QHS  . sodium chloride  3 mL Intravenous Q12H  . sodium chloride  3 mL Intravenous Q12H  . tobramycin-dexamethasone  2 drop Both Eyes Q6H  . [DISCONTINUED] aspirin EC  325 mg Oral Daily  . [DISCONTINUED] cefTRIAXone (ROCEPHIN)  IV  1 g Intravenous Q24H   Assessment: 79 yo female admitted with fall and AMS.  Now noted to have UTI.  Blood cultures growing GNR in 2/2 cultures.  Urine culture still pending.  Pharmacy asked to add empiric Cipro.  Also on ceftriaxone.  Estimated CrCl is borderline (~ 31 ml/min) for reduced Cipro dosing, but given 2/2 cultures positive, will dose aggressively for now.    Goal of Therapy:  Resolution of infection  Plan:  1. Cipro 400 mg IV q 12 hrs. 2. Closely monitor renal function and adjust dosing as needed. 3. F/U cultures and sensitivities.  Tad Moore, BCPS  Clinical Pharmacist Pager [phone]  04/02/2013 3:59 PM

## 2013-04-03 DIAGNOSIS — R7881 Bacteremia: Secondary | ICD-10-CM | POA: Clinically undetermined

## 2013-04-03 DIAGNOSIS — F0150 Vascular dementia without behavioral disturbance: Secondary | ICD-10-CM | POA: Diagnosis present

## 2013-04-03 DIAGNOSIS — G9341 Metabolic encephalopathy: Secondary | ICD-10-CM | POA: Diagnosis present

## 2013-04-03 LAB — URINE CULTURE: Colony Count: >=100000

## 2013-04-03 LAB — BASIC METABOLIC PANEL
BUN: 46 mg/dL — ABNORMAL HIGH (ref 6–23)
CO2: 18 mEq/L — ABNORMAL LOW (ref 19–32)
Chloride: 106 mEq/L (ref 96–112)
GFR calc Af Amer: 41 mL/min — ABNORMAL LOW (ref >90)
Glucose, Bld: 155 mg/dL — ABNORMAL HIGH (ref 70–99)
Potassium: 3.6 mEq/L (ref 3.5–5.1)

## 2013-04-03 LAB — CBC WITH DIFFERENTIAL/PLATELET
Hemoglobin: 7.6 g/dL — ABNORMAL LOW (ref 12.0–15.0)
Lymphocytes Relative: 6 % — ABNORMAL LOW (ref 12–46)
Lymphs Abs: 0.6 10*3/uL — ABNORMAL LOW (ref 0.7–4.0)
Monocytes Relative: 5 % (ref 3–12)
Neutro Abs: 9 10*3/uL — ABNORMAL HIGH (ref 1.7–7.7)
Neutrophils Relative %: 89 % — ABNORMAL HIGH (ref 43–77)
RBC: 2.29 MIL/uL — ABNORMAL LOW (ref 3.87–5.11)

## 2013-04-03 LAB — PREPARE RBC (CROSSMATCH)

## 2013-04-03 MED ORDER — DEXTROSE 5 % IV SOLN
2.0000 g | INTRAVENOUS | Status: DC
  Administered 2013-04-03: 2 g via INTRAVENOUS
  Filled 2013-04-03 (×2): qty 2

## 2013-04-03 NOTE — Progress Notes (Signed)
Pt asleep and daughter requesting to speak with patient. Phone given to patient. Patient had slurred speech and unable to understand. Pt would follow commands intermittently. Pts daughter tells RN this is not normal speech for the patient. Rapid response called to evaluate and MD made aware of situation. Vital signs stable see documentation. New orders received. Will carry out and continue to monitor.

## 2013-04-03 NOTE — Progress Notes (Signed)
Called by bedside RN regarding change in speech. Per patient's daughter on phone and bedside RN, patient had been increasingly lethargic throughout day. Patient's daughter alerted nurse that after a nap the patient had trouble finding words and mumbled words. MD notified by bedside RN. Assisted bedside RN with stat head CT without contrast. VSS. Will continue to monitor, results of head CT called to MD by bedside RN. Advised bedside RN to call with further concerns.

## 2013-04-03 NOTE — Progress Notes (Signed)
Abx consult:  Pt with ecoli bacteremia from urosepsis. Sensitivity is pending. Rocephin was changed to cipro this AM. E.coli has a 25% resistant rate here, discussed with dr. Kevan Ny about the case. Will change back to rocephin until sensitivity is back.

## 2013-04-03 NOTE — Progress Notes (Signed)
Subjective: [name] [name] is slowly becoming more alert.  She was found to have gram-negative bacteremia on 2 blood cultures 2 days ago.  She has an Escherichia coli urinary tract infection.  Still azotemic and dehydrated based on physical exam and laboratory parameters.  Will continue IV hydration.  IV Rocephin was switched to IV Cipro.  Arousable this a.m. and conversant but still confused.  She does have multi-infarct dementia.  No seizure activity.  MAXIMUM TEMPERATURE over the past 12 hours is 99.1  Objective: Weight change: 2.359 kg (5 lb 3.2 oz)  Intake/Output Summary (Last 24 hours) at 04/03/13 1610 Last data filed at 04/02/13 2258  Gross per 24 hour  Intake    483 ml  Output      0 ml  Net    483 ml   Filed Vitals:   04/02/13 2050 04/02/13 2143 04/03/13 0625 04/03/13 0814  BP: 122/63 122/37 124/74 142/66  Pulse: 65 62 68 63  Temp: 99.1 F (37.3 C) 98.9 F (37.2 C) 98.9 F (37.2 C) 99.3 F (37.4 C)  TempSrc: Axillary Oral Oral Oral  Resp: 23 20 20 18   Height:      Weight:   104.418 kg (230 lb 3.2 oz)   SpO2: 98% 100% 98% 96%   General Appearance: Somnolent but arousable, cooperative, no distress, appears stated age, eyes are clearing daily with less exudate. She is  conversive today and recognizes me readily  HEENT: Eyes are clearing nicely, no exudate  Lungs: Clear to auscultation bilaterally, respirations unlabored  Heart: Regular rate and rhythm, S1 and S2 normal, no murmur, rub or gallop  Abdomen: Soft, non-tender, bowel sounds active all four quadrants, no masses, no organomegaly  Extremities: Extremities normal, atraumatic, no cyanosis or edema  Neuro: No focal neurologic deficit. Moves all extremities   Lab Results: Results for orders placed during the hospital encounter of 03/29/13 (from the past 48 hour(s))  URINALYSIS, ROUTINE W REFLEX MICROSCOPIC     Status: Abnormal   Collection Time    04/01/13  8:49 AM      Result Value Range   Color, Urine AMBER (*)  YELLOW   Comment: BIOCHEMICALS MAY BE AFFECTED BY COLOR   APPearance TURBID (*) CLEAR   Specific Gravity, Urine 1.026  1.005 - 1.030   pH 5.0  5.0 - 8.0   Glucose, UA NEGATIVE  NEGATIVE mg/dL   Hgb urine dipstick LARGE (*) NEGATIVE   Bilirubin Urine LARGE (*) NEGATIVE   Ketones, ur 15 (*) NEGATIVE mg/dL   Protein, ur 960 (*) NEGATIVE mg/dL   Urobilinogen, UA 2.0 (*) 0.0 - 1.0 mg/dL   Nitrite POSITIVE (*) NEGATIVE   Leukocytes, UA LARGE (*) NEGATIVE  URINE CULTURE     Status: None   Collection Time    04/01/13  8:49 AM      Result Value Range   Specimen Description URINE, CATHETERIZED     Special Requests NONE     Culture  Setup Time 04/01/2013 09:49     Colony Count >=100,000 COLONIES/ML     Culture ESCHERICHIA COLI     Report Status PENDING    URINE MICROSCOPIC-ADD ON     Status: Abnormal   Collection Time    04/01/13  8:49 AM      Result Value Range   Squamous Epithelial / LPF FEW (*) RARE   WBC, UA TOO NUMEROUS TO COUNT  <3 WBC/hpf   RBC / HPF 0-2  <3 RBC/hpf   Bacteria, UA  FEW (*) RARE  CARBAMAZEPINE LEVEL, TOTAL     Status: None   Collection Time    04/01/13 10:00 AM      Result Value Range   Carbamazepine Lvl 9.6  4.0 - 12.0 ug/mL  BASIC METABOLIC PANEL     Status: Abnormal   Collection Time    04/01/13 10:00 AM      Result Value Range   Sodium 132 (*) 135 - 145 mEq/L   Potassium 3.5  3.5 - 5.1 mEq/L   Chloride 100  96 - 112 mEq/L   CO2 19  19 - 32 mEq/L   Glucose, Bld 109 (*) 70 - 99 mg/dL   BUN 29 (*) 6 - 23 mg/dL   Creatinine, Ser 1.61 (*) 0.50 - 1.10 mg/dL   Calcium 8.4  8.4 - 09.6 mg/dL   GFR calc non Af Amer 35 (*) >90 mL/min   GFR calc Af Amer 40 (*) >90 mL/min   Comment:            The eGFR has been calculated     using the CKD EPI equation.     This calculation has not been     validated in all clinical     situations.     eGFR's persistently     <90 mL/min signify     possible Chronic Kidney Disease.  CBC     Status: Abnormal   Collection  Time    04/01/13 10:00 AM      Result Value Range   WBC 6.8  4.0 - 10.5 K/uL   RBC 2.90 (*) 3.87 - 5.11 MIL/uL   Hemoglobin 9.7 (*) 12.0 - 15.0 g/dL   HCT 04.5 (*) 40.9 - 81.1 %   MCV 96.6  78.0 - 100.0 fL   MCH 33.4  26.0 - 34.0 pg   MCHC 34.6  30.0 - 36.0 g/dL   RDW 91.4  78.2 - 95.6 %   Platelets    150 - 400 K/uL   Value: PLATELET CLUMPS NOTED ON SMEAR, COUNT APPEARS DECREASED  CULTURE, BLOOD (ROUTINE X 2)     Status: None   Collection Time    04/01/13 10:20 AM      Result Value Range   Specimen Description BLOOD RIGHT HAND     Special Requests BOTTLES DRAWN AEROBIC ONLY 5CC     Culture  Setup Time 04/01/2013 18:28     Culture       Value: GRAM NEGATIVE RODS     Note: Gram Stain Report Called to,Read Back By and Verified With: CHRISTINA AT 0645 04/02/13 BY SNOLO   Report Status PENDING    CULTURE, BLOOD (ROUTINE X 2)     Status: None   Collection Time    04/01/13 10:30 AM      Result Value Range   Specimen Description BLOOD RIGHT HAND     Special Requests BOTTLES DRAWN AEROBIC ONLY 5CC     Culture  Setup Time 04/01/2013 18:27     Culture       Value: GRAM NEGATIVE RODS     Note: Gram Stain Report Called to,Read Back By and Verified With: CHRISTINA AT 0647 04/02/13 BY SNOLO   Report Status PENDING    GLUCOSE, CAPILLARY     Status: Abnormal   Collection Time    04/01/13 11:31 AM      Result Value Range   Glucose-Capillary 101 (*) 70 - 99 mg/dL  GLUCOSE, CAPILLARY  Status: Abnormal   Collection Time    04/01/13  4:34 PM      Result Value Range   Glucose-Capillary 146 (*) 70 - 99 mg/dL  GLUCOSE, CAPILLARY     Status: Abnormal   Collection Time    04/01/13  9:14 PM      Result Value Range   Glucose-Capillary 134 (*) 70 - 99 mg/dL  BASIC METABOLIC PANEL     Status: Abnormal   Collection Time    04/02/13  4:45 AM      Result Value Range   Sodium 135  135 - 145 mEq/L   Potassium 3.9  3.5 - 5.1 mEq/L   Chloride 106  96 - 112 mEq/L   CO2 17 (*) 19 - 32 mEq/L    Glucose, Bld 131 (*) 70 - 99 mg/dL   BUN 37 (*) 6 - 23 mg/dL   Creatinine, Ser 1.19 (*) 0.50 - 1.10 mg/dL   Calcium 7.7 (*) 8.4 - 10.5 mg/dL   GFR calc non Af Amer 28 (*) >90 mL/min   GFR calc Af Amer 33 (*) >90 mL/min   Comment:            The eGFR has been calculated     using the CKD EPI equation.     This calculation has not been     validated in all clinical     situations.     eGFR's persistently     <90 mL/min signify     possible Chronic Kidney Disease.  CBC WITH DIFFERENTIAL     Status: Abnormal   Collection Time    04/02/13  4:45 AM      Result Value Range   WBC 13.0 (*) 4.0 - 10.5 K/uL   RBC 2.37 (*) 3.87 - 5.11 MIL/uL   Hemoglobin 8.0 (*) 12.0 - 15.0 g/dL   Comment: DELTA CHECK NOTED     REPEATED TO VERIFY   HCT 22.8 (*) 36.0 - 46.0 %   MCV 96.2  78.0 - 100.0 fL   MCH 33.8  26.0 - 34.0 pg   MCHC 35.1  30.0 - 36.0 g/dL   RDW 14.7  82.9 - 56.2 %   Platelets 83 (*) 150 - 400 K/uL   Comment: PLATELET COUNT CONFIRMED BY SMEAR     REPEATED TO VERIFY   Neutrophils Relative 85 (*) 43 - 77 %   Neutro Abs 11.1 (*) 1.7 - 7.7 K/uL   Lymphocytes Relative 8 (*) 12 - 46 %   Lymphs Abs 1.1  0.7 - 4.0 K/uL   Monocytes Relative 6  3 - 12 %   Monocytes Absolute 0.8  0.1 - 1.0 K/uL   Eosinophils Relative 0  0 - 5 %   Eosinophils Absolute 0.0  0.0 - 0.7 K/uL   Basophils Relative 0  0 - 1 %   Basophils Absolute 0.0  0.0 - 0.1 K/uL  GLUCOSE, CAPILLARY     Status: Abnormal   Collection Time    04/02/13  7:18 AM      Result Value Range   Glucose-Capillary 126 (*) 70 - 99 mg/dL   Comment 1 Notify RN    GLUCOSE, CAPILLARY     Status: Abnormal   Collection Time    04/02/13 11:48 AM      Result Value Range   Glucose-Capillary 144 (*) 70 - 99 mg/dL   Comment 1 Notify RN    OCCULT BLOOD X 1 CARD TO LAB, STOOL  Status: None   Collection Time    04/02/13  3:29 PM      Result Value Range   Fecal Occult Bld NEGATIVE  NEGATIVE  GLUCOSE, CAPILLARY     Status: Abnormal    Collection Time    04/02/13  4:27 PM      Result Value Range   Glucose-Capillary 199 (*) 70 - 99 mg/dL   Comment 1 Notify RN    CBC WITH DIFFERENTIAL     Status: Abnormal   Collection Time    04/02/13  5:37 PM      Result Value Range   WBC 12.3 (*) 4.0 - 10.5 K/uL   RBC 2.45 (*) 3.87 - 5.11 MIL/uL   Hemoglobin 8.3 (*) 12.0 - 15.0 g/dL   HCT 09.8 (*) 11.9 - 14.7 %   MCV 94.7  78.0 - 100.0 fL   MCH 33.9  26.0 - 34.0 pg   MCHC 35.8  30.0 - 36.0 g/dL   RDW 82.9  56.2 - 13.0 %   Platelets 90 (*) 150 - 400 K/uL   Comment: REPEATED TO VERIFY     SPECIMEN CHECKED FOR CLOTS     CONSISTENT WITH PREVIOUS RESULT   Neutrophils Relative 87 (*) 43 - 77 %   Neutro Abs 10.7 (*) 1.7 - 7.7 K/uL   Lymphocytes Relative 7 (*) 12 - 46 %   Lymphs Abs 0.8  0.7 - 4.0 K/uL   Monocytes Relative 6  3 - 12 %   Monocytes Absolute 0.7  0.1 - 1.0 K/uL   Eosinophils Relative 0  0 - 5 %   Eosinophils Absolute 0.0  0.0 - 0.7 K/uL   Basophils Relative 0  0 - 1 %   Basophils Absolute 0.0  0.0 - 0.1 K/uL  GLUCOSE, CAPILLARY     Status: Abnormal   Collection Time    04/02/13 10:36 PM      Result Value Range   Glucose-Capillary 137 (*) 70 - 99 mg/dL  CBC WITH DIFFERENTIAL     Status: Abnormal   Collection Time    04/03/13  5:55 AM      Result Value Range   WBC 10.1  4.0 - 10.5 K/uL   RBC 2.29 (*) 3.87 - 5.11 MIL/uL   Hemoglobin 7.6 (*) 12.0 - 15.0 g/dL   HCT 86.5 (*) 78.4 - 69.6 %   MCV 95.2  78.0 - 100.0 fL   MCH 33.2  26.0 - 34.0 pg   MCHC 34.9  30.0 - 36.0 g/dL   RDW 29.5  28.4 - 13.2 %   Platelets 96 (*) 150 - 400 K/uL   Comment: CONSISTENT WITH PREVIOUS RESULT   Neutrophils Relative 89 (*) 43 - 77 %   Neutro Abs 9.0 (*) 1.7 - 7.7 K/uL   Lymphocytes Relative 6 (*) 12 - 46 %   Lymphs Abs 0.6 (*) 0.7 - 4.0 K/uL   Monocytes Relative 5  3 - 12 %   Monocytes Absolute 0.5  0.1 - 1.0 K/uL   Eosinophils Relative 0  0 - 5 %   Eosinophils Absolute 0.0  0.0 - 0.7 K/uL   Basophils Relative 0  0 - 1 %    Basophils Absolute 0.0  0.0 - 0.1 K/uL  BASIC METABOLIC PANEL     Status: Abnormal   Collection Time    04/03/13  5:55 AM      Result Value Range   Sodium 136  135 - 145 mEq/L  Potassium 3.6  3.5 - 5.1 mEq/L   Chloride 106  96 - 112 mEq/L   CO2 18 (*) 19 - 32 mEq/L   Glucose, Bld 155 (*) 70 - 99 mg/dL   BUN 46 (*) 6 - 23 mg/dL   Creatinine, Ser 4.09 (*) 0.50 - 1.10 mg/dL   Calcium 7.9 (*) 8.4 - 10.5 mg/dL   GFR calc non Af Amer 35 (*) >90 mL/min   GFR calc Af Amer 41 (*) >90 mL/min   Comment:            The eGFR has been calculated     using the CKD EPI equation.     This calculation has not been     validated in all clinical     situations.     eGFR's persistently     <90 mL/min signify     possible Chronic Kidney Disease.  GLUCOSE, CAPILLARY     Status: Abnormal   Collection Time    04/03/13  7:39 AM      Result Value Range   Glucose-Capillary 147 (*) 70 - 99 mg/dL    Studies/Results: Dg Chest 2 View  04/01/2013  *RADIOLOGY REPORT*  Clinical Data: Tachypnea and fever.  CHEST - 2 VIEW  Comparison: 06/21/2012.  03/30/2013  Findings: Cardiac silhouette is borderline upper limits of normal size. Ectasia and nonaneurysmal calcification of the thoracic aorta are seen.  Two lead transvenous pacemaker is in place with generator on the left.  No consolidation or pleural effusion is evident.  There is slight prominence of reticular interstitial markings on the lateral image.  There is central peribronchial thickening.  There is osteopenic appearance of bones.  IMPRESSION: Borderline cardiac size.  Transvenous pacemaker in place.  Slight prominence of reticular interstitial markings on lateral image with central peribronchial thickening.  No consolidation or pleural effusion is evident.   Original Report Authenticated By: Onalee Hua Call    Ct Head Wo Contrast  04/02/2013  *RADIOLOGY REPORT*  Clinical Data: Speech changes.  Fall.  Urinary tract infection. Fever 2 days ago.  CT HEAD WITHOUT  CONTRAST  Technique:  Contiguous axial images were obtained from the base of the skull through the vertex without contrast.  Comparison: 03/30/2013  Findings: The whole areas of encephalomalacia in the right posterior parietal and left posterior temporal and occipital regions consistent with old infarcts.  Diffuse cerebral atrophy. No mass effect or midline shift.  No abnormal extra-axial fluid collections.  Gray-white matter junctions are distinct.  Basal cisterns are not effaced.  Ventricular dilatation consistent with combination of central atrophy and negative mass effect due to infarcts.  No effacement basal cisterns.  No acute intracranial hemorrhage.  Old cerebellar infarcts bilaterally.  Stable appearance of intracranial contents since previous study. Retention cyst in the right maxillary antrum.  Postoperative change in the right mastoid air cells.  No depressed skull fractures.  IMPRESSION: No acute intracranial abnormalities.  Stable appearance of multi focal infarcts since previous study.  Chronic atrophy and small vessel ischemic changes.   Original Report Authenticated By: Burman Nieves, M.D.    Medications: Scheduled Meds: . carbamazepine  400 mg Oral BID  . Chlorhexidine Gluconate Cloth  6 each Topical Q0600  . cholecalciferol  1,000 Units Oral BID  . ciprofloxacin  400 mg Intravenous Q12H  . clopidogrel  75 mg Oral Daily  . ferrous sulfate  325 mg Oral Q breakfast  . insulin aspart  0-9 Units Subcutaneous TID WC  . irbesartan  300 mg Oral Daily  . isosorbide mononitrate  60 mg Oral Daily  . metoprolol succinate  50 mg Oral Daily  . mupirocin ointment  1 application Nasal BID  . olopatadine  1 drop Both Eyes Daily  . pantoprazole  40 mg Oral Daily  . senna-docusate  1 tablet Oral BID  . simvastatin  20 mg Oral QHS  . sodium chloride  3 mL Intravenous Q12H  . sodium chloride  3 mL Intravenous Q12H  . tobramycin-dexamethasone  2 drop Both Eyes Q6H   Continuous Infusions: .  sodium chloride 60 mL/hr at 04/02/13 1019   PRN Meds:.sodium chloride, acetaminophen, acetaminophen, albuterol, nitroGLYCERIN, ondansetron, ondansetron, polyvinyl alcohol, sodium chloride  Assessment/Plan: 1. Fall: Patient presented following a fall and found to be likely secondary to dehydration and UTI and possible vasovagal episode.  2. Right hip pain: This is an incidental finding on physical exam. X-ray without fracture or acute abnormality - symptomatically improving  3. Altered mental status: Patient is chronically very forgetful, and has no memory of event. Likely secondary to UTI and also on top of mild dementia chronically  4. Hyponatremia: Sodium level is improving and up to 136 from 131 yesterday. Repeat in a.m. This may in part, be contributory to AMS and possibly, fall. Continue saline infusion.  5. Dehydration: Patient has elevated BUN/creatinine but improving with BUN 46 and creatinine 1.35 today, down to 1.63. IV fluids continued for now 6. CAD (coronary artery disease): Appears stable.asymptomatic. Cardiac enzymes negative on cycling  7. HTN (hypertension): Controlled on pre-admission antihypertensives.  8. GERD (gastroesophageal reflux disease): Asymptomatic at this time.  9. Seizure disorder: Will continue pre-admission anticonvulsants and Depakote was discontinued which was for behavior control. Tegretol level okay yesterday  10. Sinus node dysfunction: Patient is s/p PPM 06/2012. Cardiac monitor reveals SR and occasional paced complexes. As described above, pacer check is okay  11. DM: This is type 2,and appears reasonably controlled, based on random blood glucose of 136. Will manage with SSI for now, and check HBA1C.  12. Abdominal pain - has resolved  13. Conjunctivitis - clearing nicely with TobraDex  14. Gram-negative bacteremia likely secondary to UTI - have changed from IV Rocephin to IV Cipro. We'll add no further antibiotics currently.  Sensitivities pending 15.   Septic encephalopathy - seems to be improving gradually 16.  Multi-infarct dementia - multiple infarcts in the past resulting in cognitive dysfunction/dementia - CT scan yesterday revealed no new infarcts 15. Disposition - plan is for skilled nursing facility placement early next week   LOS: 5 days   Pearla Dubonnet, MD 04/03/2013, 8:22 AM

## 2013-04-03 NOTE — Progress Notes (Signed)
MD made aware of CT head results. No new orders received will continue to monitor.

## 2013-04-04 LAB — BASIC METABOLIC PANEL
CO2: 18 mEq/L — ABNORMAL LOW (ref 19–32)
Chloride: 106 mEq/L (ref 96–112)
Glucose, Bld: 151 mg/dL — ABNORMAL HIGH (ref 70–99)
Potassium: 3.6 mEq/L (ref 3.5–5.1)
Sodium: 136 mEq/L (ref 135–145)

## 2013-04-04 LAB — CULTURE, BLOOD (ROUTINE X 2)

## 2013-04-04 LAB — CBC WITH DIFFERENTIAL/PLATELET
Eosinophils Absolute: 0 10*3/uL (ref 0.0–0.7)
HCT: 29.3 % — ABNORMAL LOW (ref 36.0–46.0)
Hemoglobin: 10.4 g/dL — ABNORMAL LOW (ref 12.0–15.0)
Lymphs Abs: 1.1 10*3/uL (ref 0.7–4.0)
MCH: 32 pg (ref 26.0–34.0)
MCV: 90.2 fL (ref 78.0–100.0)
Monocytes Relative: 9 % (ref 3–12)
Neutrophils Relative %: 80 % — ABNORMAL HIGH (ref 43–77)
RBC: 3.25 MIL/uL — ABNORMAL LOW (ref 3.87–5.11)

## 2013-04-04 LAB — TYPE AND SCREEN: Unit division: 0

## 2013-04-04 LAB — GLUCOSE, CAPILLARY: Glucose-Capillary: 138 mg/dL — ABNORMAL HIGH (ref 70–99)

## 2013-04-04 MED ORDER — DEXTROSE 5 % IV SOLN
1.0000 g | INTRAVENOUS | Status: DC
  Administered 2013-04-04 – 2013-04-05 (×2): 1 g via INTRAVENOUS
  Filled 2013-04-04 (×3): qty 10

## 2013-04-04 NOTE — Progress Notes (Signed)
Clinical Social Work Department BRIEF PSYCHOSOCIAL ASSESSMENT 04/04/2013  Patient:  Maciolek,Kimberly     Account Number:  [id number]     Admit date:  03/29/2013  Clinical Social Worker:  Kirke Shaggy  Date/Time:  04/04/2013 04:07 PM  Referred by:  Physician  Date Referred:  03/31/2013 Referred for  SNF Placement   Other Referral:   Interview type:  Family Other interview type:   CSW also called and spoke with the RN Britta Mccreedy at [facility]. She will come over to see the pt before she is d/c.  Pt may go back to her ALF at [facility] with [facility].  The other option may be for SNF placement.    PSYCHOSOCIAL DATA Living Status:  FACILITY Admitted from facility:   Level of care:   Primary support name:   Primary support relationship to patient:   Degree of support available:   [place], pt lives at [facility] assisted living.    CURRENT CONCERNS Current Concerns  Post-Acute Placement   Other Concerns:    SOCIAL WORK ASSESSMENT / PLAN  Assessment/plan status:   Other assessment/ plan:   Information/referral to community resources:    PATIENT'S/FAMILY'S RESPONSE TO PLAN OF CARE: CSW called and spoke with the pt's dtr about a possible SNF placement. Dtr is concerned her mom loosing her apt at the ALF. If pt is not ready to go back to ALF, then SNF might be the next choice.  CSW Connye Burkitt asked RN Lesle Reek from [facility] if the pt went to a SNF will the pt loose her apt at the ALF and she said NO...  CSW called and asked pt's MD if the pt could go to SNF with the IV antibiotics because the SNF's can accomodate giving the pt med's via IV. Pt's MD wants the pt to stay in the hospital thru the weekend.   Sherald Barge, LCSW-A Clinical Social Worker [phone]

## 2013-04-04 NOTE — Progress Notes (Signed)
Occupational Therapy Treatment Patient Details Name: [name] [name] MRN: [id number] DOB: 1928/03/15 Today's Date: 04/04/2013 Time: 4098-1191 OT Time Calculation (min): 19 min  OT Assessment / Plan / Recommendation Comments on Treatment Session Pt aroused to name call and focused attention . Pt + void bowel and at end of session requesting to void bladder. Pt remains confused and poor awareness to deficits.    Follow Up Recommendations  SNF;Supervision/Assistance - 24 hour    Barriers to Discharge       Equipment Recommendations  3 in 1 bedside comode;Wheelchair (measurements OT);Wheelchair cushion (measurements OT);Other (comment) (lift equipment)    Recommendations for Other Services    Frequency Min 2X/week   Plan Discharge plan remains appropriate    Precautions / Restrictions Precautions Precautions: Fall Precaution Comments: poor vision   Pertinent Vitals/Pain C./o pain at buttock Noted to have redness in peri area Could benefit from barrier cream/ high risk for skin break down    ADL  Lower Body Bathing: +1 Total assistance Where Assessed - Lower Body Bathing: Rolling right and/or left;Lean right and/or left Transfers/Ambulation Related to ADLs: not appropriate ADL Comments: Pt incontinent on arrival and unaware. pt log rolled right and left for hygiene. Pt needed max v/c and max (A) to log roll. pT holding bed rail to help maintain side lying. Pt provided UB and LB exercises. Pt placed on bed pan by OT and tech and front secretary notified patient should be checked within . Tech was called to room when hygiene was started. Time was provided best care for patient to ensure decr risk for skin break down.    OT Diagnosis:    OT Problem List:   OT Treatment Interventions:     OT Goals Acute Rehab OT Goals OT Goal Formulation: Patient unable to participate in goal setting Time For Goal Achievement: 04/09/13 Potential to Achieve Goals: Good ADL Goals Pt Will  Transfer to Toilet: with modified independence;Ambulation;3-in-1;with DME ADL Goal: Toilet Transfer - Progress: Not progressing Pt Will Perform Toileting - Clothing Manipulation: with modified independence;Standing ADL Goal: Toileting - Clothing Manipulation - Progress: Not progressing Pt Will Perform Toileting - Hygiene: with modified independence;Standing at 3-in-1/toilet ADL Goal: Toileting - Hygiene - Progress: Not progressing Additional ADL Goal #1: Mod I with bed mobility for ADL ADL Goal: Additional Goal #1 - Progress: Not progressing Additional ADL Goal #2: mod I with functional mobility for ADL within room with vc to compensate for decreased vision. ADL Goal: Additional Goal #2 - Progress: Not progressing  Visit Information  Last OT Received On: 04/04/13 Assistance Needed: +2    Subjective Data      Prior Functioning       Cognition  Cognition Arousal/Alertness: Lethargic Behavior During Therapy: Flat affect Overall Cognitive Status: Impaired/Different from baseline Area of Impairment: Orientation;Attention;Memory;Following commands;Awareness;Problem solving Orientation Level: Disoriented to;Person;Time;Place;Situation Current Attention Level: Focused Memory: Decreased recall of precautions Following Commands: Follows one step commands inconsistently;Follows one step commands with increased time Awareness: Intellectual Problem Solving: Slow processing;Decreased initiation    Mobility  Bed Mobility Rolling Right: 2: Max assist;With rail (with pad) Rolling Left: 2: Max assist;With rail Details for Bed Mobility Assistance: pad used pt initiating with UE and holding rail with v/c  Transfers Transfers: Not assessed    Exercises  Total Joint Exercises Hip ABduction/ADduction: AAROM;Right;Left;10 reps;Supine Knee Flexion: AAROM;Both;10 reps;Supine General Exercises - Lower Extremity Hip Flexion/Marching: AAROM;Both;10 reps;Supine   Balance     End of Session OT -  End of Session Activity  Tolerance: Patient limited by fatigue Patient left: in bed;with call bell/phone within reach (RN [facility] notified on bed pan) Nurse Communication: Mobility status;Precautions  GO     Lucile Shutters 04/04/2013, 2:51 PM Pager: [phone]

## 2013-04-04 NOTE — Progress Notes (Signed)
Utilization review completed.  

## 2013-04-04 NOTE — Progress Notes (Addendum)
Clinical Social Work Department CLINICAL SOCIAL WORK PLACEMENT NOTE 04/04/2013  Patient:  [name] [name],[name] [name]  Account Number:  [id number] Admit date:  03/29/2013  Clinical Social Worker:  Sherald Barge, Theresia Majors  Date/time:  04/04/2013 04:19 PM  Clinical Social Work is seeking post-discharge placement for this patient at the following level of care:      (*CSW will update this form in Epic as items are completed)     Patient/family provided with [facility] Department of Clinical Social Work's list of facilities offering this level of care within the geographic area requested by the patient (or if unable, by the patient's family).  04/02/2013  Patient/family informed of their freedom to choose among providers that offer the needed level of care, that participate in Medicare, Medicaid or managed care program needed by the patient, have an available bed and are willing to accept the patient.    Patient/family informed of [facility]' ownership interest in [facility], as well as of the fact that they are under no obligation to receive care at this facility.  PASARR submitted to EDS on 03/31/2013 PASARR number received from EDS on 03/31/2013  FL2 transmitted to all facilities in geographic area requested by pt/family on  03/31/2013 FL2 transmitted to all facilities within larger geographic area on 03/31/2013  Patient informed that his/her managed care company has contracts with or will negotiate with  certain facilities, including the following:     Patient/family informed of bed offers received:  04/01/2013 Patient chooses bed at [facility]    Patient to be transferred to [facility] on  04/07/2013. Patient to be transferred to facility by [facility].  Sherald Barge, LCSW-A Clinical Social Worker [phone]

## 2013-04-04 NOTE — Progress Notes (Signed)
Subjective: [name] [name] is arousable this morning and conversive.  Slowly feeling better.  Escherichia coli bacteremia and Escherichia coli UTI confirmed and organism is resistant to Cipro and sensitive to ceftriaxone.  Will continue IV ceftriaxone today and tomorrow and transitioned to oral Ceftin on Sunday.  Plans are for transitioning back to assisted living or skilled nursing depending on progress over the weekend.  Objective: Weight change:   Intake/Output Summary (Last 24 hours) at 04/04/13 0825 Last data filed at 04/03/13 2345  Gross per 24 hour  Intake    945 ml  Output      0 ml  Net    945 ml   Filed Vitals:   04/04/13 0200 04/04/13 0245 04/04/13 0400 04/04/13 0806  BP: 146/58 151/65 158/58 158/61  Pulse: 61 65 66 67  Temp: 98.3 F (36.8 C) 98.5 F (36.9 C) 99.4 F (37.4 C) 97.7 F (36.5 C)  TempSrc: Axillary Oral Oral Oral  Resp: 18 16 16 16   Height:      Weight:      SpO2: 95% 98% 100% 98%   General Appearance: Somnolent but arousable, cooperative, no distress, appears stated age, eyes are clearing daily with less exudate. She is conversive today and recognizes me readily - but still groggy  HEENT: Eyes are clearing nicely, no exudate  Lungs: Clear to auscultation bilaterally, respirations unlabored  Heart: Regular rate and rhythm, S1 and S2 normal, no murmur, rub or gallop  Abdomen: Soft, non-tender, bowel sounds active all four quadrants, no masses, no organomegaly  Extremities: Extremities normal, atraumatic, no cyanosis or edema  Neuro: No focal neurologic deficit. Moves all extremities   Lab Results: Results for orders placed during the hospital encounter of 03/29/13 (from the past 48 hour(s))  GLUCOSE, CAPILLARY     Status: Abnormal   Collection Time    04/02/13 11:48 AM      Result Value Range   Glucose-Capillary 144 (*) 70 - 99 mg/dL   Comment 1 Notify RN    OCCULT BLOOD X 1 CARD TO LAB, STOOL     Status: None   Collection Time    04/02/13  3:29 PM      Result Value Range   Fecal Occult Bld NEGATIVE  NEGATIVE  GLUCOSE, CAPILLARY     Status: Abnormal   Collection Time    04/02/13  4:27 PM      Result Value Range   Glucose-Capillary 199 (*) 70 - 99 mg/dL   Comment 1 Notify RN    CBC WITH DIFFERENTIAL     Status: Abnormal   Collection Time    04/02/13  5:37 PM      Result Value Range   WBC 12.3 (*) 4.0 - 10.5 K/uL   RBC 2.45 (*) 3.87 - 5.11 MIL/uL   Hemoglobin 8.3 (*) 12.0 - 15.0 g/dL   HCT 40.9 (*) 81.1 - 91.4 %   MCV 94.7  78.0 - 100.0 fL   MCH 33.9  26.0 - 34.0 pg   MCHC 35.8  30.0 - 36.0 g/dL   RDW 78.2  95.6 - 21.3 %   Platelets 90 (*) 150 - 400 K/uL   Comment: REPEATED TO VERIFY     SPECIMEN CHECKED FOR CLOTS     CONSISTENT WITH PREVIOUS RESULT   Neutrophils Relative 87 (*) 43 - 77 %   Neutro Abs 10.7 (*) 1.7 - 7.7 K/uL   Lymphocytes Relative 7 (*) 12 - 46 %   Lymphs Abs 0.8  0.7 - 4.0 K/uL   Monocytes Relative 6  3 - 12 %   Monocytes Absolute 0.7  0.1 - 1.0 K/uL   Eosinophils Relative 0  0 - 5 %   Eosinophils Absolute 0.0  0.0 - 0.7 K/uL   Basophils Relative 0  0 - 1 %   Basophils Absolute 0.0  0.0 - 0.1 K/uL  GLUCOSE, CAPILLARY     Status: Abnormal   Collection Time    04/02/13 10:36 PM      Result Value Range   Glucose-Capillary 137 (*) 70 - 99 mg/dL  CBC WITH DIFFERENTIAL     Status: Abnormal   Collection Time    04/03/13  5:55 AM      Result Value Range   WBC 10.1  4.0 - 10.5 K/uL   RBC 2.29 (*) 3.87 - 5.11 MIL/uL   Hemoglobin 7.6 (*) 12.0 - 15.0 g/dL   HCT 54.0 (*) 98.1 - 19.1 %   MCV 95.2  78.0 - 100.0 fL   MCH 33.2  26.0 - 34.0 pg   MCHC 34.9  30.0 - 36.0 g/dL   RDW 47.8  29.5 - 62.1 %   Platelets 96 (*) 150 - 400 K/uL   Comment: CONSISTENT WITH PREVIOUS RESULT   Neutrophils Relative 89 (*) 43 - 77 %   Neutro Abs 9.0 (*) 1.7 - 7.7 K/uL   Lymphocytes Relative 6 (*) 12 - 46 %   Lymphs Abs 0.6 (*) 0.7 - 4.0 K/uL   Monocytes Relative 5  3 - 12 %   Monocytes Absolute 0.5  0.1 - 1.0 K/uL    Eosinophils Relative 0  0 - 5 %   Eosinophils Absolute 0.0  0.0 - 0.7 K/uL   Basophils Relative 0  0 - 1 %   Basophils Absolute 0.0  0.0 - 0.1 K/uL  BASIC METABOLIC PANEL     Status: Abnormal   Collection Time    04/03/13  5:55 AM      Result Value Range   Sodium 136  135 - 145 mEq/L   Potassium 3.6  3.5 - 5.1 mEq/L   Chloride 106  96 - 112 mEq/L   CO2 18 (*) 19 - 32 mEq/L   Glucose, Bld 155 (*) 70 - 99 mg/dL   BUN 46 (*) 6 - 23 mg/dL   Creatinine, Ser 3.08 (*) 0.50 - 1.10 mg/dL   Calcium 7.9 (*) 8.4 - 10.5 mg/dL   GFR calc non Af Amer 35 (*) >90 mL/min   GFR calc Af Amer 41 (*) >90 mL/min   Comment:            The eGFR has been calculated     using the CKD EPI equation.     This calculation has not been     validated in all clinical     situations.     eGFR's persistently     <90 mL/min signify     possible Chronic Kidney Disease.  GLUCOSE, CAPILLARY     Status: Abnormal   Collection Time    04/03/13  7:39 AM      Result Value Range   Glucose-Capillary 147 (*) 70 - 99 mg/dL  PREPARE RBC (CROSSMATCH)     Status: None   Collection Time    04/03/13  8:50 AM      Result Value Range   Order Confirmation ORDER PROCESSED BY BLOOD BANK    TYPE AND SCREEN     Status: None  Collection Time    04/03/13  8:50 AM      Result Value Range   ABO/RH(D) A POS     Antibody Screen NEG     Sample Expiration 04/06/2013     Unit Number [id number]     Blood Component Type RED CELLS,LR     Unit division 00     Status of Unit ISSUED     Transfusion Status OK TO TRANSFUSE     Crossmatch Result Compatible     Unit Number [id number]     Blood Component Type RED CELLS,LR     Unit division 00     Status of Unit ISSUED     Transfusion Status OK TO TRANSFUSE     Crossmatch Result Compatible    GLUCOSE, CAPILLARY     Status: Abnormal   Collection Time    04/03/13 12:26 PM      Result Value Range   Glucose-Capillary 131 (*) 70 - 99 mg/dL  GLUCOSE, CAPILLARY     Status: Abnormal    Collection Time    04/03/13  4:49 PM      Result Value Range   Glucose-Capillary 154 (*) 70 - 99 mg/dL  BASIC METABOLIC PANEL     Status: Abnormal   Collection Time    04/04/13  6:20 AM      Result Value Range   Sodium 136  135 - 145 mEq/L   Potassium 3.6  3.5 - 5.1 mEq/L   Chloride 106  96 - 112 mEq/L   CO2 18 (*) 19 - 32 mEq/L   Glucose, Bld 151 (*) 70 - 99 mg/dL   BUN 44 (*) 6 - 23 mg/dL   Creatinine, Ser 3.08  0.50 - 1.10 mg/dL   Calcium 8.2 (*) 8.4 - 10.5 mg/dL   GFR calc non Af Amer 46 (*) >90 mL/min   GFR calc Af Amer 53 (*) >90 mL/min   Comment:            The eGFR has been calculated     using the CKD EPI equation.     This calculation has not been     validated in all clinical     situations.     eGFR's persistently     <90 mL/min signify     possible Chronic Kidney Disease.  CBC WITH DIFFERENTIAL     Status: Abnormal   Collection Time    04/04/13  6:20 AM      Result Value Range   WBC 10.0  4.0 - 10.5 K/uL   RBC 3.25 (*) 3.87 - 5.11 MIL/uL   Hemoglobin 10.4 (*) 12.0 - 15.0 g/dL   Comment: POST TRANSFUSION SPECIMEN   HCT 29.3 (*) 36.0 - 46.0 %   MCV 90.2  78.0 - 100.0 fL   MCH 32.0  26.0 - 34.0 pg   MCHC 35.5  30.0 - 36.0 g/dL   RDW 65.7 (*) 84.6 - 96.2 %   Platelets 99 (*) 150 - 400 K/uL   Comment: CONSISTENT WITH PREVIOUS RESULT   Neutrophils Relative 80 (*) 43 - 77 %   Neutro Abs 8.0 (*) 1.7 - 7.7 K/uL   Lymphocytes Relative 11 (*) 12 - 46 %   Lymphs Abs 1.1  0.7 - 4.0 K/uL   Monocytes Relative 9  3 - 12 %   Monocytes Absolute 0.9  0.1 - 1.0 K/uL   Eosinophils Relative 0  0 - 5 %   Eosinophils Absolute 0.0  0.0 - 0.7 K/uL   Basophils Relative 0  0 - 1 %   Basophils Absolute 0.0  0.0 - 0.1 K/uL  GLUCOSE, CAPILLARY     Status: Abnormal   Collection Time    04/04/13  7:41 AM      Result Value Range   Glucose-Capillary 138 (*) 70 - 99 mg/dL    Studies/Results: Ct Head Wo Contrast  04/02/2013  *RADIOLOGY REPORT*  Clinical Data: Speech changes.   Fall.  Urinary tract infection. Fever 2 days ago.  CT HEAD WITHOUT CONTRAST  Technique:  Contiguous axial images were obtained from the base of the skull through the vertex without contrast.  Comparison: 03/30/2013  Findings: The whole areas of encephalomalacia in the right posterior parietal and left posterior temporal and occipital regions consistent with old infarcts.  Diffuse cerebral atrophy. No mass effect or midline shift.  No abnormal extra-axial fluid collections.  Gray-white matter junctions are distinct.  Basal cisterns are not effaced.  Ventricular dilatation consistent with combination of central atrophy and negative mass effect due to infarcts.  No effacement basal cisterns.  No acute intracranial hemorrhage.  Old cerebellar infarcts bilaterally.  Stable appearance of intracranial contents since previous study. Retention cyst in the right maxillary antrum.  Postoperative change in the right mastoid air cells.  No depressed skull fractures.  IMPRESSION: No acute intracranial abnormalities.  Stable appearance of multi focal infarcts since previous study.  Chronic atrophy and small vessel ischemic changes.   Original Report Authenticated By: Burman Nieves, M.D.    Medications: Scheduled Meds: . carbamazepine  400 mg Oral BID  . cefTRIAXone (ROCEPHIN)  IV  1 g Intravenous Q24H  . Chlorhexidine Gluconate Cloth  6 each Topical Q0600  . cholecalciferol  1,000 Units Oral BID  . clopidogrel  75 mg Oral Daily  . ferrous sulfate  325 mg Oral Q breakfast  . insulin aspart  0-9 Units Subcutaneous TID WC  . irbesartan  300 mg Oral Daily  . isosorbide mononitrate  60 mg Oral Daily  . metoprolol succinate  50 mg Oral Daily  . mupirocin ointment  1 application Nasal BID  . olopatadine  1 drop Both Eyes Daily  . pantoprazole  40 mg Oral Daily  . senna-docusate  1 tablet Oral BID  . simvastatin  20 mg Oral QHS  . sodium chloride  3 mL Intravenous Q12H  . sodium chloride  3 mL Intravenous Q12H  .  tobramycin-dexamethasone  2 drop Both Eyes Q6H   Continuous Infusions: . sodium chloride 60 mL/hr at 04/04/13 0458   PRN Meds:.sodium chloride, acetaminophen, acetaminophen, albuterol, nitroGLYCERIN, ondansetron, ondansetron, polyvinyl alcohol, sodium chloride  Assessment/Plan:  1. Fall: Patient presented following a fall and found to be likely secondary to dehydration and UTI and possible vasovagal episode.  2. Right hip pain: This is an incidental finding on physical exam. X-ray without fracture or acute abnormality - symptomatically improving  3. Altered mental status: Patient is chronically very forgetful, and has no memory of event. Likely secondary to UTI and also on top of mild dementia chronically - slow improvement 4. Hyponatremia: Resolved with IV hydration with normal saline. Continue saline infusion until eating well and more alert.  Still azotemic but BUN and creatinine are improving 5. Dehydration: Patient has elevated BUN/creatinine but improving with BUN 46 and creatinine 1.35 yesterday and 44 and 1.09 today,  IV fluids continue for now - discontinue over weekend as azotemia resolves 6. CAD (coronary artery disease): Appears stable.asymptomatic. Cardiac  enzymes negative on cycling  7. HTN (hypertension): Controlled on pre-admission antihypertensives.  8. GERD (gastroesophageal reflux disease): Asymptomatic at this time.  9. Seizure disorder: Continue anticonvulsant.  Depakote was discontinued which was for behavior control, may need to re\re add when she becomes more alert. Tegretol level okay 2 days ago  10. Sinus node dysfunction: Patient is s/p PPM 06/2012. Cardiac monitor reveals SR and occasional paced complexes. As described above, pacer check is okay  11. DM: This is type 2,and appears reasonably controlled, based on random blood glucose of 136. Will manage with SSI for now, and check HBA1C.  12. Abdominal pain - has resolved  13. Conjunctivitis - clearing nicely with  TobraDex  14.  Escherichia coli bacteremia likely secondary to UTI - continue IV Rocephin through Saturday and switch to Ceftin on Sunday.  Resistant to Cipro  15. Septic encephalopathy -  improving gradually  16. Multi-infarct dementia - multiple infarcts in the past resulting in cognitive dysfunction/dementia - CT scan 2 days ago revealed no new infarcts  15. Disposition - plan is for skilled nursing facility placement early next week     LOS: 6 days   Pearla Dubonnet, MD 04/04/2013, 8:25 AM

## 2013-04-05 LAB — CBC WITH DIFFERENTIAL/PLATELET
Basophils Absolute: 0 10*3/uL (ref 0.0–0.1)
Eosinophils Absolute: 0.1 10*3/uL (ref 0.0–0.7)
Hemoglobin: 10.3 g/dL — ABNORMAL LOW (ref 12.0–15.0)
Lymphs Abs: 0.9 10*3/uL (ref 0.7–4.0)
MCH: 32.2 pg (ref 26.0–34.0)
Monocytes Absolute: 1 10*3/uL (ref 0.1–1.0)
Monocytes Relative: 10 % (ref 3–12)
Neutro Abs: 7.5 10*3/uL (ref 1.7–7.7)
Platelets: 96 10*3/uL — ABNORMAL LOW (ref 150–400)
RBC: 3.2 MIL/uL — ABNORMAL LOW (ref 3.87–5.11)
RDW: 16.1 % — ABNORMAL HIGH (ref 11.5–15.5)

## 2013-04-05 LAB — BASIC METABOLIC PANEL
GFR calc Af Amer: 47 mL/min — ABNORMAL LOW (ref >90)
GFR calc non Af Amer: 41 mL/min — ABNORMAL LOW (ref >90)
Glucose, Bld: 156 mg/dL — ABNORMAL HIGH (ref 70–99)
Potassium: 3.7 mEq/L (ref 3.5–5.1)
Sodium: 136 mEq/L (ref 135–145)

## 2013-04-05 LAB — GLUCOSE, CAPILLARY
Glucose-Capillary: 104 mg/dL — ABNORMAL HIGH (ref 70–99)
Glucose-Capillary: 160 mg/dL — ABNORMAL HIGH (ref 70–99)

## 2013-04-05 NOTE — Progress Notes (Signed)
Subjective: Alert, pleasant voices no complaints  Objective: Vital signs in last 24 hours: Temp:  [98 F (36.7 C)-98.7 F (37.1 C)] 98.3 F (36.8 C) (05/03 0800) Pulse Rate:  [58-71] 58 (05/03 0800) Resp:  [16-20] 17 (05/03 0800) BP: (130-161)/(50-85) 151/50 mmHg (05/03 0800) SpO2:  [94 %-98 %] 97 % (05/03 0800) Weight change:  Last BM Date: 04/02/13  Intake/Output from previous day: 05/02 0701 - 05/03 0700 In: 720 [P.O.:720] Out: -  Intake/Output this shift:    Resp: clear to auscultation bilaterally Cardio: regular rate and rhythm, S1, S2 normal, no murmur, click, rub or gallop GI: soft, non-tender; bowel sounds normal; no masses,  no organomegaly Extremities: extremities normal, atraumatic, no cyanosis or edema  Lab Results:  Recent Labs  04/04/13 0620 04/05/13 0430  WBC 10.0 9.5  HGB 10.4* 10.3*  HCT 29.3* 28.9*  PLT 99* 96*   BMET  Recent Labs  04/04/13 0620 04/05/13 0430  NA 136 136  K 3.6 3.7  CL 106 107  CO2 18* 18*  GLUCOSE 151* 156*  BUN 44* 43*  CREATININE 1.09 1.20*  CALCIUM 8.2* 8.2*    Studies/Results: No results found.  Medications:  Scheduled: . carbamazepine  400 mg Oral BID  . cefTRIAXone (ROCEPHIN)  IV  1 g Intravenous Q24H  . cholecalciferol  1,000 Units Oral BID  . clopidogrel  75 mg Oral Daily  . ferrous sulfate  325 mg Oral Q breakfast  . insulin aspart  0-9 Units Subcutaneous TID WC  . irbesartan  300 mg Oral Daily  . isosorbide mononitrate  60 mg Oral Daily  . metoprolol succinate  50 mg Oral Daily  . olopatadine  1 drop Both Eyes Daily  . pantoprazole  40 mg Oral Daily  . senna-docusate  1 tablet Oral BID  . simvastatin  20 mg Oral QHS  . sodium chloride  3 mL Intravenous Q12H  . sodium chloride  3 mL Intravenous Q12H  . tobramycin-dexamethasone  2 drop Both Eyes Q6H    Assessment/Plan: 1. uti ecoli on rocephin will switch to ceftin tomorrow 2. Multiinfarct dementia, she is alert, pleasant apparently approaching  baseline 3. Diabetes fair control on sliding scale 4. Disposition awaiting nursing home  5. htn good control  LOS: 7 days   [facility] 04/05/2013, 10:07 AM

## 2013-04-06 MED ORDER — CEFUROXIME AXETIL 250 MG PO TABS
250.0000 mg | ORAL_TABLET | Freq: Two times a day (BID) | ORAL | Status: DC
  Administered 2013-04-06 – 2013-04-07 (×3): 250 mg via ORAL
  Filled 2013-04-06 (×5): qty 1

## 2013-04-06 NOTE — Progress Notes (Signed)
Subjective: Alert,pleasant no complaints  Objective: Vital signs in last 24 hours: Temp:  [98.3 F (36.8 C)-98.8 F (37.1 C)] 98.3 F (36.8 C) (05/04 0400) Pulse Rate:  [58-75] 60 (05/04 0400) Resp:  [16-18] 16 (05/04 0400) BP: (134-159)/(40-68) 143/40 mmHg (05/04 0400) SpO2:  [97 %-99 %] 99 % (05/04 0400) Weight change:  Last BM Date: 04/05/13  Intake/Output from previous day: 05/03 0701 - 05/04 0700 In: 240 [P.O.:240] Out: -  Intake/Output this shift:    Resp: clear to auscultation bilaterally Cardio: regular rate and rhythm, S1, S2 normal, no murmur, click, rub or gallop Extremities: extremities normal, atraumatic, no cyanosis or edema  Lab Results:  Recent Labs  04/04/13 0620 04/05/13 0430  WBC 10.0 9.5  HGB 10.4* 10.3*  HCT 29.3* 28.9*  PLT 99* 96*   BMET  Recent Labs  04/04/13 0620 04/05/13 0430  NA 136 136  K 3.6 3.7  CL 106 107  CO2 18* 18*  GLUCOSE 151* 156*  BUN 44* 43*  CREATININE 1.09 1.20*  CALCIUM 8.2* 8.2*    Studies/Results: No results found.  Medications:  Scheduled: . carbamazepine  400 mg Oral BID  . cefTRIAXone (ROCEPHIN)  IV  1 g Intravenous Q24H  . cholecalciferol  1,000 Units Oral BID  . clopidogrel  75 mg Oral Daily  . ferrous sulfate  325 mg Oral Q breakfast  . insulin aspart  0-9 Units Subcutaneous TID WC  . irbesartan  300 mg Oral Daily  . isosorbide mononitrate  60 mg Oral Daily  . metoprolol succinate  50 mg Oral Daily  . olopatadine  1 drop Both Eyes Daily  . pantoprazole  40 mg Oral Daily  . senna-docusate  1 tablet Oral BID  . simvastatin  20 mg Oral QHS  . sodium chloride  3 mL Intravenous Q12H  . sodium chloride  3 mL Intravenous Q12H  . tobramycin-dexamethasone  2 drop Both Eyes Q6H    Assessment/Plan: 1.ecoli uti will switch from rocephin to ceftin 2. Multinfarct dementia, back to baseline alert pleasant 3. Diabetes on sliding scale 4. htn good control  LOS: 8 days   [facility] 04/06/2013,  6:55 AM

## 2013-04-07 LAB — GLUCOSE, CAPILLARY
Glucose-Capillary: 124 mg/dL — ABNORMAL HIGH (ref 70–99)
Glucose-Capillary: 125 mg/dL — ABNORMAL HIGH (ref 70–99)
Glucose-Capillary: 170 mg/dL — ABNORMAL HIGH (ref 70–99)

## 2013-04-07 MED ORDER — INSULIN ASPART 100 UNIT/ML ~~LOC~~ SOLN: 0.0000 [IU] | Freq: Three times a day (TID) | SUBCUTANEOUS | Status: DC

## 2013-04-07 MED ORDER — CEFUROXIME AXETIL 250 MG PO TABS: 250.0000 mg | ORAL_TABLET | Freq: Two times a day (BID) | ORAL | Status: AC

## 2013-04-07 NOTE — Progress Notes (Signed)
Spoken to patient about plan for discharge. Social Worker Sherald Barge called daughter in patients room.

## 2013-04-07 NOTE — Progress Notes (Signed)
Report called to [facility]

## 2013-04-07 NOTE — Discharge Summary (Addendum)
Physician Discharge Summary  NAME:Chatara Kolek  ZOX:[id number]  DOB: Apr 09, 1928   Admit date: 03/29/2013 Discharge date: 04/07/2013  Discharge Diagnoses:   1. Fall: Patient presented following a fall and found to be likely secondary to dehydration and UTI and possible vasovagal episode.  2. Right hip pain: This is an incidental finding on physical exam. X-ray without fracture or acute abnormality - symptomatically improved  3. Altered mental status: Patient is chronically very forgetful, and has no memory of event. Likely secondary to UTI and also on top of mild dementia chronically - continuing to slowly improve. 4. Hyponatremia: Resolved with IV hydration with normal saline. 5. Dehydration: Patient has elevated BUN/creatinine but improving with BUN 46 and creatinine 1.35 yesterday and 44 and 1.09 today, IV fluids continue for now - discontinue over weekend as azotemia resolves  6. CAD (coronary artery disease): Appears stable.asymptomatic. Cardiac enzymes negative on cycling  7. HTN (hypertension): Controlled on pre-admission antihypertensives.  8. GERD (gastroesophageal reflux disease): Asymptomatic at this time.  9. Seizure disorder: Continue anticonvulsant. Depakote was discontinued which was for behavior control, may need to re\re add when she becomes more alert. Tegretol level okay while hospitalized  10. Sinus node dysfunction: Patient is s/p PPM 06/2012. Cardiac monitor reveals SR and occasional paced complexes. As described above, pacer check is okay  11. DM: This is type 2,and appears reasonably controlled, based on random blood glucose of 136. Will manage with SSI for now - recommended at least twice a day after discharge and may be able to discontinue.  We'll resume glyburide.  12. Abdominal pain - has resolved  13. Conjunctivitis - cleared nicely with TobraDex  14. Escherichia coli bacteremia likely secondary to UTI - continue IV Rocephin was switched to by mouth Ceftin and needs to take  for 7 more days and then recheck urinalysis for clearing 15. Septic encephalopathy - improving gradually  16. Multi-infarct dementia - multiple infarcts in the past resulting in cognitive dysfunction/dementia - CT scan 5 days ago revealed no new infarcts  15. Disposition - discharge to skilled nursing facility today with possible transition back to assisted living later     Discharge Physical Exam:  General Appearance: Alert, cooperative, no distress, appears stated age  Weight change:   Intake/Output Summary (Last 24 hours) at 04/07/13 0836 Last data filed at 04/07/13 0550  Gross per 24 hour  Intake   1140 ml  Output      0 ml  Net   1140 ml   Filed Vitals:   04/06/13 1029 04/06/13 1200 04/06/13 2033 04/07/13 0627  BP: 157/58 165/52 154/51 169/77  Pulse: 60 62 59 69  Temp:  98.2 F (36.8 C) 98.4 F (36.9 C) 98 F (36.7 C)  TempSrc:  Oral Oral Oral  Resp:  17 18 18   Height:      Weight:      SpO2:  98% 98% 98%   General Appearance: Mildly lethargic but much more arousable than last week and quite conversive this morning, cooperative, no distress, appears stated age, eyes are clear with no exudate. She is conversive today and recognizes me readily - minimally groggy  HEENT: Eyes are clearing nicely, no exudate  Lungs: Clear to auscultation bilaterally, respirations unlabored  Heart: Regular rate and rhythm, S1 and S2 normal, no murmur, rub or gallop  Abdomen: Soft, non-tender, bowel sounds active all four quadrants, no masses, no organomegaly  Extremities: Extremities normal, atraumatic, no cyanosis or edema  Neuro: No focal neurologic deficit. Moves  all extremities  Discharge Condition: Much improved but still not back to baseline where she can ambulate on her own  Hospital Course: Mrs. Jahzaria Vary is a very pleasant 79 year old female with multi-infarct dementia, type 2 diabetes, hypertension, status post CVA, seizure disorder, GERD, CAD, chronic left bundle branch block,  sinus node dysfunction with permanent pacemaker placed in July 2013, dyslipidemia who has been hospitalized over the past couple of years with several episodes of syncope.  Workup this have been negative.  She again had syncope and during this hospitalization was found to have Escherichia coli bacteremia and a UTI which probably set this clinical syndrome of initially.  She did have some mild right hip pain after falling during syncope but x-ray was negative.  She was also hyponatremic on admission which has resolved with IV hydration.  She had bacterial conjunctivitis on admission which has cleared with TobraDex.  She is still fatigued and chronically confused and may need chronic skilled nursing facility placement instead of assisted living placement.  It also may be that she can transition back to assisted living after rehabilitation at skilled nursing.  She is trending back to her usual baseline but will need physical therapy and occupational therapy determined chronic appropriate status for 24-hour care.  She has a daughter who lives in [place] who is her closest relative.  Please see problems as listed above for further care  Things to follow up in the outpatient setting: Patient will be in skilled nursing  Consults: Treatment Team:  Marden Noble, MD  Disposition: Discharge to skilled nursing facility  Discharge Orders   Future Orders Complete By Expires     Call MD for:  difficulty breathing, headache or visual disturbances  As directed     Call MD for:  persistant nausea and vomiting  As directed     Call MD for:  severe uncontrolled pain  As directed     Call MD for:  temperature >100.4  As directed     Diet - low sodium heart healthy  As directed     Discharge instructions  As directed     Comments:      Discharge to skilled nursing facility for physical therapy and occupational therapy with hopes that she may be over to return to assisted living.  Dementia is progressing and this may not  be possible    Increase activity slowly  As directed         Medication List    TAKE these medications       aspirin EC 325 MG tablet  Take 325 mg by mouth daily.     carbamazepine 200 MG tablet  Commonly known as:  TEGRETOL  Take 400 mg by mouth 2 (two) times daily.     cefUROXime 250 MG tablet  Commonly known as:  CEFTIN  Take 1 tablet (250 mg total) by mouth 2 (two) times daily.     cholecalciferol 1000 UNITS tablet  Commonly known as:  VITAMIN D  Take 1,000 Units by mouth 2 (two) times daily.     clopidogrel 75 MG tablet  Commonly known as:  PLAVIX  Take 75 mg by mouth daily.     divalproex 125 MG DR tablet  Commonly known as:  DEPAKOTE  Take 125 mg by mouth 2 (two) times daily.     ferrous sulfate 325 (65 FE) MG tablet  Take 325 mg by mouth daily with breakfast.     gentamicin 0.3 % ophthalmic solution  Commonly known as:  GARAMYCIN  Place 1 drop into both eyes 4 (four) times daily as needed. For bacterial infection  Use for course of 1 week when condition occurs     glyBURIDE 2.5 MG tablet  Commonly known as:  DIABETA  Take 2.5 mg by mouth every morning.     insulin aspart 100 UNIT/ML injection  Commonly known as:  novoLOG  Inject 0-9 Units into the skin 3 (three) times daily with meals.     isosorbide mononitrate 30 MG 24 hr tablet  Commonly known as:  IMDUR  Take 60 mg by mouth daily.     LASTACAFT 0.25 % Soln  Generic drug:  Alcaftadine  Place 1 drop into both eyes daily.     LORazepam 0.5 MG tablet  Commonly known as:  ATIVAN  Take 0.25 mg by mouth See admin instructions. Take 0.25mg  TWICE DAILY. May take every 4 hours IF NEEDED for agitation /anxiety. Do not give within 4 hours of scheduled Lorazepam.     metoprolol succinate 25 MG 24 hr tablet  Commonly known as:  TOPROL XL  Take 2 tablets (50 mg total) by mouth daily.     mometasone 0.1 % ointment  Commonly known as:  ELOCON  Apply 1 application topically daily. Apply around and to skin  of eyelids for 1 week courses when inflammation occurs, do not get this medication in the eyes     nitroGLYCERIN 0.4 MG SL tablet  Commonly known as:  NITROSTAT  Place 0.4 mg under the tongue every 5 (five) minutes x 3 doses as needed. For chest pain     omeprazole 20 MG capsule  Commonly known as:  PRILOSEC  Take 20 mg by mouth daily before breakfast. Take on empty stomach at 630 am     REFRESH TEARS OP  Place 1 drop into both eyes as needed. For dry/irritated eyes     senna-docusate 8.6-50 MG per tablet  Commonly known as:  Senokot-S  Take 1 tablet by mouth 2 (two) times daily.     simvastatin 20 MG tablet  Commonly known as:  ZOCOR  Take 20 mg by mouth at bedtime.     telmisartan 80 MG tablet  Commonly known as:  MICARDIS  Take 80 mg by mouth daily.     traMADol 50 MG tablet  Commonly known as:  ULTRAM  Take 50 mg by mouth every 6 (six) hours as needed for pain.         The results of significant diagnostics from this hospitalization (including imaging, microbiology, ancillary and laboratory) are listed below for reference.    Significant Diagnostic Studies: Dg Chest 2 View  04/01/2013  *RADIOLOGY REPORT*  Clinical Data: Tachypnea and fever.  CHEST - 2 VIEW  Comparison: 06/21/2012.  03/30/2013  Findings: Cardiac silhouette is borderline upper limits of normal size. Ectasia and nonaneurysmal calcification of the thoracic aorta are seen.  Two lead transvenous pacemaker is in place with generator on the left.  No consolidation or pleural effusion is evident.  There is slight prominence of reticular interstitial markings on the lateral image.  There is central peribronchial thickening.  There is osteopenic appearance of bones.  IMPRESSION: Borderline cardiac size.  Transvenous pacemaker in place.  Slight prominence of reticular interstitial markings on lateral image with central peribronchial thickening.  No consolidation or pleural effusion is evident.   Original Report  Authenticated By: Onalee Hua Call    Dg Hip Complete Right  03/30/2013  *RADIOLOGY  REPORT*  Clinical Data: Fall with right hip pain.  RIGHT HIP - COMPLETE 2+ VIEW  Comparison: None  Findings: There is no evidence of fracture, subluxation or dislocation. The joint spaces are unremarkable. No focal bony lesions are identified. No significant abnormalities are noted.  IMPRESSION: No evidence of acute abnormality.   Original Report Authenticated By: Harmon Pier, M.D.    Ct Head Wo Contrast  04/02/2013  *RADIOLOGY REPORT*  Clinical Data: Speech changes.  Fall.  Urinary tract infection. Fever 2 days ago.  CT HEAD WITHOUT CONTRAST  Technique:  Contiguous axial images were obtained from the base of the skull through the vertex without contrast.  Comparison: 03/30/2013  Findings: The whole areas of encephalomalacia in the right posterior parietal and left posterior temporal and occipital regions consistent with old infarcts.  Diffuse cerebral atrophy. No mass effect or midline shift.  No abnormal extra-axial fluid collections.  Gray-white matter junctions are distinct.  Basal cisterns are not effaced.  Ventricular dilatation consistent with combination of central atrophy and negative mass effect due to infarcts.  No effacement basal cisterns.  No acute intracranial hemorrhage.  Old cerebellar infarcts bilaterally.  Stable appearance of intracranial contents since previous study. Retention cyst in the right maxillary antrum.  Postoperative change in the right mastoid air cells.  No depressed skull fractures.  IMPRESSION: No acute intracranial abnormalities.  Stable appearance of multi focal infarcts since previous study.  Chronic atrophy and small vessel ischemic changes.   Original Report Authenticated By: Burman Nieves, M.D.    Ct Head Wo Contrast  03/30/2013  *RADIOLOGY REPORT*  Clinical Data: Loss of consciousness after fall.  CT HEAD WITHOUT CONTRAST  Technique:  Contiguous axial images were obtained from the base  of the skull through the vertex without contrast.  Comparison: 02/28/2013  Findings: Diffuse cerebral atrophy.  Ventricular dilatation consistent with central atrophy.  Focal areas of encephalomalacia in the right parietal lobe and left occipital lobe consistent with old infarcts.  There is associated ventricular dilatation due to negative mass effect.  These changes are stable since the previous study.  Old lacunar infarcts in the cerebellar hemispheres.  No mass effect or midline shift.  No abnormal extra-axial fluid collections.  Gray-white matter junctions are distinct.  Basal cisterns are not effaced.  No evidence of acute intracranial hemorrhage.  No depressed skull fractures.  Mucosal membrane thickening in the maxillary antra.  No acute air-fluid levels are demonstrated.  Vascular calcifications.  Postoperative changes in the right mastoid air cells with air-fluid levels demonstrated in the right mastoid region.  IMPRESSION: No acute intracranial abnormalities.  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  Postoperative changes in the right mastoid region with air-fluid level present.   Original Report Authenticated By: Burman Nieves, M.D.    Ct Abdomen Pelvis W Contrast  03/30/2013  *RADIOLOGY REPORT*  Clinical Data: Loss of consciousness.  Elevated liver function studies.  Fall.  CT ABDOMEN AND PELVIS WITH CONTRAST  Technique:  Multidetector CT imaging of the abdomen and pelvis was performed following the standard protocol during bolus administration of intravenous contrast.  Contrast: OMNIPAQUE IOHEXOL 300 MG/ML  SOLN  Comparison: 08/09/2008  Findings: Technically limited study due to motion artifact. Atelectasis or fibrosis in the lung bases.  Surgical absence of the gallbladder.  Pneumobilia is likely postoperative.  This was present previously.  No focal liver lesions are appreciated.  Diffuse fatty infiltration of the pancreas.  Spleen, adrenal glands,  kidneys, inferior  vena cava, and retroperitoneal lymph nodes are unremarkable.  Calcification of the aorta without aneurysm.  The stomach and small bowel are decompressed.  Stool filled colon without distension.  No free air or free fluid in the abdomen.  Pelvis:  Bladder wall is not thickened.  Uterus is atrophic or surgically absent.  Diverticula in the sigmoid colon without diverticulitis.  The appendix is not identified.  No abnormal adnexal masses.  No free or loculated pelvic fluid collections.  No significant pelvic lymphadenopathy.  Degenerative changes throughout the lumbar spine.  Diffuse demineralization.  No vertebral compression fractures.  Normal alignment of the lumbar vertebrae.  Pelvis, sacrum, and hips appear intact.  IMPRESSION: No acute abnormalities demonstrated in the abdomen or pelvis. Surgical absence of the gallbladder with pneumobilia, likely postoperative.  No significant changes since previous study.   Original Report Authenticated By: Burman Nieves, M.D.    Dg Chest Port 1 View  03/30/2013  *RADIOLOGY REPORT*  Clinical Data: Altered mental status  PORTABLE CHEST - 1 VIEW  Comparison: 01/20/2013; 06/21/2012  Findings: Grossly unchanged cardiac silhouette and mediastinal contours given slightly reduced lung volumes.  Stable positioning of support apparatus.  No focal parenchymal opacities.  No pleural effusion or pneumothorax.  No definite evidence of edema. Unchanged bones.  IMPRESSION: No acute cardiopulmonary disease.   Original Report Authenticated By: Tacey Ruiz, MD     Microbiology: Recent Results (from the past 240 hour(s))  MRSA PCR SCREENING     Status: Abnormal   Collection Time    03/30/13  9:08 AM      Result Value Range Status   MRSA by PCR POSITIVE (*) NEGATIVE Final   Comment:            The GeneXpert MRSA Assay (FDA     approved for NASAL specimens     only), is one component of a     comprehensive MRSA colonization     surveillance program. It is not      intended to diagnose MRSA     infection nor to guide or     monitor treatment for     MRSA infections.     RESULT CALLED TO, READ BACK BY AND VERIFIED WITH:     MILFORD,J RN 03/30/13 1347 WOOTEN,K  URINE CULTURE     Status: None   Collection Time    04/01/13  8:49 AM      Result Value Range Status   Specimen Description URINE, CATHETERIZED   Final   Special Requests NONE   Final   Culture  Setup Time 04/01/2013 09:49   Final   Colony Count >=100,000 COLONIES/ML   Final   Culture ESCHERICHIA COLI   Final   Report Status 04/03/2013 FINAL   Final   Organism ID, Bacteria ESCHERICHIA COLI   Final  CULTURE, BLOOD (ROUTINE X 2)     Status: None   Collection Time    04/01/13 10:20 AM      Result Value Range Status   Specimen Description BLOOD RIGHT HAND   Final   Special Requests BOTTLES DRAWN AEROBIC ONLY 5CC   Final   Culture  Setup Time 04/01/2013 18:28   Final   Culture     Final   Value: ESCHERICHIA COLI     Note: SUSCEPTIBILITIES PERFORMED ON PREVIOUS CULTURE WITHIN THE LAST 5 DAYS.     Note: Gram Stain Report Called to,Read Back By and Verified With: CHRISTINA AT 0645 04/02/13 BY SNOLO  Report Status 04/04/2013 FINAL   Final  CULTURE, BLOOD (ROUTINE X 2)     Status: None   Collection Time    04/01/13 10:30 AM      Result Value Range Status   Specimen Description BLOOD RIGHT HAND   Final   Special Requests BOTTLES DRAWN AEROBIC ONLY 5CC   Final   Culture  Setup Time 04/01/2013 18:27   Final   Culture     Final   Value: ESCHERICHIA COLI     Note: Gram Stain Report Called to,Read Back By and Verified With: [facility] AT 0647 04/02/13 BY SNOLO   Report Status 04/04/2013 FINAL   Final   Organism ID, Bacteria ESCHERICHIA COLI   Final     Labs: Results for orders placed during the hospital encounter of 03/29/13  MRSA PCR SCREENING      Result Value Range   MRSA by PCR POSITIVE (*) NEGATIVE  CULTURE, BLOOD (ROUTINE X 2)      Result Value Range   Specimen Description BLOOD  RIGHT HAND     Special Requests BOTTLES DRAWN AEROBIC ONLY 5CC     Culture  Setup Time 04/01/2013 18:28     Culture       Value: ESCHERICHIA COLI     Note: SUSCEPTIBILITIES PERFORMED ON PREVIOUS CULTURE WITHIN THE LAST 5 DAYS.     Note: Gram Stain Report Called to,Read Back By and Verified With: CHRISTINA AT 0645 04/02/13 BY SNOLO   Report Status 04/04/2013 FINAL    CULTURE, BLOOD (ROUTINE X 2)      Result Value Range   Specimen Description BLOOD RIGHT HAND     Special Requests BOTTLES DRAWN AEROBIC ONLY 5CC     Culture  Setup Time 04/01/2013 18:27     Culture       Value: ESCHERICHIA COLI     Note: Gram Stain Report Called to,Read Back By and Verified With: [facility] AT 0647 04/02/13 BY SNOLO   Report Status 04/04/2013 FINAL     Organism ID, Bacteria ESCHERICHIA COLI    URINE CULTURE      Result Value Range   Specimen Description URINE, CATHETERIZED     Special Requests NONE     Culture  Setup Time 04/01/2013 09:49     Colony Count >=100,000 COLONIES/ML     Culture ESCHERICHIA COLI     Report Status 04/03/2013 FINAL     Organism ID, Bacteria ESCHERICHIA COLI    CBC      Result Value Range   WBC 5.8  4.0 - 10.5 K/uL   RBC 3.35 (*) 3.87 - 5.11 MIL/uL   Hemoglobin 11.2 (*) 12.0 - 15.0 g/dL   HCT 56.2 (*) 13.0 - 86.5 %   MCV 94.3  78.0 - 100.0 fL   MCH 33.4  26.0 - 34.0 pg   MCHC 35.4  30.0 - 36.0 g/dL   RDW 78.4  69.6 - 29.5 %   Platelets 182  150 - 400 K/uL  COMPREHENSIVE METABOLIC PANEL      Result Value Range   Sodium 126 (*) 135 - 145 mEq/L   Potassium 4.7  3.5 - 5.1 mEq/L   Chloride 91 (*) 96 - 112 mEq/L   CO2 21  19 - 32 mEq/L   Glucose, Bld 139 (*) 70 - 99 mg/dL   BUN 25 (*) 6 - 23 mg/dL   Creatinine, Ser 2.84  0.50 - 1.10 mg/dL   Calcium 8.8  8.4 - 13.2 mg/dL  Total Protein 7.6  6.0 - 8.3 g/dL   Albumin 3.4 (*) 3.5 - 5.2 g/dL   AST 440 (*) 0 - 37 U/L   ALT 130 (*) 0 - 35 U/L   Alkaline Phosphatase 151 (*) 39 - 117 U/L   Total Bilirubin 1.1  0.3 - 1.2 mg/dL    GFR calc non Af Amer 49 (*) >90 mL/min   GFR calc Af Amer 57 (*) >90 mL/min  URINALYSIS, ROUTINE W REFLEX MICROSCOPIC      Result Value Range   Color, Urine AMBER (*) YELLOW   APPearance CLEAR  CLEAR   Specific Gravity, Urine 1.017  1.005 - 1.030   pH 5.5  5.0 - 8.0   Glucose, UA NEGATIVE  NEGATIVE mg/dL   Hgb urine dipstick NEGATIVE  NEGATIVE   Bilirubin Urine SMALL (*) NEGATIVE   Ketones, ur 15 (*) NEGATIVE mg/dL   Protein, ur NEGATIVE  NEGATIVE mg/dL   Urobilinogen, UA 1.0  0.0 - 1.0 mg/dL   Nitrite NEGATIVE  NEGATIVE   Leukocytes, UA SMALL (*) NEGATIVE  GLUCOSE, CAPILLARY      Result Value Range   Glucose-Capillary 136 (*) 70 - 99 mg/dL  BILIRUBIN, DIRECT      Result Value Range   Bilirubin, Direct 1.4 (*) 0.0 - 0.3 mg/dL  URINE MICROSCOPIC-ADD ON      Result Value Range   Squamous Epithelial / LPF FEW (*) RARE   WBC, UA 0-2  <3 WBC/hpf   RBC / HPF 0-2  <3 RBC/hpf   Bacteria, UA FEW (*) RARE   Casts HYALINE CASTS (*) NEGATIVE  LIPASE, BLOOD      Result Value Range   Lipase 7 (*) 11 - 59 U/L  CORTISOL      Result Value Range   Cortisol, Plasma 23.0    TSH      Result Value Range   TSH 1.327  0.350 - 4.500 uIU/mL  OSMOLALITY, URINE      Result Value Range   Osmolality, Ur 350 (*) 390 - 1090 mOsm/kg  OSMOLALITY      Result Value Range   Osmolality 278  275 - 300 mOsm/kg  VITAMIN B12      Result Value Range   Vitamin B-12 580  211 - 911 pg/mL  FOLATE RBC      Result Value Range   RBC Folate 1079 (*) >=366 ng/mL  CK TOTAL AND CKMB      Result Value Range   Total CK 465 (*) 7 - 177 U/L   CK, MB 8.6 (*) 0.3 - 4.0 ng/mL   Relative Index 1.8  0.0 - 2.5  TROPONIN I      Result Value Range   Troponin I <0.30  <0.30 ng/mL  VALPROIC ACID LEVEL      Result Value Range   Valproic Acid Lvl <10.0 (*) 50.0 - 100.0 ug/mL  CBC      Result Value Range   WBC 13.2 (*) 4.0 - 10.5 K/uL   RBC 3.02 (*) 3.87 - 5.11 MIL/uL   Hemoglobin 10.2 (*) 12.0 - 15.0 g/dL   HCT 10.2 (*)  72.5 - 46.0 %   MCV 94.0  78.0 - 100.0 fL   MCH 33.8  26.0 - 34.0 pg   MCHC 35.9  30.0 - 36.0 g/dL   RDW 36.6  44.0 - 34.7 %   Platelets 169  150 - 400 K/uL  CREATININE, SERUM      Result Value Range  Creatinine, Ser 0.95  0.50 - 1.10 mg/dL   GFR calc non Af Amer 54 (*) >90 mL/min   GFR calc Af Amer 63 (*) >90 mL/min  RPR      Result Value Range   RPR NON REACTIVE  NON REACTIVE  HEMOGLOBIN A1C      Result Value Range   Hemoglobin A1C 6.2 (*) <5.7 %   Mean Plasma Glucose 131 (*) <117 mg/dL  GLUCOSE, CAPILLARY      Result Value Range   Glucose-Capillary 171 (*) 70 - 99 mg/dL  TROPONIN I      Result Value Range   Troponin I <0.30  <0.30 ng/mL  TROPONIN I      Result Value Range   Troponin I <0.30  <0.30 ng/mL  GLUCOSE, CAPILLARY      Result Value Range   Glucose-Capillary 99  70 - 99 mg/dL   Comment 1 Notify RN    CBC      Result Value Range   WBC 7.5  4.0 - 10.5 K/uL   RBC 2.73 (*) 3.87 - 5.11 MIL/uL   Hemoglobin 9.0 (*) 12.0 - 15.0 g/dL   HCT 16.1 (*) 09.6 - 04.5 %   MCV 94.5  78.0 - 100.0 fL   MCH 33.0  26.0 - 34.0 pg   MCHC 34.9  30.0 - 36.0 g/dL   RDW 40.9  81.1 - 91.4 %   Platelets 127 (*) 150 - 400 K/uL  COMPREHENSIVE METABOLIC PANEL      Result Value Range   Sodium 130 (*) 135 - 145 mEq/L   Potassium 4.6  3.5 - 5.1 mEq/L   Chloride 100  96 - 112 mEq/L   CO2 23  19 - 32 mEq/L   Glucose, Bld 98  70 - 99 mg/dL   BUN 22  6 - 23 mg/dL   Creatinine, Ser 7.82  0.50 - 1.10 mg/dL   Calcium 8.1 (*) 8.4 - 10.5 mg/dL   Total Protein 6.1  6.0 - 8.3 g/dL   Albumin 2.6 (*) 3.5 - 5.2 g/dL   AST 73 (*) 0 - 37 U/L   ALT 69 (*) 0 - 35 U/L   Alkaline Phosphatase 96  39 - 117 U/L   Total Bilirubin 1.9 (*) 0.3 - 1.2 mg/dL   GFR calc non Af Amer 54 (*) >90 mL/min   GFR calc Af Amer 62 (*) >90 mL/min  GLUCOSE, CAPILLARY      Result Value Range   Glucose-Capillary 84  70 - 99 mg/dL   Comment 1 Notify RN    GLUCOSE, CAPILLARY      Result Value Range   Glucose-Capillary  92  70 - 99 mg/dL  BASIC METABOLIC PANEL      Result Value Range   Sodium 131 (*) 135 - 145 mEq/L   Potassium 3.7  3.5 - 5.1 mEq/L   Chloride 100  96 - 112 mEq/L   CO2 16 (*) 19 - 32 mEq/L   Glucose, Bld 135 (*) 70 - 99 mg/dL   BUN 29 (*) 6 - 23 mg/dL   Creatinine, Ser 9.56 (*) 0.50 - 1.10 mg/dL   Calcium 7.9 (*) 8.4 - 10.5 mg/dL   GFR calc non Af Amer 39 (*) >90 mL/min   GFR calc Af Amer 45 (*) >90 mL/min  GLUCOSE, CAPILLARY      Result Value Range   Glucose-Capillary 126 (*) 70 - 99 mg/dL  GLUCOSE, CAPILLARY  Result Value Range   Glucose-Capillary 109 (*) 70 - 99 mg/dL  GLUCOSE, CAPILLARY      Result Value Range   Glucose-Capillary 198 (*) 70 - 99 mg/dL   Comment 1 Notify RN    GLUCOSE, CAPILLARY      Result Value Range   Glucose-Capillary 121 (*) 70 - 99 mg/dL   Comment 1 Notify RN    CARBAMAZEPINE LEVEL, TOTAL      Result Value Range   Carbamazepine Lvl 9.6  4.0 - 12.0 ug/mL  BASIC METABOLIC PANEL      Result Value Range   Sodium 132 (*) 135 - 145 mEq/L   Potassium 3.5  3.5 - 5.1 mEq/L   Chloride 100  96 - 112 mEq/L   CO2 19  19 - 32 mEq/L   Glucose, Bld 109 (*) 70 - 99 mg/dL   BUN 29 (*) 6 - 23 mg/dL   Creatinine, Ser 1.91 (*) 0.50 - 1.10 mg/dL   Calcium 8.4  8.4 - 47.8 mg/dL   GFR calc non Af Amer 35 (*) >90 mL/min   GFR calc Af Amer 40 (*) >90 mL/min  CBC      Result Value Range   WBC 6.8  4.0 - 10.5 K/uL   RBC 2.90 (*) 3.87 - 5.11 MIL/uL   Hemoglobin 9.7 (*) 12.0 - 15.0 g/dL   HCT 29.5 (*) 62.1 - 30.8 %   MCV 96.6  78.0 - 100.0 fL   MCH 33.4  26.0 - 34.0 pg   MCHC 34.6  30.0 - 36.0 g/dL   RDW 65.7  84.6 - 96.2 %   Platelets    150 - 400 K/uL   Value: PLATELET CLUMPS NOTED ON SMEAR, COUNT APPEARS DECREASED  URINALYSIS, ROUTINE W REFLEX MICROSCOPIC      Result Value Range   Color, Urine AMBER (*) YELLOW   APPearance TURBID (*) CLEAR   Specific Gravity, Urine 1.026  1.005 - 1.030   pH 5.0  5.0 - 8.0   Glucose, UA NEGATIVE  NEGATIVE mg/dL   Hgb  urine dipstick LARGE (*) NEGATIVE   Bilirubin Urine LARGE (*) NEGATIVE   Ketones, ur 15 (*) NEGATIVE mg/dL   Protein, ur 952 (*) NEGATIVE mg/dL   Urobilinogen, UA 2.0 (*) 0.0 - 1.0 mg/dL   Nitrite POSITIVE (*) NEGATIVE   Leukocytes, UA LARGE (*) NEGATIVE  URINE MICROSCOPIC-ADD ON      Result Value Range   Squamous Epithelial / LPF FEW (*) RARE   WBC, UA TOO NUMEROUS TO COUNT  <3 WBC/hpf   RBC / HPF 0-2  <3 RBC/hpf   Bacteria, UA FEW (*) RARE  GLUCOSE, CAPILLARY      Result Value Range   Glucose-Capillary 101 (*) 70 - 99 mg/dL  GLUCOSE, CAPILLARY      Result Value Range   Glucose-Capillary 146 (*) 70 - 99 mg/dL  BASIC METABOLIC PANEL      Result Value Range   Sodium 135  135 - 145 mEq/L   Potassium 3.9  3.5 - 5.1 mEq/L   Chloride 106  96 - 112 mEq/L   CO2 17 (*) 19 - 32 mEq/L   Glucose, Bld 131 (*) 70 - 99 mg/dL   BUN 37 (*) 6 - 23 mg/dL   Creatinine, Ser 8.41 (*) 0.50 - 1.10 mg/dL   Calcium 7.7 (*) 8.4 - 10.5 mg/dL   GFR calc non Af Amer 28 (*) >90 mL/min   GFR calc Af Amer 33 (*) >  90 mL/min  CBC WITH DIFFERENTIAL      Result Value Range   WBC 13.0 (*) 4.0 - 10.5 K/uL   RBC 2.37 (*) 3.87 - 5.11 MIL/uL   Hemoglobin 8.0 (*) 12.0 - 15.0 g/dL   HCT 16.1 (*) 09.6 - 04.5 %   MCV 96.2  78.0 - 100.0 fL   MCH 33.8  26.0 - 34.0 pg   MCHC 35.1  30.0 - 36.0 g/dL   RDW 40.9  81.1 - 91.4 %   Platelets 83 (*) 150 - 400 K/uL   Neutrophils Relative 85 (*) 43 - 77 %   Neutro Abs 11.1 (*) 1.7 - 7.7 K/uL   Lymphocytes Relative 8 (*) 12 - 46 %   Lymphs Abs 1.1  0.7 - 4.0 K/uL   Monocytes Relative 6  3 - 12 %   Monocytes Absolute 0.8  0.1 - 1.0 K/uL   Eosinophils Relative 0  0 - 5 %   Eosinophils Absolute 0.0  0.0 - 0.7 K/uL   Basophils Relative 0  0 - 1 %   Basophils Absolute 0.0  0.0 - 0.1 K/uL  GLUCOSE, CAPILLARY      Result Value Range   Glucose-Capillary 134 (*) 70 - 99 mg/dL  GLUCOSE, CAPILLARY      Result Value Range   Glucose-Capillary 126 (*) 70 - 99 mg/dL   Comment 1  Notify RN    OCCULT BLOOD X 1 CARD TO LAB, STOOL      Result Value Range   Fecal Occult Bld NEGATIVE  NEGATIVE  GLUCOSE, CAPILLARY      Result Value Range   Glucose-Capillary 144 (*) 70 - 99 mg/dL   Comment 1 Notify RN    CBC WITH DIFFERENTIAL      Result Value Range   WBC 12.3 (*) 4.0 - 10.5 K/uL   RBC 2.45 (*) 3.87 - 5.11 MIL/uL   Hemoglobin 8.3 (*) 12.0 - 15.0 g/dL   HCT 78.2 (*) 95.6 - 21.3 %   MCV 94.7  78.0 - 100.0 fL   MCH 33.9  26.0 - 34.0 pg   MCHC 35.8  30.0 - 36.0 g/dL   RDW 08.6  57.8 - 46.9 %   Platelets 90 (*) 150 - 400 K/uL   Neutrophils Relative 87 (*) 43 - 77 %   Neutro Abs 10.7 (*) 1.7 - 7.7 K/uL   Lymphocytes Relative 7 (*) 12 - 46 %   Lymphs Abs 0.8  0.7 - 4.0 K/uL   Monocytes Relative 6  3 - 12 %   Monocytes Absolute 0.7  0.1 - 1.0 K/uL   Eosinophils Relative 0  0 - 5 %   Eosinophils Absolute 0.0  0.0 - 0.7 K/uL   Basophils Relative 0  0 - 1 %   Basophils Absolute 0.0  0.0 - 0.1 K/uL  GLUCOSE, CAPILLARY      Result Value Range   Glucose-Capillary 199 (*) 70 - 99 mg/dL   Comment 1 Notify RN    CBC WITH DIFFERENTIAL      Result Value Range   WBC 10.1  4.0 - 10.5 K/uL   RBC 2.29 (*) 3.87 - 5.11 MIL/uL   Hemoglobin 7.6 (*) 12.0 - 15.0 g/dL   HCT 62.9 (*) 52.8 - 41.3 %   MCV 95.2  78.0 - 100.0 fL   MCH 33.2  26.0 - 34.0 pg   MCHC 34.9  30.0 - 36.0 g/dL   RDW 24.4  01.0 -  15.5 %   Platelets 96 (*) 150 - 400 K/uL   Neutrophils Relative 89 (*) 43 - 77 %   Neutro Abs 9.0 (*) 1.7 - 7.7 K/uL   Lymphocytes Relative 6 (*) 12 - 46 %   Lymphs Abs 0.6 (*) 0.7 - 4.0 K/uL   Monocytes Relative 5  3 - 12 %   Monocytes Absolute 0.5  0.1 - 1.0 K/uL   Eosinophils Relative 0  0 - 5 %   Eosinophils Absolute 0.0  0.0 - 0.7 K/uL   Basophils Relative 0  0 - 1 %   Basophils Absolute 0.0  0.0 - 0.1 K/uL  BASIC METABOLIC PANEL      Result Value Range   Sodium 136  135 - 145 mEq/L   Potassium 3.6  3.5 - 5.1 mEq/L   Chloride 106  96 - 112 mEq/L   CO2 18 (*) 19 - 32  mEq/L   Glucose, Bld 155 (*) 70 - 99 mg/dL   BUN 46 (*) 6 - 23 mg/dL   Creatinine, Ser 5.78 (*) 0.50 - 1.10 mg/dL   Calcium 7.9 (*) 8.4 - 10.5 mg/dL   GFR calc non Af Amer 35 (*) >90 mL/min   GFR calc Af Amer 41 (*) >90 mL/min  GLUCOSE, CAPILLARY      Result Value Range   Glucose-Capillary 137 (*) 70 - 99 mg/dL  GLUCOSE, CAPILLARY      Result Value Range   Glucose-Capillary 147 (*) 70 - 99 mg/dL  GLUCOSE, CAPILLARY      Result Value Range   Glucose-Capillary 131 (*) 70 - 99 mg/dL  BASIC METABOLIC PANEL      Result Value Range   Sodium 136  135 - 145 mEq/L   Potassium 3.6  3.5 - 5.1 mEq/L   Chloride 106  96 - 112 mEq/L   CO2 18 (*) 19 - 32 mEq/L   Glucose, Bld 151 (*) 70 - 99 mg/dL   BUN 44 (*) 6 - 23 mg/dL   Creatinine, Ser 4.69  0.50 - 1.10 mg/dL   Calcium 8.2 (*) 8.4 - 10.5 mg/dL   GFR calc non Af Amer 46 (*) >90 mL/min   GFR calc Af Amer 53 (*) >90 mL/min  CBC WITH DIFFERENTIAL      Result Value Range   WBC 10.0  4.0 - 10.5 K/uL   RBC 3.25 (*) 3.87 - 5.11 MIL/uL   Hemoglobin 10.4 (*) 12.0 - 15.0 g/dL   HCT 62.9 (*) 52.8 - 41.3 %   MCV 90.2  78.0 - 100.0 fL   MCH 32.0  26.0 - 34.0 pg   MCHC 35.5  30.0 - 36.0 g/dL   RDW 24.4 (*) 01.0 - 27.2 %   Platelets 99 (*) 150 - 400 K/uL   Neutrophils Relative 80 (*) 43 - 77 %   Neutro Abs 8.0 (*) 1.7 - 7.7 K/uL   Lymphocytes Relative 11 (*) 12 - 46 %   Lymphs Abs 1.1  0.7 - 4.0 K/uL   Monocytes Relative 9  3 - 12 %   Monocytes Absolute 0.9  0.1 - 1.0 K/uL   Eosinophils Relative 0  0 - 5 %   Eosinophils Absolute 0.0  0.0 - 0.7 K/uL   Basophils Relative 0  0 - 1 %   Basophils Absolute 0.0  0.0 - 0.1 K/uL  GLUCOSE, CAPILLARY      Result Value Range   Glucose-Capillary 154 (*) 70 - 99 mg/dL  GLUCOSE, CAPILLARY      Result Value Range   Glucose-Capillary 138 (*) 70 - 99 mg/dL  GLUCOSE, CAPILLARY      Result Value Range   Glucose-Capillary 152 (*) 70 - 99 mg/dL  CARBAMAZEPINE LEVEL, TOTAL      Result Value Range    Carbamazepine Lvl 10.4  4.0 - 12.0 ug/mL  BASIC METABOLIC PANEL      Result Value Range   Sodium 136  135 - 145 mEq/L   Potassium 3.7  3.5 - 5.1 mEq/L   Chloride 107  96 - 112 mEq/L   CO2 18 (*) 19 - 32 mEq/L   Glucose, Bld 156 (*) 70 - 99 mg/dL   BUN 43 (*) 6 - 23 mg/dL   Creatinine, Ser 7.82 (*) 0.50 - 1.10 mg/dL   Calcium 8.2 (*) 8.4 - 10.5 mg/dL   GFR calc non Af Amer 41 (*) >90 mL/min   GFR calc Af Amer 47 (*) >90 mL/min  CBC WITH DIFFERENTIAL      Result Value Range   WBC 9.5  4.0 - 10.5 K/uL   RBC 3.20 (*) 3.87 - 5.11 MIL/uL   Hemoglobin 10.3 (*) 12.0 - 15.0 g/dL   HCT 95.6 (*) 21.3 - 08.6 %   MCV 90.3  78.0 - 100.0 fL   MCH 32.2  26.0 - 34.0 pg   MCHC 35.6  30.0 - 36.0 g/dL   RDW 57.8 (*) 46.9 - 62.9 %   Platelets 96 (*) 150 - 400 K/uL   Neutrophils Relative 80 (*) 43 - 77 %   Lymphocytes Relative 9 (*) 12 - 46 %   Monocytes Relative 10  3 - 12 %   Eosinophils Relative 1  0 - 5 %   Basophils Relative 0  0 - 1 %   Neutro Abs 7.5  1.7 - 7.7 K/uL   Lymphs Abs 0.9  0.7 - 4.0 K/uL   Monocytes Absolute 1.0  0.1 - 1.0 K/uL   Eosinophils Absolute 0.1  0.0 - 0.7 K/uL   Basophils Absolute 0.0  0.0 - 0.1 K/uL   WBC Morphology TOXIC GRANULATION    GLUCOSE, CAPILLARY      Result Value Range   Glucose-Capillary 145 (*) 70 - 99 mg/dL  GLUCOSE, CAPILLARY      Result Value Range   Glucose-Capillary 173 (*) 70 - 99 mg/dL   Comment 1 Notify RN    GLUCOSE, CAPILLARY      Result Value Range   Glucose-Capillary 156 (*) 70 - 99 mg/dL  GLUCOSE, CAPILLARY      Result Value Range   Glucose-Capillary 151 (*) 70 - 99 mg/dL  GLUCOSE, CAPILLARY      Result Value Range   Glucose-Capillary 104 (*) 70 - 99 mg/dL  GLUCOSE, CAPILLARY      Result Value Range   Glucose-Capillary 160 (*) 70 - 99 mg/dL  GLUCOSE, CAPILLARY      Result Value Range   Glucose-Capillary 113 (*) 70 - 99 mg/dL  GLUCOSE, CAPILLARY      Result Value Range   Glucose-Capillary 178 (*) 70 - 99 mg/dL  GLUCOSE,  CAPILLARY      Result Value Range   Glucose-Capillary 125 (*) 70 - 99 mg/dL  GLUCOSE, CAPILLARY      Result Value Range   Glucose-Capillary 124 (*) 70 - 99 mg/dL  POCT I-STAT TROPONIN I      Result Value Range   Troponin i, poc 0.00  0.00 -  0.08 ng/mL   Comment 3           POCT I-STAT TROPONIN I      Result Value Range   Troponin i, poc 0.02  0.00 - 0.08 ng/mL   Comment 3           PREPARE RBC (CROSSMATCH)      Result Value Range   Order Confirmation ORDER PROCESSED BY BLOOD BANK    TYPE AND SCREEN      Result Value Range   ABO/RH(D) A POS     Antibody Screen NEG     Sample Expiration 04/06/2013     Unit Number [id number]     Blood Component Type RED CELLS,LR     Unit division 00     Status of Unit ISSUED,FINAL     Transfusion Status OK TO TRANSFUSE     Crossmatch Result Compatible     Unit Number [id number]     Blood Component Type RED CELLS,LR     Unit division 00     Status of Unit ISSUED,FINAL     Transfusion Status OK TO TRANSFUSE     Crossmatch Result Compatible      Time coordinating discharge: 40 minutes Signed: Pearla Dubonnet, MD 04/07/2013, 8:36 AM

## 2013-04-18 ENCOUNTER — Encounter (HOSPITAL_COMMUNITY): Payer: Self-pay | Admitting: *Deleted

## 2013-04-18 ENCOUNTER — Inpatient Hospital Stay (HOSPITAL_COMMUNITY)
Admission: EM | Admit: 2013-04-18 | Discharge: 2013-04-21 | DRG: 293 | Disposition: A | Discharge status: Skilled Nursing Facility | Payer: Medicare Other | Attending: Internal Medicine | Admitting: Internal Medicine

## 2013-04-18 ENCOUNTER — Emergency Department (HOSPITAL_COMMUNITY): Payer: Medicare Other

## 2013-04-18 DIAGNOSIS — F0150 Vascular dementia without behavioral disturbance: Secondary | ICD-10-CM | POA: Diagnosis present

## 2013-04-18 DIAGNOSIS — I10 Essential (primary) hypertension: Secondary | ICD-10-CM | POA: Diagnosis present

## 2013-04-18 DIAGNOSIS — R4182 Altered mental status, unspecified: Secondary | ICD-10-CM

## 2013-04-18 DIAGNOSIS — F411 Generalized anxiety disorder: Secondary | ICD-10-CM | POA: Diagnosis present

## 2013-04-18 DIAGNOSIS — Z8673 Personal history of transient ischemic attack (TIA), and cerebral infarction without residual deficits: Secondary | ICD-10-CM

## 2013-04-18 DIAGNOSIS — G40909 Epilepsy, unspecified, not intractable, without status epilepticus: Secondary | ICD-10-CM

## 2013-04-18 DIAGNOSIS — Z794 Long term (current) use of insulin: Secondary | ICD-10-CM

## 2013-04-18 DIAGNOSIS — I447 Left bundle-branch block, unspecified: Secondary | ICD-10-CM | POA: Diagnosis present

## 2013-04-18 DIAGNOSIS — I679 Cerebrovascular disease, unspecified: Secondary | ICD-10-CM | POA: Diagnosis present

## 2013-04-18 DIAGNOSIS — J810 Acute pulmonary edema: Secondary | ICD-10-CM

## 2013-04-18 DIAGNOSIS — Z79899 Other long term (current) drug therapy: Secondary | ICD-10-CM

## 2013-04-18 DIAGNOSIS — Z9181 History of falling: Secondary | ICD-10-CM

## 2013-04-18 DIAGNOSIS — E119 Type 2 diabetes mellitus without complications: Secondary | ICD-10-CM | POA: Diagnosis present

## 2013-04-18 DIAGNOSIS — K219 Gastro-esophageal reflux disease without esophagitis: Secondary | ICD-10-CM

## 2013-04-18 DIAGNOSIS — I160 Hypertensive urgency: Secondary | ICD-10-CM

## 2013-04-18 DIAGNOSIS — I672 Cerebral atherosclerosis: Secondary | ICD-10-CM | POA: Diagnosis present

## 2013-04-18 DIAGNOSIS — Z7902 Long term (current) use of antithrombotics/antiplatelets: Secondary | ICD-10-CM

## 2013-04-18 DIAGNOSIS — I252 Old myocardial infarction: Secondary | ICD-10-CM

## 2013-04-18 DIAGNOSIS — E785 Hyperlipidemia, unspecified: Secondary | ICD-10-CM | POA: Diagnosis present

## 2013-04-18 DIAGNOSIS — I509 Heart failure, unspecified: Principal | ICD-10-CM | POA: Diagnosis present

## 2013-04-18 DIAGNOSIS — I2510 Atherosclerotic heart disease of native coronary artery without angina pectoris: Secondary | ICD-10-CM | POA: Diagnosis present

## 2013-04-18 DIAGNOSIS — Z950 Presence of cardiac pacemaker: Secondary | ICD-10-CM

## 2013-04-18 HISTORY — DX: Acute pulmonary edema: J81.0

## 2013-04-18 HISTORY — DX: Vascular dementia, unspecified severity, without behavioral disturbance, psychotic disturbance, mood disturbance, and anxiety: F01.50

## 2013-04-18 LAB — CBC WITH DIFFERENTIAL/PLATELET
Eosinophils Absolute: 0.1 10*3/uL (ref 0.0–0.7)
Eosinophils Relative: 1 % (ref 0–5)
HCT: 30.7 % — ABNORMAL LOW (ref 36.0–46.0)
Hemoglobin: 10.1 g/dL — ABNORMAL LOW (ref 12.0–15.0)
Lymphocytes Relative: 25 % (ref 12–46)
Lymphs Abs: 2.2 10*3/uL (ref 0.7–4.0)
MCH: 31.8 pg (ref 26.0–34.0)
MCV: 96.5 fL (ref 78.0–100.0)
Monocytes Relative: 10 % (ref 3–12)
Platelets: 428 10*3/uL — ABNORMAL HIGH (ref 150–400)
RBC: 3.18 MIL/uL — ABNORMAL LOW (ref 3.87–5.11)
WBC: 9 10*3/uL (ref 4.0–10.5)

## 2013-04-18 LAB — POCT I-STAT, CHEM 8
Chloride: 101 mEq/L (ref 96–112)
Creatinine, Ser: 0.7 mg/dL (ref 0.50–1.10)
Glucose, Bld: 186 mg/dL — ABNORMAL HIGH (ref 70–99)
HCT: 31 % — ABNORMAL LOW (ref 36.0–46.0)
Hemoglobin: 10.5 g/dL — ABNORMAL LOW (ref 12.0–15.0)
Potassium: 3.3 mEq/L — ABNORMAL LOW (ref 3.5–5.1)
Sodium: 139 mEq/L (ref 135–145)

## 2013-04-18 LAB — GLUCOSE, CAPILLARY
Glucose-Capillary: 210 mg/dL — ABNORMAL HIGH (ref 70–99)
Glucose-Capillary: 90 mg/dL (ref 70–99)
Glucose-Capillary: 101 mg/dL — ABNORMAL HIGH (ref 70–99)

## 2013-04-18 LAB — PRO B NATRIURETIC PEPTIDE: Pro B Natriuretic peptide (BNP): 6172 pg/mL — ABNORMAL HIGH (ref 0–450)

## 2013-04-18 LAB — SAMPLE TO BLOOD BANK

## 2013-04-18 LAB — TROPONIN I: Troponin I: <0.3 ng/mL (ref <0.30)

## 2013-04-18 LAB — HEMOGLOBIN A1C: Hgb A1c MFr Bld: 5.7 % — ABNORMAL HIGH (ref <5.7)

## 2013-04-18 MED ORDER — ALPRAZOLAM 0.25 MG PO TABS: 0.0125 mg | ORAL_TABLET | Freq: Three times a day (TID) | ORAL | Status: DC | PRN

## 2013-04-18 MED ORDER — GLYBURIDE 2.5 MG PO TABS
2.5000 mg | ORAL_TABLET | Freq: Every day | ORAL | Status: DC
  Administered 2013-04-19 – 2013-04-21 (×3): 2.5 mg via ORAL
  Filled 2013-04-18 (×6): qty 1

## 2013-04-18 MED ORDER — HEPARIN SODIUM (PORCINE) 5000 UNIT/ML IJ SOLN
5000.0000 [IU] | Freq: Three times a day (TID) | INTRAMUSCULAR | Status: DC
  Administered 2013-04-18 – 2013-04-21 (×11): 5000 [IU] via SUBCUTANEOUS
  Filled 2013-04-18 (×13): qty 1

## 2013-04-18 MED ORDER — METOPROLOL SUCCINATE ER 25 MG PO TB24
25.0000 mg | ORAL_TABLET | Freq: Two times a day (BID) | ORAL | Status: DC
  Administered 2013-04-18 – 2013-04-21 (×7): 25 mg via ORAL
  Filled 2013-04-18 (×8): qty 1

## 2013-04-18 MED ORDER — FUROSEMIDE 10 MG/ML IJ SOLN
40.0000 mg | Freq: Once | INTRAMUSCULAR | Status: AC
  Administered 2013-04-18: 40 mg via INTRAVENOUS
  Filled 2013-04-18: qty 4

## 2013-04-18 MED ORDER — MOMETASONE FUROATE 0.1 % EX CREA
TOPICAL_CREAM | Freq: Every day | CUTANEOUS | Status: DC | PRN
  Filled 2013-04-18: qty 15

## 2013-04-18 MED ORDER — NITROGLYCERIN 0.4 MG SL SUBL: 0.4000 mg | SUBLINGUAL_TABLET | SUBLINGUAL | Status: DC | PRN

## 2013-04-18 MED ORDER — SIMVASTATIN 20 MG PO TABS
20.0000 mg | ORAL_TABLET | Freq: Every day | ORAL | Status: DC
  Administered 2013-04-18 – 2013-04-20 (×3): 20 mg via ORAL
  Filled 2013-04-18 (×4): qty 1

## 2013-04-18 MED ORDER — ALPRAZOLAM 0.25 MG PO TABS: 0.1250 mg | ORAL_TABLET | Freq: Three times a day (TID) | ORAL | Status: DC | PRN

## 2013-04-18 MED ORDER — FUROSEMIDE 10 MG/ML IJ SOLN
40.0000 mg | Freq: Every day | INTRAMUSCULAR | Status: DC
  Administered 2013-04-18 – 2013-04-19 (×2): 40 mg via INTRAVENOUS
  Filled 2013-04-18 (×3): qty 4

## 2013-04-18 MED ORDER — SODIUM CHLORIDE 0.9 % IJ SOLN
3.0000 mL | Freq: Two times a day (BID) | INTRAMUSCULAR | Status: DC
  Administered 2013-04-18 – 2013-04-21 (×7): 3 mL via INTRAVENOUS

## 2013-04-18 MED ORDER — VITAMIN D3 25 MCG (1000 UNIT) PO TABS
1000.0000 [IU] | ORAL_TABLET | Freq: Two times a day (BID) | ORAL | Status: DC
  Administered 2013-04-18 – 2013-04-21 (×7): 1000 [IU] via ORAL
  Filled 2013-04-18 (×9): qty 1

## 2013-04-18 MED ORDER — INSULIN ASPART 100 UNIT/ML ~~LOC~~ SOLN
0.0000 [IU] | Freq: Three times a day (TID) | SUBCUTANEOUS | Status: DC
  Administered 2013-04-18: 5 [IU] via SUBCUTANEOUS
  Administered 2013-04-19: 2 [IU] via SUBCUTANEOUS
  Administered 2013-04-20: 3 [IU] via SUBCUTANEOUS

## 2013-04-18 MED ORDER — CARBAMAZEPINE 200 MG PO TABS
400.0000 mg | ORAL_TABLET | Freq: Two times a day (BID) | ORAL | Status: DC
  Administered 2013-04-18 – 2013-04-21 (×7): 400 mg via ORAL
  Filled 2013-04-18 (×8): qty 2

## 2013-04-18 MED ORDER — IRBESARTAN 75 MG PO TABS
75.0000 mg | ORAL_TABLET | Freq: Every day | ORAL | Status: DC
  Administered 2013-04-18 – 2013-04-21 (×4): 75 mg via ORAL
  Filled 2013-04-18 (×4): qty 1

## 2013-04-18 MED ORDER — INSULIN ASPART 100 UNIT/ML ~~LOC~~ SOLN: 0.0000 [IU] | Freq: Three times a day (TID) | SUBCUTANEOUS | Status: DC

## 2013-04-18 MED ORDER — POTASSIUM CHLORIDE CRYS ER 20 MEQ PO TBCR
40.0000 meq | EXTENDED_RELEASE_TABLET | Freq: Once | ORAL | Status: AC
  Administered 2013-04-18: 40 meq via ORAL
  Filled 2013-04-18: qty 2

## 2013-04-18 MED ORDER — NITROGLYCERIN IN D5W 200-5 MCG/ML-% IV SOLN
5.0000 ug/min | INTRAVENOUS | Status: DC
  Administered 2013-04-18: 5 ug/min via INTRAVENOUS
  Filled 2013-04-18: qty 250

## 2013-04-18 MED ORDER — MOMETASONE FUROATE 0.1 % EX OINT: 1.0000 "application " | TOPICAL_OINTMENT | Freq: Every day | CUTANEOUS | Status: DC

## 2013-04-18 MED ORDER — ISOSORBIDE MONONITRATE ER 60 MG PO TB24
60.0000 mg | ORAL_TABLET | Freq: Every day | ORAL | Status: DC
  Administered 2013-04-18 – 2013-04-21 (×4): 60 mg via ORAL
  Filled 2013-04-18 (×4): qty 1

## 2013-04-18 MED ORDER — CLOPIDOGREL BISULFATE 75 MG PO TABS
75.0000 mg | ORAL_TABLET | Freq: Every day | ORAL | Status: DC
  Administered 2013-04-18 – 2013-04-21 (×4): 75 mg via ORAL
  Filled 2013-04-18 (×4): qty 1

## 2013-04-18 MED ORDER — PANTOPRAZOLE SODIUM 40 MG PO TBEC
40.0000 mg | DELAYED_RELEASE_TABLET | Freq: Every day | ORAL | Status: DC
  Administered 2013-04-18 – 2013-04-21 (×4): 40 mg via ORAL
  Filled 2013-04-18 (×3): qty 1

## 2013-04-18 MED ORDER — ASPIRIN EC 325 MG PO TBEC
325.0000 mg | DELAYED_RELEASE_TABLET | Freq: Every day | ORAL | Status: DC
  Administered 2013-04-18 – 2013-04-21 (×4): 325 mg via ORAL
  Filled 2013-04-18 (×6): qty 1

## 2013-04-18 MED ORDER — INSULIN ASPART 100 UNIT/ML ~~LOC~~ SOLN: 0.0000 [IU] | Freq: Every day | SUBCUTANEOUS | Status: DC

## 2013-04-18 NOTE — Progress Notes (Signed)
Utilization Review Completed Thresia Ramanathan J. Caterra Ostroff, RN, BSN, NCM [phone]  

## 2013-04-18 NOTE — ED Provider Notes (Signed)
History     CSN: [id number]  Arrival date & time 04/18/13  0454   First MD Initiated Contact with Patient 04/18/13 0455      Chief Complaint  Patient presents with  . Shortness of Breath    (Consider location/radiation/quality/duration/timing/severity/associated sxs/prior treatment) HPI 79 year old female presents to emergency room from her nursing home via EMS with complaint of shortness of breath.  No prior history of COPD or asthma.  Patient recently discharged from the hospital after syncope, altered mental status, and urinary tract infection.  She denies any chest pain, nausea, diaphoresis.  Patient seems confused, and is unsure why she is here.  EMS gave 2 albuterol and 1 Atrovent neb in route.  Initial sats were 92% on room air.  No cough, no h/o chf, but does have CAD with pacemaker.  No leg pain or swelling.  Paperwork from NH shows recent drop in h/h, taken off plavix and on only baby aspirin, suspected to be lower gi bleed.  Past Medical History  Diagnosis Date  . Hypertension   . Acid reflux   . Seizures   . Coronary artery disease 2003    60-75%D2, 50% D1, 25% left circ, 20% LAD, normal LVF  . Dyslipidemia   . LBBB (left bundle branch block)   . Confusion   . Pacemaker   . Fall at nursing home 03/29/2013  . Type II diabetes mellitus   . Myocardial infarction     "long time ago" (03/31/2013)  . Exertional shortness of breath     "once in awhile" (03/31/2013)  . Stroke     dennies residual 03/31/2013  . Anxiety     Past Surgical History  Procedure Laterality Date  . Cholecystectomy    . Vaginal hysterectomy    . Appendectomy    . Iridectomy    . Cardiac catheterization    . Eye surgery      cataracts/ iridectomy  . Tonsillectomy and adenoidectomy    . Mastoidectomy Right   . Cataract extraction w/ intraocular lens  implant, bilateral    . Dilation and curettage of uterus    . Tubal ligation    . Insert / replace / remove pacemaker  06/20/2012    No  family history on file.  History  Substance Use Topics  . Smoking status: Never Smoker   . Smokeless tobacco: Never Used  . Alcohol Use: No    OB History   Grav Para Term Preterm Abortions TAB SAB Ect Mult Living                  Review of Systems  Unable to perform ROS: Dementia    Allergies  Review of patient's allergies indicates no known allergies.  Home Medications   Current Outpatient Rx  Name  Route  Sig  Dispense  Refill  . ALPRAZolam (XANAX) 0.25 MG tablet   Oral   Take 0.0125 mg by mouth every 8 (eight) hours as needed for anxiety.         . metoprolol succinate (TOPROL-XL) 25 MG 24 hr tablet   Oral   Take 25 mg by mouth 2 (two) times daily.         . Alcaftadine (LASTACAFT) 0.25 % SOLN   Both Eyes   Place 1 drop into both eyes daily.          Marland Kitchen aspirin EC 325 MG tablet   Oral   Take 325 mg by mouth daily.         Marland Kitchen  carbamazepine (TEGRETOL) 200 MG tablet   Oral   Take 400 mg by mouth 2 (two) times daily.         . cholecalciferol (VITAMIN D) 1000 UNITS tablet   Oral   Take 1,000 Units by mouth 2 (two) times daily.         . clopidogrel (PLAVIX) 75 MG tablet   Oral   Take 75 mg by mouth daily.         Marland Kitchen glyBURIDE (DIABETA) 2.5 MG tablet   Oral   Take 2.5 mg by mouth every morning.          . insulin aspart (NOVOLOG) 100 UNIT/ML injection   Subcutaneous   Inject 0-9 Units into the skin 3 (three) times daily with meals.   1 vial   1   . isosorbide mononitrate (IMDUR) 30 MG 24 hr tablet   Oral   Take 60 mg by mouth daily.         . mometasone (ELOCON) 0.1 % ointment   Topical   Apply 1 application topically daily. Apply around and to skin of eyelids for 1 week courses when inflammation occurs, do not get this medication in the eyes         . nitroGLYCERIN (NITROSTAT) 0.4 MG SL tablet   Sublingual   Place 0.4 mg under the tongue every 5 (five) minutes x 3 doses as needed. For chest pain         . omeprazole  (PRILOSEC) 20 MG capsule   Oral   Take 20 mg by mouth daily before breakfast. Take on empty stomach at 630 am         . simvastatin (ZOCOR) 20 MG tablet   Oral   Take 20 mg by mouth at bedtime.         Marland Kitchen telmisartan (MICARDIS) 80 MG tablet   Oral   Take 80 mg by mouth daily.           BP 164/59  Pulse 74  Temp(Src) 98.5 F (36.9 C) (Oral)  Resp 17  SpO2 96%  Physical Exam  Nursing note and vitals reviewed. Constitutional: She appears well-developed. No distress.  Elderly, frail, slightly confused  HENT:  Head: Normocephalic and atraumatic.  Nose: Nose normal.  Mouth/Throat: Oropharynx is clear and moist.  Neck: Normal range of motion. Neck supple. No JVD present. No tracheal deviation present. No thyromegaly present.  Cardiovascular: Normal rate, regular rhythm, normal heart sounds and intact distal pulses.  Exam reveals no gallop and no friction rub.   No murmur heard. Pulmonary/Chest: No stridor. No respiratory distress. She has wheezes. She has rales. She exhibits no tenderness.  Abdominal: Soft. Bowel sounds are normal. She exhibits no distension and no mass. There is no tenderness. There is no rebound and no guarding.  Musculoskeletal: She exhibits edema. She exhibits no tenderness.  Lymphadenopathy:    She has no cervical adenopathy.  Neurological: She is alert. She exhibits normal muscle tone.  Oriented to self  Skin: Skin is warm and dry. No rash noted. No erythema. No pallor.  Psychiatric: She has a normal mood and affect. Her behavior is normal. Judgment and thought content normal.    ED Course  Procedures (including critical care time)  Labs Reviewed  PRO B NATRIURETIC PEPTIDE - Abnormal; Notable for the following:    Pro B Natriuretic peptide (BNP) 6172.0 (*)    All other components within normal limits  CBC WITH DIFFERENTIAL - Abnormal; Notable for  the following:    RBC 3.18 (*)    Hemoglobin 10.1 (*)    HCT 30.7 (*)    Platelets 428 (*)     All other components within normal limits  POCT I-STAT, CHEM 8 - Abnormal; Notable for the following:    Potassium 3.3 (*)    Glucose, Bld 186 (*)    Calcium, Ion 1.05 (*)    Hemoglobin 10.5 (*)    HCT 31.0 (*)    All other components within normal limits  TROPONIN I  SAMPLE TO BLOOD BANK   Dg Chest Port 1 View  04/18/2013   *RADIOLOGY REPORT*  Clinical Data: Shortness of breath  PORTABLE CHEST - 1 VIEW  Comparison: 04/10 09:14  Findings: Cardiomegaly.  Central vascular congestion.  Interval increased perihilar and bibasilar opacities.  Layering effusions not excluded.  Left chest wall battery pack with lead tips unchanged in position.  No pneumothorax.  No interval osseous change.  IMPRESSION: Interval vascular congestion and perihilar/infrahilar opacities; pulmonary edema (favored) versus multifocal infection.   Original Report Authenticated By: Jearld Lesch, M.D.     Date: 04/18/2013  Rate: 83  Rhythm: normal sinus rhythm  QRS Axis: normal  Intervals: normal  ST/T Wave abnormalities: normal  Conduction Disutrbances:left bundle branch block  Narrative Interpretation:   Old EKG Reviewed: unchanged   1. Acute pulmonary edema   2. Hypertensive urgency       MDM  79 year old female with acute onset of shortness of breath without prior history of lung disease.  Patient noted be hypertensive upon her arrival.  Concern for flash pulmonary edema./Hypertensive urgency, causing her symptoms.  She denies any chest pain.  No other endorgan damage.  She's not had significant improvement after 2 nebulized treatments.  Will check chest x-ray and laboratory.  She is maintaining her oxygen saturations, and I do not feel she needs BiPAP at this time.  Expect she'll need Lasix and nitroglycerin drip.  Will discuss with hospitalist for admission if she does prove to have pulmonary edema        Olivia Mackie, MD 04/18/13 [phone]

## 2013-04-18 NOTE — H&P (Signed)
[facility] History and Physical  [name] [name] ZOX:[id number] DOB: 1928-08-22 DOA: 04/18/2013  Referring physician: Emergency department PCP: Pearla Dubonnet, MD  Specialists:   Chief Complaint: "Why am I here?"  HPI: [name] [name] is a 79 y.o. pleasantly demented female  Reports to the emergency department from her nursing facility with acute onset shortness of breath. The patient is a very difficult historian and therefore the majority of the history is obtained from document record and from staff. The patient does have a history of CAD, diabetes, hypertension, hyperlipidemia. Given her to dyspnea, the patient was brought to the emergency department where she had radiologic signs of pulmonary pulmonary. Initial cardiac biomarkers were negative however the BNP was noted to be over 6000. This was given 40 mg of IV Lasix x1. Purplish remains on 3 L O2 nasal cannula. Patient denies any chest pain active shortness of breath or nausea.  Review of Systems: The patient denies anorexia, fever, weight loss,, vision loss, decreased hearing, hoarseness, chest pain, syncope, dyspnea on exertion, peripheral edema, balance deficits, hemoptysis, abdominal pain, melena, hematochezia, severe indigestion/heartburn, hematuria, incontinence, genital sores, muscle weakness, suspicious skin lesions, transient blindness, difficulty walking, depression, unusual weight change, abnormal bleeding, enlarged lymph nodes, angioedema, and breast masses.    Past Medical History  Diagnosis Date  . Hypertension   . Acid reflux   . Seizures   . Coronary artery disease 2003    60-75%D2, 50% D1, 25% left circ, 20% LAD, normal LVF  . Dyslipidemia   . LBBB (left bundle branch block)   . Confusion   . Pacemaker   . Fall at nursing home 03/29/2013  . Type II diabetes mellitus   . Myocardial infarction     "long time ago" (03/31/2013)  . Exertional shortness of breath     "once in awhile" (03/31/2013)  . Stroke      dennies residual 03/31/2013  . Anxiety   . Multi-infarct dementia    Past Surgical History  Procedure Laterality Date  . Cholecystectomy    . Vaginal hysterectomy    . Appendectomy    . Iridectomy    . Cardiac catheterization    . Eye surgery      cataracts/ iridectomy  . Tonsillectomy and adenoidectomy    . Mastoidectomy Right   . Cataract extraction w/ intraocular lens  implant, bilateral    . Dilation and curettage of uterus    . Tubal ligation    . Insert / replace / remove pacemaker  06/20/2012   Social History:  reports that she has never smoked. She has never used smokeless tobacco. She reports that she does not drink alcohol or use illicit drugs. Nursing where does patient live--home, ALF, SNF? and with whom if at home?   No Known Allergies  No family history on file.  (be sure to complete)  Prior to Admission medications   Medication Sig Start Date End Date Taking? Authorizing Provider  ALPRAZolam (XANAX) 0.25 MG tablet Take 0.0125 mg by mouth every 8 (eight) hours as needed for anxiety.   Yes Historical Provider, MD  metoprolol succinate (TOPROL-XL) 25 MG 24 hr tablet Take 25 mg by mouth 2 (two) times daily.   Yes Historical Provider, MD  Alcaftadine (LASTACAFT) 0.25 % SOLN Place 1 drop into both eyes daily.     Historical Provider, MD  aspirin EC 325 MG tablet Take 325 mg by mouth daily.    Historical Provider, MD  carbamazepine (TEGRETOL) 200 MG tablet Take 400 mg  by mouth 2 (two) times daily.    Historical Provider, MD  cholecalciferol (VITAMIN D) 1000 UNITS tablet Take 1,000 Units by mouth 2 (two) times daily.    Historical Provider, MD  clopidogrel (PLAVIX) 75 MG tablet Take 75 mg by mouth daily.    Historical Provider, MD  glyBURIDE (DIABETA) 2.5 MG tablet Take 2.5 mg by mouth every morning.     Historical Provider, MD  insulin aspart (NOVOLOG) 100 UNIT/ML injection Inject 0-9 Units into the skin 3 (three) times daily with meals. 04/07/13   Marden Noble, MD   isosorbide mononitrate (IMDUR) 30 MG 24 hr tablet Take 60 mg by mouth daily.    Historical Provider, MD  mometasone (ELOCON) 0.1 % ointment Apply 1 application topically daily. Apply around and to skin of eyelids for 1 week courses when inflammation occurs, do not get this medication in the eyes    Historical Provider, MD  nitroGLYCERIN (NITROSTAT) 0.4 MG SL tablet Place 0.4 mg under the tongue every 5 (five) minutes x 3 doses as needed. For chest pain    Historical Provider, MD  omeprazole (PRILOSEC) 20 MG capsule Take 20 mg by mouth daily before breakfast. Take on empty stomach at 630 am    Historical Provider, MD  simvastatin (ZOCOR) 20 MG tablet Take 20 mg by mouth at bedtime.    Historical Provider, MD  telmisartan (MICARDIS) 80 MG tablet Take 80 mg by mouth daily.    Historical Provider, MD   Physical Exam: Filed Vitals:   04/18/13 0630 04/18/13 0645 04/18/13 0700 04/18/13 0715  BP: 170/63 162/53 127/102 179/79  Pulse: 72 69 72 68  Temp:      TempSrc:      Resp: 18 21 15 16   SpO2: 98% 95% 96% 97%     General:  Patient's awake in no apparent distress  Eyes: Pupils Equal round reactive  ENT: He does members point  Neck: Trachea midline neck supple  Cardiovascular: Regular S1-S2 no murmur  Respiratory: Normal respiratory effort no crackles no wheezing  Abdomen: Soft nontender nondistended  Skin: No rashes or lesion  Musculoskeletal: We'll perfused distally no clubbing or cyanosis  Psychiatric: Evidence of short-term memory deficit  Neurologic: Nerves II through XII grossly intact draped sensation intact throughout  Labs on Admission:  Basic Metabolic Panel:  Recent Labs Lab 04/18/13 0544  NA 139  K 3.3*  CL 101  GLUCOSE 186*  BUN 7  CREATININE 0.70   Liver Function Tests: No results found for this basename: AST, ALT, ALKPHOS, BILITOT, PROT, ALBUMIN,  in the last 168 hours No results found for this basename: LIPASE, AMYLASE,  in the last 168 hours No  results found for this basename: AMMONIA,  in the last 168 hours CBC:  Recent Labs Lab 04/18/13 0512 04/18/13 0544  WBC 9.0  --   NEUTROABS 5.8  --   HGB 10.1* 10.5*  HCT 30.7* 31.0*  MCV 96.5  --   PLT 428*  --    Cardiac Enzymes:  Recent Labs Lab 04/18/13 0511  TROPONINI <0.30    BNP (last 3 results)  Recent Labs  04/18/13 0511  PROBNP 6172.0*   CBG: No results found for this basename: GLUCAP,  in the last 168 hours  Radiological Exams on Admission: Dg Chest Port 1 View  04/18/2013   *RADIOLOGY REPORT*  Clinical Data: Shortness of breath  PORTABLE CHEST - 1 VIEW  Comparison: 04/10 09:14  Findings: Cardiomegaly.  Central vascular congestion.  Interval increased  perihilar and bibasilar opacities.  Layering effusions not excluded.  Left chest wall battery pack with lead tips unchanged in position.  No pneumothorax.  No interval osseous change.  IMPRESSION: Interval vascular congestion and perihilar/infrahilar opacities; pulmonary edema (favored) versus multifocal infection.   Original Report Authenticated By: Jearld Lesch, M.D.      Assessment/Plan Active Problems:   * No active hospital problems. *   1. Acute CHF exacerbation, unknown of systolic or diastolic: The patient carries no prior documented history of CHF. Thus for cardiac biomarkers are negative. [place] the patient. Telemetry floor and inpatient daily Lasix with close in and out monitoring. Will obtain a 2-D echocardiogram to reevaluate heart function. The last 2-D echocardiogram was on 03/30/2013 which demonstrated an EF of 50-55% with mild mitral valve regurgitation. 2. Hypertension: For now we'll continue the patient's home regimen. 3. Diabetes: Okay patient's home regimen consisting of glyburide. We'll also start supplemental sliding regimen. 4. CAD: Appears to be stable. Patient is asymptomatic 5. DVT prophylaxis   Code Status: Full (must indicate code status--if unknown or must be presumed,  indicate so) Family Communication: Patient in room (indicate person spoken with, if applicable, with phone number if by telephone) Disposition Plan: Pending (indicate anticipated LOS)  Time spent: 25 minutes  Raeleigh Guinn K [facility] Pager [phone]  If 7PM-7AM, please contact night-coverage [email or web address] Password [facility] 04/18/2013, 7:55 AM

## 2013-04-18 NOTE — ED Notes (Signed)
Helped assist patient from floor to bed.   Per EMT report, patient was being assisted from bed to bedside commode.  Patient became unstable and was assisted to sitting position on the floor.  No fall occurred.  No injury noted.

## 2013-04-18 NOTE — ED Notes (Signed)
Pt. Feeling better after respiratory treatment; no dependent edema. Pt. Placed on [place] @ 2 l.

## 2013-04-18 NOTE — ED Notes (Signed)
IV team paged.  

## 2013-04-18 NOTE — Progress Notes (Signed)
Pt admitted from ED Confused with short term memory loss. 02 at 3 l n/c 02 sat 98 % . Inc of bowel and bladder. Oriented to room several times within a few minutes. HOB up fine exp wheezes audible.

## 2013-04-18 NOTE — ED Notes (Addendum)
Pt from [facility] via [facility]. Per EMS initial O2 Sat 92%. Per EMS pt c/o SOB and HTN. Per EMS, pt has inspiratory and expiratory wheezes in all lung fields. EMS gave 10mg  Albuterol and 0.5 Atrovent via Nebulizer. Per EMS pt A&Ox4 and interactive. PT denies history of respiratory issues

## 2013-04-18 NOTE — ED Notes (Signed)
Pt cleaned up and new brief put on.

## 2013-04-18 NOTE — ED Notes (Signed)
Was assisting pt to the bedside commode from bed when pt became unsteady.  Assisted pt to sitting position on floor to prevent fall.  No injury, pt does not complain of any problems.  Yellow fall risk bracelet placed on pt and red socks placed on pt.  Pt placed back in bed and bed pan used.

## 2013-04-18 NOTE — ED Notes (Signed)
Asked pt to get undressed and pt stated "im not taking my pants off for nobody"

## 2013-04-19 LAB — COMPREHENSIVE METABOLIC PANEL
Albumin: 2 g/dL — ABNORMAL LOW (ref 3.5–5.2)
BUN: 9 mg/dL (ref 6–23)
Chloride: 100 mEq/L (ref 96–112)
Creatinine, Ser: 0.77 mg/dL (ref 0.50–1.10)
GFR calc Af Amer: 88 mL/min — ABNORMAL LOW (ref >90)
Total Bilirubin: 0.4 mg/dL (ref 0.3–1.2)

## 2013-04-19 LAB — GLUCOSE, CAPILLARY
Glucose-Capillary: 87 mg/dL (ref 70–99)
Glucose-Capillary: 135 mg/dL — ABNORMAL HIGH (ref 70–99)

## 2013-04-19 NOTE — Progress Notes (Signed)
Subjective: Patient pleasantly confused. She's in no apparent distress. There have been no problems per nursing. She was admitted because of concern for dyspnea, felt to have CHF. She had a fairly normal echo in April.  Objective: Vital signs in last 24 hours: Temp:  [98 F (36.7 C)-98.6 F (37 C)] 98.2 F (36.8 C) (05/17 0507) Pulse Rate:  [64-78] 70 (05/17 0507) Resp:  [18-20] 18 (05/17 0507) BP: (138-167)/(60-79) 167/77 mmHg (05/17 0507) SpO2:  [97 %-100 %] 98 % (05/17 0507) Weight:  [79.2 kg (174 lb 9.7 oz)-79.8 kg (175 lb 14.8 oz)] 79.8 kg (175 lb 14.8 oz) (05/17 0507) Weight change:  Last BM Date: 04/18/13  Intake/Output from previous day: 05/16 0701 - 05/17 0700 In: 343 [P.O.:340; I.V.:3] Out: 3 [Urine:1; Stool:2] Intake/Output this shift: Total I/O In: 120 [P.O.:120] Out: -   General appearance: Pleasantly confused Resp: clear to auscultation bilaterally Cardio: regular rate and rhythm, S1, S2 normal, no murmur, click, rub or gallop Extremities: 1+ edema bilateral  Lab Results:  Results for orders placed during the hospital encounter of 04/18/13 (from the past 24 hour(s))  TROPONIN I     Status: None   Collection Time    04/18/13 11:05 AM      Result Value Range   Troponin I <0.30  <0.30 ng/mL  GLUCOSE, CAPILLARY     Status: Abnormal   Collection Time    04/18/13 11:59 AM      Result Value Range   Glucose-Capillary 210 (*) 70 - 99 mg/dL   Comment 1 Notify RN    GLUCOSE, CAPILLARY     Status: None   Collection Time    04/18/13  4:17 PM      Result Value Range   Glucose-Capillary 90  70 - 99 mg/dL  TROPONIN I     Status: None   Collection Time    04/18/13  5:59 PM      Result Value Range   Troponin I <0.30  <0.30 ng/mL  GLUCOSE, CAPILLARY     Status: Abnormal   Collection Time    04/18/13  8:55 PM      Result Value Range   Glucose-Capillary 101 (*) 70 - 99 mg/dL  COMPREHENSIVE METABOLIC PANEL     Status: Abnormal   Collection Time    04/19/13  5:30  AM      Result Value Range   Sodium 138  135 - 145 mEq/L   Potassium 3.6  3.5 - 5.1 mEq/L   Chloride 100  96 - 112 mEq/L   CO2 27  19 - 32 mEq/L   Glucose, Bld 96  70 - 99 mg/dL   BUN 9  6 - 23 mg/dL   Creatinine, Ser 7.82  0.50 - 1.10 mg/dL   Calcium 8.0 (*) 8.4 - 10.5 mg/dL   Total Protein 6.2  6.0 - 8.3 g/dL   Albumin 2.0 (*) 3.5 - 5.2 g/dL   AST 16  0 - 37 U/L   ALT 8  0 - 35 U/L   Alkaline Phosphatase 85  39 - 117 U/L   Total Bilirubin 0.4  0.3 - 1.2 mg/dL   GFR calc non Af Amer 76 (*) >90 mL/min   GFR calc Af Amer 88 (*) >90 mL/min  GLUCOSE, CAPILLARY     Status: None   Collection Time    04/19/13  5:51 AM      Result Value Range   Glucose-Capillary 87  70 - 99 mg/dL  Studies/Results: Dg Chest Port 1 View  04/18/2013   *RADIOLOGY REPORT*  Clinical Data: Shortness of breath  PORTABLE CHEST - 1 VIEW  Comparison: 04/10 09:14  Findings: Cardiomegaly.  Central vascular congestion.  Interval increased perihilar and bibasilar opacities.  Layering effusions not excluded.  Left chest wall battery pack with lead tips unchanged in position.  No pneumothorax.  No interval osseous change.  IMPRESSION: Interval vascular congestion and perihilar/infrahilar opacities; pulmonary edema (favored) versus multifocal infection.   Original Report Authenticated By: Jearld Lesch, M.D.    Medications:  Prior to Admission:  Prescriptions prior to admission  Medication Sig Dispense Refill  . ALPRAZolam (XANAX) 0.25 MG tablet Take 0.125 mg by mouth every 8 (eight) hours as needed for anxiety.      . insulin aspart (NOVOLOG) 100 UNIT/ML injection Inject 0-10 Units into the skin 3 (three) times daily with meals. Blood sugar under 150, give 0 units; 150-200 give 2 units; 201-250 give 4 units; 251-300 give 6 units, 301-350 give 8 units, and 351--400 give 10 units.  Above 400, call MD.      . metoprolol succinate (TOPROL-XL) 25 MG 24 hr tablet Take 25 mg by mouth 2 (two) times daily.      .  Alcaftadine (LASTACAFT) 0.25 % SOLN Place 1 drop into both eyes daily.       Marland Kitchen aspirin EC 325 MG tablet Take 325 mg by mouth daily.      . carbamazepine (TEGRETOL) 200 MG tablet Take 400 mg by mouth 2 (two) times daily.      . cholecalciferol (VITAMIN D) 1000 UNITS tablet Take 1,000 Units by mouth 2 (two) times daily.      . clopidogrel (PLAVIX) 75 MG tablet Take 75 mg by mouth daily.      Marland Kitchen glyBURIDE (DIABETA) 2.5 MG tablet Take 2.5 mg by mouth every morning.       . isosorbide mononitrate (IMDUR) 30 MG 24 hr tablet Take 60 mg by mouth daily.      . mometasone (ELOCON) 0.1 % ointment Apply 1 application topically daily. Apply around and to skin of eyelids for 1 week courses when inflammation occurs, do not get this medication in the eyes      . nitroGLYCERIN (NITROSTAT) 0.4 MG SL tablet Place 0.4 mg under the tongue every 5 (five) minutes x 3 doses as needed. For chest pain      . omeprazole (PRILOSEC) 20 MG capsule Take 20 mg by mouth daily before breakfast. Take on empty stomach at 630 am      . simvastatin (ZOCOR) 20 MG tablet Take 20 mg by mouth at bedtime.      Marland Kitchen telmisartan (MICARDIS) 80 MG tablet Take 80 mg by mouth daily.       Scheduled: . aspirin EC  325 mg Oral Daily  . carbamazepine  400 mg Oral BID  . cholecalciferol  1,000 Units Oral BID  . clopidogrel  75 mg Oral Daily  . furosemide  40 mg Intravenous Daily  . glyBURIDE  2.5 mg Oral Q breakfast  . heparin  5,000 Units Subcutaneous Q8H  . insulin aspart  0-15 Units Subcutaneous TID WC  . insulin aspart  0-5 Units Subcutaneous QHS  . irbesartan  75 mg Oral Daily  . isosorbide mononitrate  60 mg Oral Daily  . metoprolol succinate  25 mg Oral BID  . pantoprazole  40 mg Oral Daily  . simvastatin  20 mg Oral q1800  . sodium  chloride  3 mL Intravenous Q12H   Continuous: . nitroGLYCERIN Stopped (04/18/13 0854)    Assessment/Plan: Patient admitted for dyspnea, her lung exam currently is normal. We will check followup x-ray  and she was given Lasix. As she recently had an echo we will not resume an echo, so far cardiac enzymes are negative. As for her leg edema support stockings will be added. This could be some dependent edema. Cognitive dysfunction, unable to obtain a meaningful history. Appears recently patient was admitted with a UTI, we will repeat UA History of coronary artery disease clinically stable. Patient has a history of permanent pacemaker placement, EKG shows left bundle which was present before. Diabetes continue home meds Hypertension History of seizure disorder  LOS: 1 day   Labrenda Lasky D 04/19/2013, 9:03 AM

## 2013-04-19 NOTE — Clinical Social Work Psychosocial (Signed)
Clinical Social Work Department BRIEF PSYCHOSOCIAL ASSESSMENT 04/19/2013  Patient:  Sienkiewicz,Bindu     Account Number:  [id number]     Admit date:  04/18/2013  Clinical Social Worker:  Oswaldo Done  Date/Time:  04/19/2013 03:42 PM  Referred by:  CSW  Date Referred:  04/18/2013 Referred for  SNF Placement   Other Referral:   Interview type:   Other interview type:    PSYCHOSOCIAL DATA Living Status:  FACILITY Admitted from facility:  [facility] [place] Level of care:   Primary support name:  Dahlia Client Primary support relationship to patient:  CHILD, ADULT Degree of support available:   Limited    CURRENT CONCERNS Current Concerns  Post-Acute Placement   Other Concerns:    SOCIAL WORK ASSESSMENT / PLAN CSW talked with patient's daughter, Dahlia Client, [phone], who lives in [place]. Daughter would like patient to return to [facility] upon discharge if possible. Prior to recent admission to [facility], patient was living at [facility]. Daughter states that she did not hold bed at [facility]. Daughter okay with sending patient records to [facility], [facility] and [facility] in case [facility] does not have a bed available upon discharge.   Assessment/plan status:  Information/Referral to [facility] Other assessment/ plan:   Information/referral to community resources:   Informed daughter of www.medicare.gov as a way to check facility ratings online.    PATIENT'S/FAMILY'S RESPONSE TO PLAN OF CARE: Patient's daughter thanked CSW for inquiring about patinet plans upon discharge, providing information on how to research facility ratings aad for assisting with discharge plans to skilled nursing facility. Weekday CSW will continue to follow up and assist with placement.       Ricke Hey, [place] [phone] (weekend)

## 2013-04-20 ENCOUNTER — Inpatient Hospital Stay (HOSPITAL_COMMUNITY): Payer: Medicare Other

## 2013-04-20 LAB — BASIC METABOLIC PANEL
BUN: 9 mg/dL (ref 6–23)
Calcium: 7.8 mg/dL — ABNORMAL LOW (ref 8.4–10.5)
Creatinine, Ser: 0.82 mg/dL (ref 0.50–1.10)
GFR calc Af Amer: 75 mL/min — ABNORMAL LOW (ref >90)
GFR calc non Af Amer: 65 mL/min — ABNORMAL LOW (ref >90)
Potassium: 3.9 mEq/L (ref 3.5–5.1)

## 2013-04-20 LAB — PRO B NATRIURETIC PEPTIDE: Pro B Natriuretic peptide (BNP): 3588 pg/mL — ABNORMAL HIGH (ref 0–450)

## 2013-04-20 LAB — GLUCOSE, CAPILLARY
Glucose-Capillary: 115 mg/dL — ABNORMAL HIGH (ref 70–99)
Glucose-Capillary: 103 mg/dL — ABNORMAL HIGH (ref 70–99)

## 2013-04-20 MED ORDER — FUROSEMIDE 40 MG PO TABS
40.0000 mg | ORAL_TABLET | Freq: Every day | ORAL | Status: DC
  Administered 2013-04-20 – 2013-04-21 (×2): 40 mg via ORAL
  Filled 2013-04-20 (×2): qty 1

## 2013-04-20 NOTE — Progress Notes (Signed)
Subjective: Patient is much more pleasant and oriented today. There was no fever no chills. Unfortunately nursing unable to get ua  as patient is incontinent. Patient without fever chills leukocytosis or abdominal pain. She denies chest pain shortness of breath. Related images the  Objective: Vital signs in last 24 hours: Temp:  [98.2 F (36.8 C)-98.6 F (37 C)] 98.3 F (36.8 C) (05/18 0538) Pulse Rate:  [56-66] 56 (05/18 0538) Resp:  [18] 18 (05/18 0538) BP: (130-188)/(58-77) 188/77 mmHg (05/18 0538) SpO2:  [94 %-96 %] 95 % (05/18 0538) Weight:  [78.2 kg (172 lb 6.4 oz)] 78.2 kg (172 lb 6.4 oz) (05/18 0538) Weight change: -1.6 kg (-3 lb 8.4 oz) Last BM Date: 04/19/13  Intake/Output from previous day: 05/17 0701 - 05/18 0700 In: 418 [P.O.:418] Out: 7 [Urine:6; Stool:1] Intake/Output this shift:    General appearance: cooperative Resp: clear to auscultation bilaterally Cardio: regular rate and rhythm Extremities: Trace edema  Lab Results:  Results for orders placed during the hospital encounter of 04/18/13 (from the past 24 hour(s))  GLUCOSE, CAPILLARY     Status: Abnormal   Collection Time    04/19/13 11:16 AM      Result Value Range   Glucose-Capillary 135 (*) 70 - 99 mg/dL  GLUCOSE, CAPILLARY     Status: None   Collection Time    04/19/13  4:04 PM      Result Value Range   Glucose-Capillary 70  70 - 99 mg/dL  GLUCOSE, CAPILLARY     Status: None   Collection Time    04/19/13  9:03 PM      Result Value Range   Glucose-Capillary 82  70 - 99 mg/dL  BASIC METABOLIC PANEL     Status: Abnormal   Collection Time    04/20/13  4:30 AM      Result Value Range   Sodium 137  135 - 145 mEq/L   Potassium 3.9  3.5 - 5.1 mEq/L   Chloride 98  96 - 112 mEq/L   CO2 30  19 - 32 mEq/L   Glucose, Bld 85  70 - 99 mg/dL   BUN 9  6 - 23 mg/dL   Creatinine, Ser 0.98  0.50 - 1.10 mg/dL   Calcium 7.8 (*) 8.4 - 10.5 mg/dL   GFR calc non Af Amer 65 (*) >90 mL/min   GFR calc Af Amer 75  (*) >90 mL/min  GLUCOSE, CAPILLARY     Status: None   Collection Time    04/20/13  5:51 AM      Result Value Range   Glucose-Capillary 80  70 - 99 mg/dL      Studies/Results: No results found.  Medications:  Prior to Admission:  Prescriptions prior to admission  Medication Sig Dispense Refill  . ALPRAZolam (XANAX) 0.25 MG tablet Take 0.125 mg by mouth every 8 (eight) hours as needed for anxiety.      . insulin aspart (NOVOLOG) 100 UNIT/ML injection Inject 0-10 Units into the skin 3 (three) times daily with meals. Blood sugar under 150, give 0 units; 150-200 give 2 units; 201-250 give 4 units; 251-300 give 6 units, 301-350 give 8 units, and 351--400 give 10 units.  Above 400, call MD.      . metoprolol succinate (TOPROL-XL) 25 MG 24 hr tablet Take 25 mg by mouth 2 (two) times daily.      . Alcaftadine (LASTACAFT) 0.25 % SOLN Place 1 drop into both eyes daily.       Marland Kitchen  aspirin EC 325 MG tablet Take 325 mg by mouth daily.      . carbamazepine (TEGRETOL) 200 MG tablet Take 400 mg by mouth 2 (two) times daily.      . cholecalciferol (VITAMIN D) 1000 UNITS tablet Take 1,000 Units by mouth 2 (two) times daily.      . clopidogrel (PLAVIX) 75 MG tablet Take 75 mg by mouth daily.      Marland Kitchen glyBURIDE (DIABETA) 2.5 MG tablet Take 2.5 mg by mouth every morning.       . isosorbide mononitrate (IMDUR) 30 MG 24 hr tablet Take 60 mg by mouth daily.      . mometasone (ELOCON) 0.1 % ointment Apply 1 application topically daily. Apply around and to skin of eyelids for 1 week courses when inflammation occurs, do not get this medication in the eyes      . nitroGLYCERIN (NITROSTAT) 0.4 MG SL tablet Place 0.4 mg under the tongue every 5 (five) minutes x 3 doses as needed. For chest pain      . omeprazole (PRILOSEC) 20 MG capsule Take 20 mg by mouth daily before breakfast. Take on empty stomach at 630 am      . simvastatin (ZOCOR) 20 MG tablet Take 20 mg by mouth at bedtime.      Marland Kitchen telmisartan (MICARDIS) 80 MG  tablet Take 80 mg by mouth daily.       Scheduled: . aspirin EC  325 mg Oral Daily  . carbamazepine  400 mg Oral BID  . cholecalciferol  1,000 Units Oral BID  . clopidogrel  75 mg Oral Daily  . furosemide  40 mg Intravenous Daily  . glyBURIDE  2.5 mg Oral Q breakfast  . heparin  5,000 Units Subcutaneous Q8H  . insulin aspart  0-15 Units Subcutaneous TID WC  . insulin aspart  0-5 Units Subcutaneous QHS  . irbesartan  75 mg Oral Daily  . isosorbide mononitrate  60 mg Oral Daily  . metoprolol succinate  25 mg Oral BID  . pantoprazole  40 mg Oral Daily  . simvastatin  20 mg Oral q1800  . sodium chloride  3 mL Intravenous Q12H   Continuous: . nitroGLYCERIN Stopped (04/18/13 0854)    Assessment/Plan: Patient admitted with dyspnea, there was concern for CHF. She is on IV Lasix, will switch to by mouth. Continue support stockings, followup clinically. Repeat echo has been counseled as patient recently had an echo in April with fairly preserved LV function. Check followup BNP today and be met in the a.m. Cognitive dysfunction, unable to obtain a meaningful history. History of coronary artery disease clinically stable. Patient has a history of permanent pacemaker placement, EKG shows left bundle which was present before.  Diabetes continue home meds  Hypertension  History of seizure disorder   LOS: 2 days   [name] [name] D 04/20/2013, 9:04 AM

## 2013-04-21 LAB — BASIC METABOLIC PANEL
BUN: 8 mg/dL (ref 6–23)
CO2: 32 mEq/L (ref 19–32)
Chloride: 97 mEq/L (ref 96–112)
Glucose, Bld: 107 mg/dL — ABNORMAL HIGH (ref 70–99)
Potassium: 3.2 mEq/L — ABNORMAL LOW (ref 3.5–5.1)

## 2013-04-21 LAB — GLUCOSE, CAPILLARY
Glucose-Capillary: 110 mg/dL — ABNORMAL HIGH (ref 70–99)
Glucose-Capillary: 106 mg/dL — ABNORMAL HIGH (ref 70–99)

## 2013-04-21 MED ORDER — FUROSEMIDE 40 MG PO TABS: 40.0000 mg | ORAL_TABLET | Freq: Every day | ORAL | Status: DC

## 2013-04-21 MED ORDER — ASPIRIN 81 MG PO TBEC: 81.0000 mg | DELAYED_RELEASE_TABLET | Freq: Every day | ORAL | Status: AC

## 2013-04-21 MED ORDER — ENSURE COMPLETE PO LIQD: 237.0000 mL | Freq: Two times a day (BID) | ORAL | Status: DC

## 2013-04-21 NOTE — Progress Notes (Signed)
INITIAL NUTRITION ASSESSMENT  DOCUMENTATION CODES Per approved criteria  -Not Applicable   INTERVENTION:  Ensure Complete twice daily (350 kcals, 13 gm protein per 8 fl oz bottle) RD to follow for nutrition care plan  NUTRITION DIAGNOSIS: Inadequate oral intake related to dementia, advanced age as evidenced by PO intake 0-35%  Goal: Oral intake with meals & supplements to meet >/= 90% of estimated nutrition needs  Monitor:  PO & supplemental intake, weight, labs, I/O's  Reason for Assessment: Low Braden  79 y.o. female  Admitting Dx: acute CHF exacerbation  ASSESSMENT: Patient admitted from her nursing facility with acute onset shortness of breath; RD unable to obtain nutrition hx as patient demented; PO intake poor at 0-35%; patient at risk for skin breakdown given low braden score; would benefit from addition of nutrition supplements to help meet kcals, protein & promote skin integrity ---> RD to order.  Height: Ht Readings from Last 1 Encounters:  04/18/13 5\' 4"  (1.626 m)    Weight: Wt Readings from Last 1 Encounters:  04/21/13 164 lb 14.5 oz (74.8 kg)    Ideal Body Weight: 120 lb  % Ideal Body Weight: 136%  Wt Readings from Last 10 Encounters:  04/21/13 164 lb 14.5 oz (74.8 kg)  04/03/13 230 lb 3.2 oz (104.418 kg)  01/20/13 166 lb 14.2 oz (75.7 kg)  06/18/12 172 lb 6.4 oz (78.2 kg)  06/18/12 172 lb 6.4 oz (78.2 kg)  06/18/12 172 lb 6.4 oz (78.2 kg)  06/18/12 172 lb 6.4 oz (78.2 kg)    Usual Body Weight: unable to obtain  % Usual Body Weight: ---  BMI:  Body mass index is 28.29 kg/(m^2).  Estimated Nutritional Needs: Kcal: 1500-1700 Protein: 70-80 gm Fluid: 1.7-1.8 L  Skin: Intact  Diet Order: Cardiac  EDUCATION NEEDS: -No education needs identified at this time   Intake/Output Summary (Last 24 hours) at 04/21/13 1520 Last data filed at 04/21/13 0900  Gross per 24 hour  Intake    220 ml  Output      1 ml  Net    219 ml    Last BM:  5/17  Labs:   Recent Labs Lab 04/19/13 0530 04/20/13 0430 04/21/13 0446  NA 138 137 137  K 3.6 3.9 3.2*  CL 100 98 97  CO2 27 30 32  BUN 9 9 8   CREATININE 0.77 0.82 0.85  CALCIUM 8.0* 7.8* 8.1*  GLUCOSE 96 85 107*    CBG (last 3)   Recent Labs  04/20/13 2201 04/21/13 0535 04/21/13 1128  GLUCAP 103* 110* 106*    Scheduled Meds: . aspirin EC  325 mg Oral Daily  . carbamazepine  400 mg Oral BID  . cholecalciferol  1,000 Units Oral BID  . clopidogrel  75 mg Oral Daily  . furosemide  40 mg Oral Daily  . glyBURIDE  2.5 mg Oral Q breakfast  . heparin  5,000 Units Subcutaneous Q8H  . insulin aspart  0-15 Units Subcutaneous TID WC  . insulin aspart  0-5 Units Subcutaneous QHS  . irbesartan  75 mg Oral Daily  . isosorbide mononitrate  60 mg Oral Daily  . metoprolol succinate  25 mg Oral BID  . pantoprazole  40 mg Oral Daily  . simvastatin  20 mg Oral q1800  . sodium chloride  3 mL Intravenous Q12H    Continuous Infusions: . nitroGLYCERIN Stopped (04/18/13 1610)    Past Medical History  Diagnosis Date  . Hypertension   . Acid  reflux   . Seizures   . Coronary artery disease 2003    60-75%D2, 50% D1, 25% left circ, 20% LAD, normal LVF  . Dyslipidemia   . LBBB (left bundle branch block)   . Confusion   . Pacemaker   . Fall at nursing home 03/29/2013  . Type II diabetes mellitus   . Myocardial infarction     "long time ago" (03/31/2013)  . Exertional shortness of breath     "once in awhile" (03/31/2013)  . Stroke     dennies residual 03/31/2013  . Anxiety   . Multi-infarct dementia   . Acute pulmonary edema     Past Surgical History  Procedure Laterality Date  . Cholecystectomy    . Vaginal hysterectomy    . Appendectomy    . Iridectomy    . Cardiac catheterization    . Eye surgery      cataracts/ iridectomy  . Tonsillectomy and adenoidectomy    . Mastoidectomy Right   . Cataract extraction w/ intraocular lens  implant, bilateral    . Dilation and  curettage of uterus    . Tubal ligation    . Insert / replace / remove pacemaker  06/20/2012   Maureen Chatters, RD, LDN Pager #: [phone] After-Hours Pager #: [phone]

## 2013-04-21 NOTE — Discharge Summary (Signed)
Physician Discharge Summary  NAME:[name] [name]  UJW:[id number]  DOB: 1928/10/19   Admit date: 04/18/2013 Discharge date: 04/21/2013  Discharge Diagnoses:   Active Problems:  1. Acute CHF exacerbation - The last 2-D echocardiogram was on 03/30/2013 which demonstrated an EF of 50-55% with mild mitral valve regurgitation. Has responded well to Lasix and we will continue nitrates. Possibly ischemic. No further workup at this time but will treat symptomatically. Patient is no CODE BLUE 2. Hypertension: For now we'll continue the patient's nursing home regimen. 3. Diabetes: controlled 4. CAD: May have caused CHF 5. DVT prophylaxis 6. Recent GI bleed - continue on 81 mg aspirin only instead of full-strength aspirin and Plavix for strep her vascular disease   Code Status:   Patient will be made a no CODE BLUE at the nursing home. Currently full code for transport  Discharge Physical Exam:  General Appearance: Alert, cooperative, no distress, appears stated age  Weight change: -3.4 kg (-7 lb 7.9 oz)  Intake/Output Summary (Last 24 hours) at 04/21/13 0856 Last data filed at 04/20/13 2200  Gross per 24 hour  Intake    280 ml  Output      1 ml  Net    279 ml   Filed Vitals:   04/20/13 1008 04/20/13 1418 04/20/13 2108 04/21/13 0558  BP: 156/73 152/73 141/94 161/91  Pulse: 70 66 67 64  Temp:  98.5 F (36.9 C) 98.3 F (36.8 C) 99.3 F (37.4 C)  TempSrc:  Oral Oral Oral  Resp:  20 20 20   Height:      Weight:    74.8 kg (164 lb 14.5 oz)  SpO2:  96% 97% 96%    Lungs: Clear to auscultation bilaterally, respirations unlabored Heart: Regular rate and rhythm, S1 and S2 normal, no murmur, rub or gallop Abdomen: Soft, non-tender, bowel sounds active all four quadrants, no masses, no organomegaly Extremities: Extremities normal, atraumatic, no cyanosis or edema Neuro:  Oriented x1 only, her name. She has chronic dementia. Otherwise nonfocal  Discharge Condition: improved  Hospital Course:   79 year old female with chronic seizure disorder and dementia. She also has coronary artery disease, diabetes, type II, hypertension, and hyperlipidemia. Patient recently had a GI bleed at the nursing home and aspirin and Plavix were held. She did not develop significant anemia. However, she did develop acute onset of shortness of breath on the day of admission and was transferred to [facility] where she was found to be in pulmonary edema. She responded well to IV Lasix x1. Recent 2-D echo revealed an EF of 50-55% and diastolic dysfunction. Patient has done well with diuresis and we will continue nitrate therapy and Lasix as an outpatient. Because of recent GI bleed. We'll only start aspirin 81 mg daily for history of coronary artery disease and cerebrovascular disease. Continue therapy for seizure disorder. Will make patient a no CODE BLUE at the nursing facility but full code for transport and further clarification with discussion with her daughter who has agreed to a no CODE BLUE in the past  Things to follow up in the Nursing home setting:  Follow for recurrent decompensated congestive heart failure, seizure disorder, 2 diabetes mellitus and blood sugar control, and manage dementia  Consults:  Non-contributory  Disposition: 03-Skilled Nursing Facility  Discharge Orders   Future Orders Complete By Expires     (HEART FAILURE PATIENTS) Call MD:  Anytime you have any of the following symptoms: 1) 3 pound weight gain in 24 hours or  5 pounds in 1 week 2) shortness of breath, with or without a dry hacking cough 3) swelling in the hands, feet or stomach 4) if you have to sleep on extra pillows at night in order to breathe.  As directed     Call MD for:  difficulty breathing, headache or visual disturbances  As directed     Call MD for:  persistant nausea and vomiting  As directed     Call MD for:  severe uncontrolled pain  As directed     Call MD for:  temperature >100.4  As directed      Diet - low sodium heart healthy  As directed     Increase activity slowly  As directed         Medication List    STOP taking these medications       clopidogrel 75 MG tablet  Commonly known as:  PLAVIX      TAKE these medications       ALPRAZolam 0.25 MG tablet  Commonly known as:  XANAX  Take 0.125 mg by mouth every 8 (eight) hours as needed for anxiety.     aspirin 81 MG EC tablet  Take 1 tablet (81 mg total) by mouth daily.     carbamazepine 200 MG tablet  Commonly known as:  TEGRETOL  Take 400 mg by mouth 2 (two) times daily.     cholecalciferol 1000 UNITS tablet  Commonly known as:  VITAMIN D  Take 1,000 Units by mouth 2 (two) times daily.     furosemide 40 MG tablet  Commonly known as:  LASIX  Take 1 tablet (40 mg total) by mouth daily.     glyBURIDE 2.5 MG tablet  Commonly known as:  DIABETA  Take 2.5 mg by mouth every morning.     insulin aspart 100 UNIT/ML injection  Commonly known as:  novoLOG  Inject 0-10 Units into the skin 3 (three) times daily with meals. Blood sugar under 150, give 0 units; 150-200 give 2 units; 201-250 give 4 units; 251-300 give 6 units, 301-350 give 8 units, and 351--400 give 10 units.  Above 400, call MD.     isosorbide mononitrate 30 MG 24 hr tablet  Commonly known as:  IMDUR  Take 60 mg by mouth daily.     LASTACAFT 0.25 % Soln  Generic drug:  Alcaftadine  Place 1 drop into both eyes daily.     metoprolol succinate 25 MG 24 hr tablet  Commonly known as:  TOPROL-XL  Take 25 mg by mouth 2 (two) times daily.     mometasone 0.1 % ointment  Commonly known as:  ELOCON  Apply 1 application topically daily. Apply around and to skin of eyelids for 1 week courses when inflammation occurs, do not get this medication in the eyes     nitroGLYCERIN 0.4 MG SL tablet  Commonly known as:  NITROSTAT  Place 0.4 mg under the tongue every 5 (five) minutes x 3 doses as needed. For chest pain     omeprazole 20 MG capsule  Commonly known  as:  PRILOSEC  Take 20 mg by mouth daily before breakfast. Take on empty stomach at 630 am     simvastatin 20 MG tablet  Commonly known as:  ZOCOR  Take 20 mg by mouth at bedtime.     telmisartan 80 MG tablet  Commonly known as:  MICARDIS  Take 80 mg by mouth daily.  The results of significant diagnostics from this hospitalization (including imaging, microbiology, ancillary and laboratory) are listed below for reference.    Significant Diagnostic Studies: Dg Chest 2 View  04/01/2013   *RADIOLOGY REPORT*  Clinical Data: Tachypnea and fever.  CHEST - 2 VIEW  Comparison: 06/21/2012.  03/30/2013  Findings: Cardiac silhouette is borderline upper limits of normal size. Ectasia and nonaneurysmal calcification of the thoracic aorta are seen.  Two lead transvenous pacemaker is in place with generator on the left.  No consolidation or pleural effusion is evident.  There is slight prominence of reticular interstitial markings on the lateral image.  There is central peribronchial thickening.  There is osteopenic appearance of bones.  IMPRESSION: Borderline cardiac size.  Transvenous pacemaker in place.  Slight prominence of reticular interstitial markings on lateral image with central peribronchial thickening.  No consolidation or pleural effusion is evident.   Original Report Authenticated By: Onalee Hua Call   Dg Hip Complete Right  03/30/2013   *RADIOLOGY REPORT*  Clinical Data: Fall with right hip pain.  RIGHT HIP - COMPLETE 2+ VIEW  Comparison: None  Findings: There is no evidence of fracture, subluxation or dislocation. The joint spaces are unremarkable. No focal bony lesions are identified. No significant abnormalities are noted.  IMPRESSION: No evidence of acute abnormality.   Original Report Authenticated By: Harmon Pier, M.D.   Ct Head Wo Contrast  04/02/2013   *RADIOLOGY REPORT*  Clinical Data: Speech changes.  Fall.  Urinary tract infection. Fever 2 days ago.  CT HEAD WITHOUT CONTRAST   Technique:  Contiguous axial images were obtained from the base of the skull through the vertex without contrast.  Comparison: 03/30/2013  Findings: The whole areas of encephalomalacia in the right posterior parietal and left posterior temporal and occipital regions consistent with old infarcts.  Diffuse cerebral atrophy. No mass effect or midline shift.  No abnormal extra-axial fluid collections.  Gray-white matter junctions are distinct.  Basal cisterns are not effaced.  Ventricular dilatation consistent with combination of central atrophy and negative mass effect due to infarcts.  No effacement basal cisterns.  No acute intracranial hemorrhage.  Old cerebellar infarcts bilaterally.  Stable appearance of intracranial contents since previous study. Retention cyst in the right maxillary antrum.  Postoperative change in the right mastoid air cells.  No depressed skull fractures.  IMPRESSION: No acute intracranial abnormalities.  Stable appearance of multi focal infarcts since previous study.  Chronic atrophy and small vessel ischemic changes.   Original Report Authenticated By: Burman Nieves, M.D.   Ct Head Wo Contrast  03/30/2013   *RADIOLOGY REPORT*  Clinical Data: Loss of consciousness after fall.  CT HEAD WITHOUT CONTRAST  Technique:  Contiguous axial images were obtained from the base of the skull through the vertex without contrast.  Comparison: 02/28/2013  Findings: Diffuse cerebral atrophy.  Ventricular dilatation consistent with central atrophy.  Focal areas of encephalomalacia in the right parietal lobe and left occipital lobe consistent with old infarcts.  There is associated ventricular dilatation due to negative mass effect.  These changes are stable since the previous study.  Old lacunar infarcts in the cerebellar hemispheres.  No mass effect or midline shift.  No abnormal extra-axial fluid collections.  Gray-white matter junctions are distinct.  Basal cisterns are not effaced.  No evidence of  acute intracranial hemorrhage.  No depressed skull fractures.  Mucosal membrane thickening in the maxillary antra.  No acute air-fluid levels are demonstrated.  Vascular calcifications.  Postoperative changes in the right mastoid  air cells with air-fluid levels demonstrated in the right mastoid region.  IMPRESSION: No acute intracranial abnormalities.  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  Postoperative changes in the right mastoid region with air-fluid level present.   Original Report Authenticated By: Burman Nieves, M.D.   Ct Abdomen Pelvis W Contrast  03/30/2013   *RADIOLOGY REPORT*  Clinical Data: Loss of consciousness.  Elevated liver function studies.  Fall.  CT ABDOMEN AND PELVIS WITH CONTRAST  Technique:  Multidetector CT imaging of the abdomen and pelvis was performed following the standard protocol during bolus administration of intravenous contrast.  Contrast: OMNIPAQUE IOHEXOL 300 MG/ML  SOLN  Comparison: 08/09/2008  Findings: Technically limited study due to motion artifact. Atelectasis or fibrosis in the lung bases.  Surgical absence of the gallbladder.  Pneumobilia is likely postoperative.  This was present previously.  No focal liver lesions are appreciated.  Diffuse fatty infiltration of the pancreas.  Spleen, adrenal glands, kidneys, inferior vena cava, and retroperitoneal lymph nodes are unremarkable.  Calcification of the aorta without aneurysm.  The stomach and small bowel are decompressed.  Stool filled colon without distension.  No free air or free fluid in the abdomen.  Pelvis:  Bladder wall is not thickened.  Uterus is atrophic or surgically absent.  Diverticula in the sigmoid colon without diverticulitis.  The appendix is not identified.  No abnormal adnexal masses.  No free or loculated pelvic fluid collections.  No significant pelvic lymphadenopathy.  Degenerative changes throughout the lumbar spine.  Diffuse demineralization.  No  vertebral compression fractures.  Normal alignment of the lumbar vertebrae.  Pelvis, sacrum, and hips appear intact.  IMPRESSION: No acute abnormalities demonstrated in the abdomen or pelvis. Surgical absence of the gallbladder with pneumobilia, likely postoperative.  No significant changes since previous study.   Original Report Authenticated By: Burman Nieves, M.D.   Dg Chest Port 1 View  04/20/2013   *RADIOLOGY REPORT*  Clinical Data: Follow-up CHF  PORTABLE CHEST - 1 VIEW  Comparison: Chest radiograph 04/18/2013 and 01/20/2013  Findings: Left chest wall dual lead pacer is stable in position on the frontal view.  Cardiomegaly is stable.  Pulmonary vascular congestion appears similar to slightly decreased.  Aeration of the lungs is improving compared to a chest radiograph of 04/18/2013, especially on the left.  There is some residual right perihilar airspace disease.  Suspect very small bilateral pleural effusions.  IMPRESSION: Improving airspace disease suggests decreasing pulmonary edema.   Original Report Authenticated By: Britta Mccreedy, M.D.   Dg Chest Port 1 View  04/18/2013   *RADIOLOGY REPORT*  Clinical Data: Shortness of breath  PORTABLE CHEST - 1 VIEW  Comparison: 04/10 09:14  Findings: Cardiomegaly.  Central vascular congestion.  Interval increased perihilar and bibasilar opacities.  Layering effusions not excluded.  Left chest wall battery pack with lead tips unchanged in position.  No pneumothorax.  No interval osseous change.  IMPRESSION: Interval vascular congestion and perihilar/infrahilar opacities; pulmonary edema (favored) versus multifocal infection.   Original Report Authenticated By: Jearld Lesch, M.D.   Dg Chest Port 1 View  03/30/2013   *RADIOLOGY REPORT*  Clinical Data: Altered mental status  PORTABLE CHEST - 1 VIEW  Comparison: 01/20/2013; 06/21/2012  Findings: Grossly unchanged cardiac silhouette and mediastinal contours given slightly reduced lung volumes.  Stable  positioning of support apparatus.  No focal parenchymal opacities.  No pleural effusion or pneumothorax.  No definite evidence of edema. Unchanged bones.  IMPRESSION: No acute cardiopulmonary  disease.   Original Report Authenticated By: Tacey Ruiz, MD    Microbiology: No results found for this or any previous visit (from the past 240 hour(s)).   Labs: Results for orders placed during the hospital encounter of 04/18/13  PRO B NATRIURETIC PEPTIDE      Result Value Range   Pro B Natriuretic peptide (BNP) 6172.0 (*) 0 - 450 pg/mL  CBC WITH DIFFERENTIAL      Result Value Range   WBC 9.0  4.0 - 10.5 K/uL   RBC 3.18 (*) 3.87 - 5.11 MIL/uL   Hemoglobin 10.1 (*) 12.0 - 15.0 g/dL   HCT 78.4 (*) 69.6 - 29.5 %   MCV 96.5  78.0 - 100.0 fL   MCH 31.8  26.0 - 34.0 pg   MCHC 32.9  30.0 - 36.0 g/dL   RDW 28.4  13.2 - 44.0 %   Platelets 428 (*) 150 - 400 K/uL   Neutrophils Relative % 64  43 - 77 %   Neutro Abs 5.8  1.7 - 7.7 K/uL   Lymphocytes Relative 25  12 - 46 %   Lymphs Abs 2.2  0.7 - 4.0 K/uL   Monocytes Relative 10  3 - 12 %   Monocytes Absolute 0.9  0.1 - 1.0 K/uL   Eosinophils Relative 1  0 - 5 %   Eosinophils Absolute 0.1  0.0 - 0.7 K/uL   Basophils Relative 0  0 - 1 %   Basophils Absolute 0.0  0.0 - 0.1 K/uL  TROPONIN I      Result Value Range   Troponin I <0.30  <0.30 ng/mL  HEMOGLOBIN A1C      Result Value Range   Hemoglobin A1C 5.7 (*) <5.7 %   Mean Plasma Glucose 117 (*) <117 mg/dL  TROPONIN I      Result Value Range   Troponin I <0.30  <0.30 ng/mL  TROPONIN I      Result Value Range   Troponin I <0.30  <0.30 ng/mL  GLUCOSE, CAPILLARY      Result Value Range   Glucose-Capillary 210 (*) 70 - 99 mg/dL   Comment 1 Notify RN    GLUCOSE, CAPILLARY      Result Value Range   Glucose-Capillary 90  70 - 99 mg/dL  GLUCOSE, CAPILLARY      Result Value Range   Glucose-Capillary 101 (*) 70 - 99 mg/dL  COMPREHENSIVE METABOLIC PANEL      Result Value Range   Sodium 138  135 -  145 mEq/L   Potassium 3.6  3.5 - 5.1 mEq/L   Chloride 100  96 - 112 mEq/L   CO2 27  19 - 32 mEq/L   Glucose, Bld 96  70 - 99 mg/dL   BUN 9  6 - 23 mg/dL   Creatinine, Ser 1.02  0.50 - 1.10 mg/dL   Calcium 8.0 (*) 8.4 - 10.5 mg/dL   Total Protein 6.2  6.0 - 8.3 g/dL   Albumin 2.0 (*) 3.5 - 5.2 g/dL   AST 16  0 - 37 U/L   ALT 8  0 - 35 U/L   Alkaline Phosphatase 85  39 - 117 U/L   Total Bilirubin 0.4  0.3 - 1.2 mg/dL   GFR calc non Af Amer 76 (*) >90 mL/min   GFR calc Af Amer 88 (*) >90 mL/min  GLUCOSE, CAPILLARY      Result Value Range   Glucose-Capillary 87  70 - 99 mg/dL  GLUCOSE, CAPILLARY      Result Value Range   Glucose-Capillary 135 (*) 70 - 99 mg/dL  GLUCOSE, CAPILLARY      Result Value Range   Glucose-Capillary 70  70 - 99 mg/dL  BASIC METABOLIC PANEL      Result Value Range   Sodium 137  135 - 145 mEq/L   Potassium 3.9  3.5 - 5.1 mEq/L   Chloride 98  96 - 112 mEq/L   CO2 30  19 - 32 mEq/L   Glucose, Bld 85  70 - 99 mg/dL   BUN 9  6 - 23 mg/dL   Creatinine, Ser 1.61  0.50 - 1.10 mg/dL   Calcium 7.8 (*) 8.4 - 10.5 mg/dL   GFR calc non Af Amer 65 (*) >90 mL/min   GFR calc Af Amer 75 (*) >90 mL/min  GLUCOSE, CAPILLARY      Result Value Range   Glucose-Capillary 82  70 - 99 mg/dL  GLUCOSE, CAPILLARY      Result Value Range   Glucose-Capillary 80  70 - 99 mg/dL  PRO B NATRIURETIC PEPTIDE      Result Value Range   Pro B Natriuretic peptide (BNP) 3588.0 (*) 0 - 450 pg/mL  GLUCOSE, CAPILLARY      Result Value Range   Glucose-Capillary 153 (*) 70 - 99 mg/dL   Comment 1 Notify RN    BASIC METABOLIC PANEL      Result Value Range   Sodium 137  135 - 145 mEq/L   Potassium 3.2 (*) 3.5 - 5.1 mEq/L   Chloride 97  96 - 112 mEq/L   CO2 32  19 - 32 mEq/L   Glucose, Bld 107 (*) 70 - 99 mg/dL   BUN 8  6 - 23 mg/dL   Creatinine, Ser 0.96  0.50 - 1.10 mg/dL   Calcium 8.1 (*) 8.4 - 10.5 mg/dL   GFR calc non Af Amer 62 (*) >90 mL/min   GFR calc Af Amer 72 (*) >90 mL/min   GLUCOSE, CAPILLARY      Result Value Range   Glucose-Capillary 59 (*) 70 - 99 mg/dL  GLUCOSE, CAPILLARY      Result Value Range   Glucose-Capillary 115 (*) 70 - 99 mg/dL  GLUCOSE, CAPILLARY      Result Value Range   Glucose-Capillary 103 (*) 70 - 99 mg/dL  GLUCOSE, CAPILLARY      Result Value Range   Glucose-Capillary 110 (*) 70 - 99 mg/dL  POCT I-STAT, CHEM 8      Result Value Range   Sodium 139  135 - 145 mEq/L   Potassium 3.3 (*) 3.5 - 5.1 mEq/L   Chloride 101  96 - 112 mEq/L   BUN 7  6 - 23 mg/dL   Creatinine, Ser 0.45  0.50 - 1.10 mg/dL   Glucose, Bld 409 (*) 70 - 99 mg/dL   Calcium, Ion 8.11 (*) 1.13 - 1.30 mmol/L   TCO2 26  0 - 100 mmol/L   Hemoglobin 10.5 (*) 12.0 - 15.0 g/dL   HCT 91.4 (*) 78.2 - 95.6 %  SAMPLE TO BLOOD BANK      Result Value Range   Blood Bank Specimen SAMPLE AVAILABLE FOR TESTING     Sample Expiration 04/19/2013      Time coordinating discharge: 41 minutes  Signed: Pearla Dubonnet, MD 04/21/2013, 8:56 AM

## 2013-04-21 NOTE — Progress Notes (Signed)
OK per MD for d/c today back to [facility] via EMS.  Ok per patient and her daughter.  Notified facility- bed is available.  Patient's nurse- Danielle Dess is aware and will call report to SNF. No further CSW needs identified. CSW signing off.  Lorri Frederick. [place]  [phone]

## 2013-05-28 ENCOUNTER — Emergency Department (HOSPITAL_COMMUNITY): Payer: Medicare Other

## 2013-05-28 ENCOUNTER — Encounter (HOSPITAL_COMMUNITY): Payer: Self-pay | Admitting: Emergency Medicine

## 2013-05-28 ENCOUNTER — Inpatient Hospital Stay (HOSPITAL_COMMUNITY)
Admission: EM | Admit: 2013-05-28 | Discharge: 2013-05-29 | DRG: 100 | Disposition: A | Discharge status: Skilled Nursing Facility | Payer: Medicare Other | Attending: Internal Medicine | Admitting: Internal Medicine

## 2013-05-28 DIAGNOSIS — E785 Hyperlipidemia, unspecified: Secondary | ICD-10-CM | POA: Diagnosis present

## 2013-05-28 DIAGNOSIS — Z950 Presence of cardiac pacemaker: Secondary | ICD-10-CM

## 2013-05-28 DIAGNOSIS — I447 Left bundle-branch block, unspecified: Secondary | ICD-10-CM | POA: Diagnosis present

## 2013-05-28 DIAGNOSIS — G9340 Encephalopathy, unspecified: Secondary | ICD-10-CM

## 2013-05-28 DIAGNOSIS — I10 Essential (primary) hypertension: Secondary | ICD-10-CM

## 2013-05-28 DIAGNOSIS — Z8673 Personal history of transient ischemic attack (TIA), and cerebral infarction without residual deficits: Secondary | ICD-10-CM

## 2013-05-28 DIAGNOSIS — R4182 Altered mental status, unspecified: Secondary | ICD-10-CM

## 2013-05-28 DIAGNOSIS — I509 Heart failure, unspecified: Secondary | ICD-10-CM | POA: Diagnosis present

## 2013-05-28 DIAGNOSIS — E876 Hypokalemia: Secondary | ICD-10-CM | POA: Diagnosis present

## 2013-05-28 DIAGNOSIS — E119 Type 2 diabetes mellitus without complications: Secondary | ICD-10-CM | POA: Diagnosis present

## 2013-05-28 DIAGNOSIS — G92 Toxic encephalopathy: Secondary | ICD-10-CM | POA: Diagnosis present

## 2013-05-28 DIAGNOSIS — I2510 Atherosclerotic heart disease of native coronary artery without angina pectoris: Secondary | ICD-10-CM

## 2013-05-28 DIAGNOSIS — T4275XA Adverse effect of unspecified antiepileptic and sedative-hypnotic drugs, initial encounter: Secondary | ICD-10-CM | POA: Diagnosis present

## 2013-05-28 DIAGNOSIS — Z794 Long term (current) use of insulin: Secondary | ICD-10-CM

## 2013-05-28 DIAGNOSIS — I639 Cerebral infarction, unspecified: Secondary | ICD-10-CM | POA: Diagnosis present

## 2013-05-28 DIAGNOSIS — R159 Full incontinence of feces: Secondary | ICD-10-CM | POA: Diagnosis present

## 2013-05-28 DIAGNOSIS — R32 Unspecified urinary incontinence: Secondary | ICD-10-CM | POA: Diagnosis present

## 2013-05-28 DIAGNOSIS — I5032 Chronic diastolic (congestive) heart failure: Secondary | ICD-10-CM | POA: Diagnosis present

## 2013-05-28 DIAGNOSIS — R7889 Finding of other specified substances, not normally found in blood: Secondary | ICD-10-CM

## 2013-05-28 DIAGNOSIS — R1310 Dysphagia, unspecified: Secondary | ICD-10-CM | POA: Diagnosis present

## 2013-05-28 DIAGNOSIS — G40909 Epilepsy, unspecified, not intractable, without status epilepticus: Principal | ICD-10-CM

## 2013-05-28 DIAGNOSIS — Z23 Encounter for immunization: Secondary | ICD-10-CM

## 2013-05-28 DIAGNOSIS — Z7982 Long term (current) use of aspirin: Secondary | ICD-10-CM

## 2013-05-28 DIAGNOSIS — G929 Unspecified toxic encephalopathy: Secondary | ICD-10-CM | POA: Diagnosis present

## 2013-05-28 DIAGNOSIS — I252 Old myocardial infarction: Secondary | ICD-10-CM

## 2013-05-28 DIAGNOSIS — I672 Cerebral atherosclerosis: Secondary | ICD-10-CM | POA: Diagnosis present

## 2013-05-28 DIAGNOSIS — Z79899 Other long term (current) drug therapy: Secondary | ICD-10-CM

## 2013-05-28 DIAGNOSIS — N289 Disorder of kidney and ureter, unspecified: Secondary | ICD-10-CM | POA: Diagnosis present

## 2013-05-28 DIAGNOSIS — F0150 Vascular dementia without behavioral disturbance: Secondary | ICD-10-CM

## 2013-05-28 LAB — PROTIME-INR
INR: 1.01 (ref 0.00–1.49)
Prothrombin Time: 13.1 seconds (ref 11.6–15.2)

## 2013-05-28 LAB — COMPREHENSIVE METABOLIC PANEL
ALT: 6 U/L (ref 0–35)
Albumin: 3.1 g/dL — ABNORMAL LOW (ref 3.5–5.2)
Alkaline Phosphatase: 144 U/L — ABNORMAL HIGH (ref 39–117)
Potassium: 3.2 mEq/L — ABNORMAL LOW (ref 3.5–5.1)
Sodium: 133 mEq/L — ABNORMAL LOW (ref 135–145)
Total Protein: 8 g/dL (ref 6.0–8.3)

## 2013-05-28 LAB — POCT I-STAT, CHEM 8
BUN: 25 mg/dL — ABNORMAL HIGH (ref 6–23)
Calcium, Ion: 1.09 mmol/L — ABNORMAL LOW (ref 1.13–1.30)
Creatinine, Ser: 1.1 mg/dL (ref 0.50–1.10)
Hemoglobin: 12.2 g/dL (ref 12.0–15.0)
TCO2: 25 mmol/L (ref 0–100)

## 2013-05-28 LAB — URINALYSIS, ROUTINE W REFLEX MICROSCOPIC
Bilirubin Urine: NEGATIVE
Glucose, UA: NEGATIVE mg/dL
Ketones, ur: NEGATIVE mg/dL
Leukocytes, UA: NEGATIVE
Protein, ur: NEGATIVE mg/dL

## 2013-05-28 LAB — GLUCOSE, CAPILLARY
Glucose-Capillary: 262 mg/dL — ABNORMAL HIGH (ref 70–99)
Glucose-Capillary: 68 mg/dL — ABNORMAL LOW (ref 70–99)

## 2013-05-28 LAB — CBC
MCH: 32.2 pg (ref 26.0–34.0)
MCHC: 34.4 g/dL (ref 30.0–36.0)
Platelets: 236 10*3/uL (ref 150–400)

## 2013-05-28 LAB — RAPID URINE DRUG SCREEN, HOSP PERFORMED
Amphetamines: NOT DETECTED
Barbiturates: NOT DETECTED
Benzodiazepines: NOT DETECTED

## 2013-05-28 LAB — DIFFERENTIAL
Basophils Relative: 0 % (ref 0–1)
Eosinophils Absolute: 0.1 10*3/uL (ref 0.0–0.7)
Neutrophils Relative %: 60 % (ref 43–77)

## 2013-05-28 LAB — CARBAMAZEPINE LEVEL, TOTAL: Carbamazepine Lvl: 16.9 ug/mL (ref 4.0–12.0)

## 2013-05-28 LAB — TROPONIN I: Troponin I: <0.3 ng/mL (ref <0.30)

## 2013-05-28 MED ORDER — ISOSORBIDE MONONITRATE ER 60 MG PO TB24
60.0000 mg | ORAL_TABLET | Freq: Every day | ORAL | Status: DC
  Administered 2013-05-28 – 2013-05-29 (×2): 60 mg via ORAL
  Filled 2013-05-28 (×2): qty 1

## 2013-05-28 MED ORDER — ASPIRIN EC 81 MG PO TBEC
81.0000 mg | DELAYED_RELEASE_TABLET | Freq: Every day | ORAL | Status: DC
  Administered 2013-05-28 – 2013-05-29 (×2): 81 mg via ORAL
  Filled 2013-05-28 (×2): qty 1

## 2013-05-28 MED ORDER — SIMVASTATIN 20 MG PO TABS
20.0000 mg | ORAL_TABLET | Freq: Every day | ORAL | Status: DC
  Administered 2013-05-28: 20 mg via ORAL
  Filled 2013-05-28 (×2): qty 1

## 2013-05-28 MED ORDER — ACETAMINOPHEN 650 MG RE SUPP: 650.0000 mg | Freq: Four times a day (QID) | RECTAL | Status: DC | PRN

## 2013-05-28 MED ORDER — ALCAFTADINE 0.25 % OP SOLN: 1.0000 [drp] | Freq: Every day | OPHTHALMIC | Status: DC

## 2013-05-28 MED ORDER — SODIUM CHLORIDE 0.9 % IV SOLN
500.0000 mg | Freq: Two times a day (BID) | INTRAVENOUS | Status: DC
  Administered 2013-05-28 – 2013-05-29 (×2): 500 mg via INTRAVENOUS
  Filled 2013-05-28 (×4): qty 5

## 2013-05-28 MED ORDER — SODIUM CHLORIDE 0.9 % IV SOLN
500.0000 mg | Freq: Two times a day (BID) | INTRAVENOUS | Status: DC
  Administered 2013-05-28: 500 mg via INTRAVENOUS
  Filled 2013-05-28 (×2): qty 5

## 2013-05-28 MED ORDER — NITROGLYCERIN 0.4 MG SL SUBL: 0.4000 mg | SUBLINGUAL_TABLET | SUBLINGUAL | Status: DC | PRN

## 2013-05-28 MED ORDER — SODIUM CHLORIDE 0.9 % IV SOLN: INTRAVENOUS | Status: AC

## 2013-05-28 MED ORDER — HEPARIN SODIUM (PORCINE) 5000 UNIT/ML IJ SOLN
5000.0000 [IU] | Freq: Three times a day (TID) | INTRAMUSCULAR | Status: DC
  Administered 2013-05-28 – 2013-05-29 (×3): 5000 [IU] via SUBCUTANEOUS
  Filled 2013-05-28 (×6): qty 1

## 2013-05-28 MED ORDER — ONDANSETRON HCL 4 MG PO TABS: 4.0000 mg | ORAL_TABLET | Freq: Four times a day (QID) | ORAL | Status: DC | PRN

## 2013-05-28 MED ORDER — MAGNESIUM SULFATE 40 MG/ML IJ SOLN
2.0000 g | Freq: Once | INTRAMUSCULAR | Status: AC
  Administered 2013-05-28: 2 g via INTRAVENOUS
  Filled 2013-05-28: qty 50

## 2013-05-28 MED ORDER — POTASSIUM CHLORIDE 10 MEQ/100ML IV SOLN
10.0000 meq | Freq: Once | INTRAVENOUS | Status: AC
  Administered 2013-05-28: 10 meq via INTRAVENOUS
  Filled 2013-05-28: qty 100

## 2013-05-28 MED ORDER — VITAMIN D3 25 MCG (1000 UNIT) PO TABS
1000.0000 [IU] | ORAL_TABLET | Freq: Two times a day (BID) | ORAL | Status: DC
  Administered 2013-05-28 – 2013-05-29 (×3): 1000 [IU] via ORAL
  Filled 2013-05-28 (×4): qty 1

## 2013-05-28 MED ORDER — IRBESARTAN 300 MG PO TABS
300.0000 mg | ORAL_TABLET | Freq: Every day | ORAL | Status: DC
  Administered 2013-05-28 – 2013-05-29 (×2): 300 mg via ORAL
  Filled 2013-05-28 (×2): qty 1

## 2013-05-28 MED ORDER — POTASSIUM CHLORIDE 10 MEQ/100ML IV SOLN
10.0000 meq | INTRAVENOUS | Status: AC
  Administered 2013-05-28: 10 meq via INTRAVENOUS
  Filled 2013-05-28 (×2): qty 100

## 2013-05-28 MED ORDER — ONDANSETRON HCL 4 MG/2ML IJ SOLN
4.0000 mg | Freq: Once | INTRAMUSCULAR | Status: AC
  Administered 2013-05-28: 4 mg via INTRAVENOUS
  Filled 2013-05-28: qty 2

## 2013-05-28 MED ORDER — SODIUM CHLORIDE 0.9 % IJ SOLN
3.0000 mL | Freq: Two times a day (BID) | INTRAMUSCULAR | Status: DC
  Administered 2013-05-28: 3 mL via INTRAVENOUS

## 2013-05-28 MED ORDER — LEVETIRACETAM 500 MG/5ML IV SOLN: 500.0000 mg | Freq: Two times a day (BID) | INTRAVENOUS | Status: DC

## 2013-05-28 MED ORDER — ONDANSETRON HCL 4 MG/2ML IJ SOLN: 4.0000 mg | Freq: Four times a day (QID) | INTRAMUSCULAR | Status: DC | PRN

## 2013-05-28 MED ORDER — ACETAMINOPHEN 325 MG PO TABS: 650.0000 mg | ORAL_TABLET | Freq: Four times a day (QID) | ORAL | Status: DC | PRN

## 2013-05-28 MED ORDER — NAPHAZOLINE-PHENIRAMINE 0.025-0.3 % OP SOLN
1.0000 [drp] | Freq: Every day | OPHTHALMIC | Status: DC
  Administered 2013-05-28 – 2013-05-29 (×2): 1 [drp] via OPHTHALMIC
  Filled 2013-05-28: qty 15

## 2013-05-28 MED ORDER — METOPROLOL SUCCINATE ER 50 MG PO TB24
50.0000 mg | ORAL_TABLET | Freq: Two times a day (BID) | ORAL | Status: DC
  Administered 2013-05-28 – 2013-05-29 (×3): 50 mg via ORAL
  Filled 2013-05-28 (×4): qty 1

## 2013-05-28 MED ORDER — SODIUM CHLORIDE 0.9 % IV BOLUS (SEPSIS): 1000.0000 mL | Freq: Once | INTRAVENOUS | Status: DC

## 2013-05-28 MED ORDER — SODIUM CHLORIDE 0.9 % IV SOLN
Freq: Once | INTRAVENOUS | Status: AC
  Administered 2013-05-28: 14:00:00 via INTRAVENOUS

## 2013-05-28 MED ORDER — PANTOPRAZOLE SODIUM 40 MG PO TBEC
40.0000 mg | DELAYED_RELEASE_TABLET | Freq: Every day | ORAL | Status: DC
  Administered 2013-05-28 – 2013-05-29 (×2): 40 mg via ORAL
  Filled 2013-05-28 (×2): qty 1

## 2013-05-28 MED ORDER — MOMETASONE FUROATE 0.1 % EX OINT
1.0000 "application " | TOPICAL_OINTMENT | Freq: Every day | CUTANEOUS | Status: DC | PRN
  Filled 2013-05-28: qty 1

## 2013-05-28 MED ORDER — INSULIN ASPART 100 UNIT/ML ~~LOC~~ SOLN
0.0000 [IU] | Freq: Three times a day (TID) | SUBCUTANEOUS | Status: DC
  Administered 2013-05-28: 2 [IU] via SUBCUTANEOUS
  Administered 2013-05-29: 1 [IU] via SUBCUTANEOUS

## 2013-05-28 NOTE — ED Notes (Signed)
Code stroke called at 11:11. Pt arrival at 11:20. Last well known at 0940. EDP exam at 1120. Stroke team arrival 1123. Neurologist arrival 1123. Pt arrival in CT 1123. Phlebotomist arrival 1121. Code stroke cancelled at 1142.

## 2013-05-28 NOTE — ED Provider Notes (Signed)
History    CSN: [id number] Arrival date & time 05/28/13  1120  First MD Initiated Contact with Patient 05/28/13 1136     Chief Complaint  Patient presents with  . Code Stroke   (Consider location/radiation/quality/duration/timing/severity/associated sxs/prior Treatment) HPI 79 year old female presents to emergency room via EMS with reported slurred speech and altered mental status.  Patient has history of dementia, stroke, seizures.  It is reported the patient is normally active and talkative.  Last seen normal approximately 940 this morning.  Pt called out as code stroke, seen by Dr Roseanne Reno with neurology.  He does not feel this is acute stroke, concerned for post seizure given incontinence.  Pt c/o abd pain, actively vomiting after return from CT scan.  No new stroke seen on CT.  Pt is confused.    Past Medical History  Diagnosis Date  . Hypertension   . Acid reflux   . Seizures   . Coronary artery disease 2003    60-75%D2, 50% D1, 25% left circ, 20% LAD, normal LVF  . Dyslipidemia   . LBBB (left bundle branch block)   . Confusion   . Pacemaker   . Fall at nursing home 03/29/2013  . Type II diabetes mellitus   . Myocardial infarction     "long time ago" (03/31/2013)  . Exertional shortness of breath     "once in awhile" (03/31/2013)  . Anxiety   . Multi-infarct dementia   . Acute pulmonary edema   . Stroke     dennies residual 03/31/2013   Past Surgical History  Procedure Laterality Date  . Cholecystectomy    . Vaginal hysterectomy    . Appendectomy    . Iridectomy    . Cardiac catheterization    . Eye surgery      cataracts/ iridectomy  . Tonsillectomy and adenoidectomy    . Mastoidectomy Right   . Cataract extraction w/ intraocular lens  implant, bilateral    . Dilation and curettage of uterus    . Tubal ligation    . Insert / replace / remove pacemaker  06/20/2012   No family history on file. History  Substance Use Topics  . Smoking status: Never Smoker   .  Smokeless tobacco: Never Used  . Alcohol Use: No   OB History   Grav Para Term Preterm Abortions TAB SAB Ect Mult Living                 Review of Systems  Unable to perform ROS: Mental status change    Allergies  Review of patient's allergies indicates no known allergies.  Home Medications   Current Outpatient Rx  Name  Route  Sig  Dispense  Refill  . Alcaftadine (LASTACAFT) 0.25 % SOLN   Both Eyes   Place 1 drop into both eyes daily.          Marland Kitchen ALPRAZolam (XANAX) 0.25 MG tablet   Oral   Take 0.125 mg by mouth every 8 (eight) hours as needed for anxiety.         Marland Kitchen aspirin EC 81 MG EC tablet   Oral   Take 1 tablet (81 mg total) by mouth daily.   30 tablet   0   . carbamazepine (TEGRETOL) 200 MG tablet   Oral   Take 400 mg by mouth 2 (two) times daily.         . cholecalciferol (VITAMIN D) 1000 UNITS tablet   Oral   Take 1,000 Units by  mouth 2 (two) times daily.         . furosemide (LASIX) 40 MG tablet   Oral   Take 1 tablet (40 mg total) by mouth daily.   30 tablet   1   . glyBURIDE (DIABETA) 2.5 MG tablet   Oral   Take 2.5 mg by mouth every morning.          . insulin aspart (NOVOLOG) 100 UNIT/ML injection   Subcutaneous   Inject 0-10 Units into the skin 3 (three) times daily with meals. Blood sugar under 150, give 0 units; 150-200 give 2 units; 201-250 give 4 units; 251-300 give 6 units, 301-350 give 8 units, and 351--400 give 10 units.  Above 400, call MD.         . isosorbide mononitrate (IMDUR) 30 MG 24 hr tablet   Oral   Take 60 mg by mouth daily.         . metoprolol succinate (TOPROL-XL) 25 MG 24 hr tablet   Oral   Take 25 mg by mouth 2 (two) times daily.         . mometasone (ELOCON) 0.1 % ointment   Topical   Apply 1 application topically daily. Apply around and to skin of eyelids for 1 week courses when inflammation occurs, do not get this medication in the eyes         . nitroGLYCERIN (NITROSTAT) 0.4 MG SL tablet    Sublingual   Place 0.4 mg under the tongue every 5 (five) minutes x 3 doses as needed. For chest pain         . omeprazole (PRILOSEC) 20 MG capsule   Oral   Take 20 mg by mouth daily before breakfast. Take on empty stomach at 630 am         . simvastatin (ZOCOR) 20 MG tablet   Oral   Take 20 mg by mouth at bedtime.         Marland Kitchen telmisartan (MICARDIS) 80 MG tablet   Oral   Take 80 mg by mouth daily.          BP 180/65  Pulse 62  Temp(Src) 97.4 F (36.3 C) (Oral)  Resp 16  SpO2 97% Physical Exam  Constitutional: She is oriented to person, place, and time. She appears distressed.  Actively vomiting  HENT:  Head: Normocephalic and atraumatic.  Nose: Nose normal.  Mouth/Throat: Oropharynx is clear and moist.  Eyes: Conjunctivae and EOM are normal. Pupils are equal, round, and reactive to light.  No nystagmus noted  Neck: Normal range of motion. Neck supple. No JVD present. No tracheal deviation present. No thyromegaly present.  Pulmonary/Chest: No stridor.  Abdominal: Soft. She exhibits no distension and no mass. There is no rebound and no guarding.  Hyperactive bowel sounds  Musculoskeletal: Normal range of motion. She exhibits no edema and no tenderness.  Lymphadenopathy:    She has no cervical adenopathy.  Neurological: She is alert and oriented to person, place, and time. She displays normal reflexes. No cranial nerve deficit. She exhibits normal muscle tone. Coordination normal.  Skin: Skin is warm. No rash noted. She is diaphoretic. No erythema. No pallor.    ED Course  Procedures (including critical care time) Labs Reviewed  CBC - Abnormal; Notable for the following:    RBC 3.57 (*)    Hemoglobin 11.5 (*)    HCT 33.4 (*)    All other components within normal limits  COMPREHENSIVE METABOLIC PANEL - Abnormal; Notable  for the following:    Sodium 133 (*)    Potassium 3.2 (*)    Chloride 94 (*)    Glucose, Bld 262 (*)    BUN 24 (*)    Albumin 3.1 (*)     Alkaline Phosphatase 144 (*)    GFR calc non Af Amer 50 (*)    GFR calc Af Amer 58 (*)    All other components within normal limits  CARBAMAZEPINE LEVEL, TOTAL - Abnormal; Notable for the following:    Carbamazepine Lvl 16.9 (*)    All other components within normal limits  LIPASE, BLOOD - Abnormal; Notable for the following:    Lipase 8 (*)    All other components within normal limits  GLUCOSE, CAPILLARY - Abnormal; Notable for the following:    Glucose-Capillary 262 (*)    All other components within normal limits  MAGNESIUM - Abnormal; Notable for the following:    Magnesium 1.4 (*)    All other components within normal limits  POCT I-STAT, CHEM 8 - Abnormal; Notable for the following:    Potassium 3.3 (*)    BUN 25 (*)    Glucose, Bld 275 (*)    Calcium, Ion 1.09 (*)    All other components within normal limits  ETHANOL  PROTIME-INR  APTT  DIFFERENTIAL  TROPONIN I  URINE RAPID DRUG SCREEN (HOSP PERFORMED)  URINALYSIS, ROUTINE W REFLEX MICROSCOPIC  PHOSPHORUS  POCT I-STAT TROPONIN I   Ct Head Wo Contrast  05/28/2013   *RADIOLOGY REPORT*  Clinical Data: Decreased level of consciousness with speech changes.  Code stroke.  CT HEAD WITHOUT CONTRAST  Technique:  Contiguous axial images were obtained from the base of the skull through the vertex without contrast.  Comparison: Head CT 04/02/2013.  Findings: Old right parietal and left temporal occipital infarcts are stable.  There are stable chronic infarcts in the right lentiform nuclei.  No acute intracranial hemorrhage, mass lesion, brain edema or extra-axial fluid collection is seen.  There is no evidence of acute cortical based infarct.  Generalized atrophy is unchanged.  There are postsurgical changes status post right mastoid resection. Paranasal sinus postsurgical changes are also present.  There is diffuse calvarial hyperostosis.  IMPRESSION:  1.  Stable chronic findings with old infarcts bilaterally as described. 2.  No CT  evidence of acute infarction or hemorrhage.  Critical Value/emergent results were called by telephone at the time of interpretation on 05/28/2013 at 1127 hours to Dr. Roseanne Reno, who verbally acknowledged these results.   Original Report Authenticated By: Carey Bullocks, M.D.    Date: 05/28/2013  Rate: 64  Rhythm: normal sinus rhythm  QRS Axis: left  Intervals: PR prolonged  ST/T Wave abnormalities: normal  Conduction Disutrbances:first-degree A-V block  and left bundle branch block  Narrative Interpretation: PR longer than prior  Old EKG Reviewed: changes noted    1. Altered mental status   2. Elevated Tegretol level     MDM  79 yo female with episode of altered mental status this morning at nursing facility, initially called out as code stroke, but thought to be due to seizure, h/o same.  Code stroke cancelled.  Pt with n/v currently.  Will get full labs, treat for nausea, continue to monitor.  1:34 PM Pt with elevated tegretol, which may explain her ams and vomiting.  No widened qrs requiring bicarb.  Does not appear to be acute ingestion.  Will give gentle ivf given h/o chf.  Will d/w hospitalist for admission.  Olivia Mackie, MD 05/28/13 [phone]

## 2013-05-28 NOTE — Evaluation (Signed)
Clinical/Bedside Swallow Evaluation Patient Details  Name: Brandon Scarbrough MRN: [id number] Date of Birth: 03/31/1928  Today's Date: 05/28/2013 Time: 5621-3086 SLP Time Calculation (min): 12 min  Past Medical History:  Past Medical History  Diagnosis Date  . Hypertension   . Acid reflux   . Seizures   . Coronary artery disease 2003    60-75%D2, 50% D1, 25% left circ, 20% LAD, normal LVF  . Dyslipidemia   . LBBB (left bundle branch block)   . Confusion   . Pacemaker   . Fall at nursing home 03/29/2013  . Type II diabetes mellitus   . Myocardial infarction     "long time ago" (03/31/2013)  . Exertional shortness of breath     "once in awhile" (03/31/2013)  . Anxiety   . Multi-infarct dementia   . Acute pulmonary edema   . Stroke     dennies residual 03/31/2013   Past Surgical History:  Past Surgical History  Procedure Laterality Date  . Cholecystectomy    . Vaginal hysterectomy    . Appendectomy    . Iridectomy    . Cardiac catheterization    . Eye surgery      cataracts/ iridectomy  . Tonsillectomy and adenoidectomy    . Mastoidectomy Right   . Cataract extraction w/ intraocular lens  implant, bilateral    . Dilation and curettage of uterus    . Tubal ligation    . Insert / replace / remove pacemaker  06/20/2012   HPI:  79 year old female with a history of diastolic CHF, CAD, status order, diabetes mellitus, hypertension, sinus node dysfunction, and multi-infarct dementia presented with altered mental status. The patient is unable to provide any significant history due to her dementia. In addition, the patient remained somewhat encephalopathic.  CT negative for acute findings; confirms chronic bilateral infarcts.   Assessment / Plan / Recommendation Clinical Impression  Pt with inconsistent response to POs - demonstrated fluctuating recognition of and motor response to purees and liquids.  At times throughout assessment, pt demonstrated no reaction to straw or spoon placed  against lips; she would elevate eyes/head to ceiling and could not follow commands.  Alternatively, she would accept PO bolus, activate an efficient swallow response, and present with good airway protection.   For now, until MS becomes more consistent, initiate dysphagia 1 diet with thin liquids; assist with feeding as needed; meds crushed with purees; and hold POs if MS is prohibitive of safe eating.  SLP will follow.     Aspiration Risk  Mild    Diet Recommendation Dysphagia 1 (Puree);Thin liquid   Liquid Administration via: Straw;Cup Medication Administration: Crushed with puree Supervision: Full supervision/cueing for compensatory strategies Compensations: Small sips/bites Postural Changes and/or Swallow Maneuvers: Seated upright 90 degrees    Other  Recommendations Oral Care Recommendations: Oral care BID   Follow Up Recommendations  Other (comment) (tba)    Frequency and Duration min 2x/week  1 week   Pertinent Vitals/Pain None indicated    SLP Swallow Goals Patient will utilize recommended strategies during swallow to increase swallowing safety with: Maximum assistance   Swallow Study Prior Functional Status       General Date of Onset: 05/27/13 HPI: 79 year old female with a history of diastolic CHF, CAD, status order, diabetes mellitus, hypertension, sinus node dysfunction, and multi-infarct dementia presented with altered mental status. The patient is unable to provide any significant history due to her dementia. In addition, the patient remained somewhat encephalopathic.  CT negative for acute  findings; confirms chronic bilateral infarcts. Type of Study: Bedside swallow evaluation Previous Swallow Assessment: none per records Diet Prior to this Study: NPO Temperature Spikes Noted: No Behavior/Cognition: Alert;Confused Oral Cavity - Dentition: Dentures, top;Dentures, bottom Self-Feeding Abilities: Total assist Patient Positioning: Upright in bed Baseline Vocal  Quality: Clear Volitional Cough: Strong Volitional Swallow: Unable to elicit    Oral/Motor/Sensory Function Overall Oral Motor/Sensory Function: Appears within functional limits for tasks assessed   Ice Chips Ice chips: Within functional limits Presentation: Spoon   Thin Liquid Thin Liquid: Impaired Presentation: Straw Oral Phase Impairments: Poor awareness of bolus    Nectar Thick Nectar Thick Liquid: Not tested   Honey Thick Honey Thick Liquid: Not tested   Puree Puree: Impaired Presentation: Spoon Oral Phase Impairments: Poor awareness of bolus   Solid   GO    Solid: Not tested      Lorella Gomez L. Samson Frederic, [place] CCC/SLP Pager [phone]  Blenda Mounts Laurice 05/28/2013,3:50 PM

## 2013-05-28 NOTE — ED Notes (Signed)
Per EMS pt came from [facility] nursing facility where staff noticed slurred speech and altered mental status. Pt has hx of CVA, dementia and seizures. Staff reports pt is normally active and talkative. Speech garbled with EMS, pt unable to follow commands. Pt incontinent and vomited en route.

## 2013-05-28 NOTE — Code Documentation (Addendum)
79 year old resident of SNF who was LSW at 25.  Later staff returned to find her confused and slurred speech.  EMS was called and Code stroke was called in the field at 1109 with ETA 7-10 mins.  See flowsheet for code stroke times.  On arrival to ED she was drowsy but arousable.  She had vomited in route to ED.  She was also incontinent of urine and feces.  CT scan done.  Patient had 2 episodes of emesis post CT scan.  Patient is becoming more alert - oriented to person and place.  She has fluent speech but remains sleepy - as if in post-ictal state.  She does have history of seizure disorder.  Gaze intact.  MAE x 4 spont and with good strength.  Will hold arms to count of 10 - difficulty bilaterally with legs - moves but does drift bilateral has good tone.  Uncooperative exam with following of commands - difficulty focusing on task.  CBG 200 in field - BP 210/98 in field.  180/65 now.  Denies headache.  States she feels lousy.  Dr. Roseanne Reno present - code stroke cancelled.

## 2013-05-28 NOTE — Consult Note (Signed)
Referring Physician: Norlene Campbell    Chief Complaint: Slurred speech and confusion presenting as code stroke.  HPI:                                                                                                                                         [name] [name] is an 79 y.o. female with multiple old infarcts, recent GI bleed, known seizure disorder on tegretol who was found with AMS and hard to arouse this AM.  She was last seen normal at 09:30 by nursing staff in the SNF she lives in. EMS was called and patient was brought to ED as a code stroke.  In route it was noted she was incontinent of both stool and urine. At time of arrival patient was moving all extremities and was able to verbalize she was in the hospital.  In CT scanner patient had one bout of emesis.  CT head was negative for acute infarct.   Date last known well: 6.25.14 Time last known well: 09:30 tPA Given: No: resolution of symptoms and recent GI bleed  Past Medical History  Diagnosis Date  . Hypertension   . Acid reflux   . Seizures   . Coronary artery disease 2003    60-75%D2, 50% D1, 25% left circ, 20% LAD, normal LVF  . Dyslipidemia   . LBBB (left bundle branch block)   . Confusion   . Pacemaker   . Fall at nursing home 03/29/2013  . Type II diabetes mellitus   . Myocardial infarction     "long time ago" (03/31/2013)  . Exertional shortness of breath     "once in awhile" (03/31/2013)  . Stroke     dennies residual 03/31/2013  . Anxiety   . Multi-infarct dementia   . Acute pulmonary edema     Past Surgical History  Procedure Laterality Date  . Cholecystectomy    . Vaginal hysterectomy    . Appendectomy    . Iridectomy    . Cardiac catheterization    . Eye surgery      cataracts/ iridectomy  . Tonsillectomy and adenoidectomy    . Mastoidectomy Right   . Cataract extraction w/ intraocular lens  implant, bilateral    . Dilation and curettage of uterus    . Tubal ligation    . Insert / replace / remove pacemaker   06/20/2012    Family history: Unable to obtain due to patients mental status and ability to cooperate Social History:  reports that she has never smoked. She has never used smokeless tobacco. She reports that she does not drink alcohol or use illicit drugs.  Allergies: No Known Allergies  Medications:  Current Facility-Administered Medications  Medication Dose Route Frequency Provider Last Rate Last Dose  . ondansetron (ZOFRAN) injection 4 mg  4 mg Intravenous Once Olivia Mackie, MD       Current Outpatient Prescriptions  Medication Sig Dispense Refill  . Alcaftadine (LASTACAFT) 0.25 % SOLN Place 1 drop into both eyes daily.       Marland Kitchen ALPRAZolam (XANAX) 0.25 MG tablet Take 0.125 mg by mouth every 8 (eight) hours as needed for anxiety.      Marland Kitchen aspirin EC 81 MG EC tablet Take 1 tablet (81 mg total) by mouth daily.  30 tablet  0  . carbamazepine (TEGRETOL) 200 MG tablet Take 400 mg by mouth 2 (two) times daily.      . cholecalciferol (VITAMIN D) 1000 UNITS tablet Take 1,000 Units by mouth 2 (two) times daily.      . furosemide (LASIX) 40 MG tablet Take 1 tablet (40 mg total) by mouth daily.  30 tablet  1  . glyBURIDE (DIABETA) 2.5 MG tablet Take 2.5 mg by mouth every morning.       . insulin aspart (NOVOLOG) 100 UNIT/ML injection Inject 0-10 Units into the skin 3 (three) times daily with meals. Blood sugar under 150, give 0 units; 150-200 give 2 units; 201-250 give 4 units; 251-300 give 6 units, 301-350 give 8 units, and 351--400 give 10 units.  Above 400, call MD.      . isosorbide mononitrate (IMDUR) 30 MG 24 hr tablet Take 60 mg by mouth daily.      . metoprolol succinate (TOPROL-XL) 25 MG 24 hr tablet Take 25 mg by mouth 2 (two) times daily.      . mometasone (ELOCON) 0.1 % ointment Apply 1 application topically daily. Apply around and to skin of eyelids for 1 week courses  when inflammation occurs, do not get this medication in the eyes      . nitroGLYCERIN (NITROSTAT) 0.4 MG SL tablet Place 0.4 mg under the tongue every 5 (five) minutes x 3 doses as needed. For chest pain      . omeprazole (PRILOSEC) 20 MG capsule Take 20 mg by mouth daily before breakfast. Take on empty stomach at 630 am      . simvastatin (ZOCOR) 20 MG tablet Take 20 mg by mouth at bedtime.      Marland Kitchen telmisartan (MICARDIS) 80 MG tablet Take 80 mg by mouth daily.         ROS:                                                                                                                                       History obtained from unobtainable from patient due to lack of cooperation   Neurologic Examination:  There were no vitals taken for this visit.  Mental Status: Alert, oriented to hospital, continues to state leave me alone when asked further questions. Speech fluent without evidence of aphasia.  Refuses to follow commands Cranial Nerves: [id number] fields grossly normal blinking to threat bilaterally, pupils equal, round, reactive to light and accommodation III,IV, VI: ptosis not present, extra-ocular motions intact bilaterally looking left and right when her name is called V,VII: smile symmetric, facial light touch sensation normal bilaterally VIII: hearing normal bilaterally XII: midline tongue extension Motor: Moving all extremities purposefully and spontaneously antigravity Right : Upper extremity   5/5    Left:     Upper extremity   5/5  Lower extremities show antigravity strength but patient shows bilateral drift and will not take part in formal exam.  Tone and bulk:normal tone throughout; no atrophy noted Deep Tendon Reflexes:  Right: Upper Extremity   Left: Upper extremity   biceps (C-5 to C-6) 2/4   biceps (C-5 to C-6) 2/4 tricep (C7) 2/4    triceps (C7) 2/4 Brachioradialis  (C6) 2/4  Brachioradialis (C6) 2/4  Lower Extremity Lower Extremity  quadriceps (L-2 to L-4) 1/4   quadriceps (L-2 to L-4) 1/4 Achilles (S1) 1/4   Achilles (S1) 1/4  Plantars: Right: downgoing   Left: downgoing Cerebellar: No ataxia noted when moving upper extremities Gait: not tested CV: pulses palpable throughout    No results found for this or any previous visit (from the past 48 hour(s)). Ct Head Wo Contrast  05/28/2013   *RADIOLOGY REPORT*  Clinical Data: Decreased level of consciousness with speech changes.  Code stroke.  CT HEAD WITHOUT CONTRAST  Technique:  Contiguous axial images were obtained from the base of the skull through the vertex without contrast.  Comparison: Head CT 04/02/2013.  Findings: Old right parietal and left temporal occipital infarcts are stable.  There are stable chronic infarcts in the right lentiform nuclei.  No acute intracranial hemorrhage, mass lesion, brain edema or extra-axial fluid collection is seen.  There is no evidence of acute cortical based infarct.  Generalized atrophy is unchanged.  There are postsurgical changes status post right mastoid resection. Paranasal sinus postsurgical changes are also present.  There is diffuse calvarial hyperostosis.  IMPRESSION:  1.  Stable chronic findings with old infarcts bilaterally as described. 2.  No CT evidence of acute infarction or hemorrhage.  Critical Value/emergent results were called by telephone at the time of interpretation on 05/28/2013 at 1127 hours to Dr. Roseanne Reno, who verbally acknowledged these results.   Original Report Authenticated By: Carey Bullocks, M.D.    Assessment and plan discussed with with attending physician and they are in agreement.    Felicie Morn PA-C [facility] [phone]  05/28/2013, 11:50 AM   Assessment: 79 y.o. female with known seizure disorder and multiple bilateral infarcts presenting to the hospital after being found with AMS, incontinent of both stool and  urine at her SNF.  CT head shows no acute bleed or infarct.  Likely breakthrough seizure presenting in postictal state. Patient had no clinical signs of acute stroke.  I have spoken to Dr. Norlene Campbell.  Will hold Tegretol until level  < 12.0 as current level is 16.9.  Will start Keppra IV 500 mg BID with first dose now.  Order has been entered.     Stroke Risk Factors - diabetes mellitus, hyperlipidemia and hypertension  Plan: 1. Tegretol level in AM, MG, Phos, UA  2. Hold Tegretol until level is back to 12.0 or  below 3. Start Keppra 500 mg BID IV with first dose now.  4. Prophylactic therapy-Antiplatelet med: Aspirin - current home dose 5. Seizure precautions   Felicie Morn PA-C [facility] [phone]  This patient was seen and evaluated by me including examination as well as formulation of clinical impression and management recommendations.  Venetia Maxon M.D. [facility] [phone]

## 2013-05-28 NOTE — H&P (Signed)
[facility] History and Physical  Kanchan Gal ZOX:[id number] DOB: 1928-10-08 DOA: 05/28/2013   PCP: Pearla Dubonnet, MD   Chief Complaint: altered mentation  HPI:  79 year old female with a history of diastolic CHF, CAD, status order, diabetes mellitus, hypertension, sinus node dysfunction, and multi-infarct dementia presented with altered mental status. The patient is unable to provide any significant history due to her dementia. In addition, the patient remained somewhat encephalopathic. All of this history is obtained from speaking with ED physician and review of records. Apparently, the patient was noted to be normal around 9:30 in the morning by the nursing staff at [facility]. However, the patient later was found to be altered with slurred speech. Code stroke was activated and the patient was brought to the hospital. Neurology was consulted to see the patient due to code stroke. Code Stroke was canceled after being evaluated by Dr. Roseanne Reno.  En route to hospital, patient was noted to be incontinent of both stool and urine. CT scan of the brain was negative for any acute abnormalities. The patient did have one episode of emesis during a CT. At the time of neurology evaluation, the patient was more awake and was able to verbalize. At the time of my evaluation, the patient was able to answer simple questions and was alert and oriented x 2. The patient denied any fevers, chills, headache, chest pain, shortness of breath, abdominal pain, dysuria. Neurology felt that the patient may have had a seizure and was postictal versus the possibility of her mental status do to Tegretol toxicity.  Workup in the emergency department revealed elevated Tegretol level of 16.9. BMP showed calcium 3.3, serum creatinine 1.10, ionized calcium 1.09. Urine drug screen was negative. Urinalysis did not have any significant pyuria. Magnesium was 1.4. Hepatic enzymes and lipase were unremarkable. Assessment/Plan: Acute  encephalopathy -Appears to be improving -At the time of my evaluation, the patient was alert and oriented x2 -Unclear what the patient's baseline mentation is, but clearly more alert than initial description by ED physician -Appreciate neurology recommendations -May be due to seizure/post-ictal vs tegretol toxicity -Urinalysis did not suggest UTI -Discontinue Tegretol per neurology recommendations, start Keppra IV -Swallow evaluation -Cycle troponins -Neurochecks Chronic diastolic CHF -Patient is well compensated -EF 50-55% on 03/30/2013 Renal insufficiency -Baseline creatinine 0.7-0.8 -We'll hold today's furosemide dose and recheck BMP in the morning -The patient was given 1 L NS in ED Coronary artery disease, history of stroke -Continue aspirin 81 mg daily -Continue statin Hypokalemia -Replete -May be due to a combination of furosemide and hypomagnesemia Hypomagnesemia -Replete Diabetes mellitus type 2 -NovoLog sliding scale for now -Discontinue glyburide -Hemoglobin A1c 5.7 on 04/18/2013 Sinus node dysfunction:  -Patient is s/p PPM 06/2012. Cardiac monitor reveals SR       Past Medical History  Diagnosis Date  . Hypertension   . Acid reflux   . Seizures   . Coronary artery disease 2003    60-75%D2, 50% D1, 25% left circ, 20% LAD, normal LVF  . Dyslipidemia   . LBBB (left bundle branch block)   . Confusion   . Pacemaker   . Fall at nursing home 03/29/2013  . Type II diabetes mellitus   . Myocardial infarction     "long time ago" (03/31/2013)  . Exertional shortness of breath     "once in awhile" (03/31/2013)  . Anxiety   . Multi-infarct dementia   . Acute pulmonary edema   . Stroke     dennies residual 03/31/2013   Past  Surgical History  Procedure Laterality Date  . Cholecystectomy    . Vaginal hysterectomy    . Appendectomy    . Iridectomy    . Cardiac catheterization    . Eye surgery      cataracts/ iridectomy  . Tonsillectomy and adenoidectomy     . Mastoidectomy Right   . Cataract extraction w/ intraocular lens  implant, bilateral    . Dilation and curettage of uterus    . Tubal ligation    . Insert / replace / remove pacemaker  06/20/2012   Social History:  reports that she has never smoked. She has never used smokeless tobacco. She reports that she does not drink alcohol or use illicit drugs.   No family history on file.   No Known Allergies    Prior to Admission medications   Medication Sig Start Date End Date Taking? Authorizing Provider  Alcaftadine (LASTACAFT) 0.25 % SOLN Place 1 drop into both eyes daily.    Yes Historical Provider, MD  ALPRAZolam (XANAX) 0.25 MG tablet Take 0.125 mg by mouth every 8 (eight) hours as needed for anxiety.   Yes Historical Provider, MD  aspirin EC 81 MG EC tablet Take 1 tablet (81 mg total) by mouth daily. 04/21/13  Yes Marden Noble, MD  carbamazepine (CARBATROL) 300 MG 12 hr capsule Take 600 mg by mouth every Monday, Wednesday, and Friday.   Yes Historical Provider, MD  carbamazepine (TEGRETOL XR) 400 MG 12 hr tablet Take 400 mg by mouth 4 (four) times a week. 1 tab twice daily Tues, Thrus, Sat, and Sun ONLY   Yes Historical Provider, MD  cholecalciferol (VITAMIN D) 1000 UNITS tablet Take 1,000 Units by mouth 2 (two) times daily.   Yes Historical Provider, MD  furosemide (LASIX) 40 MG tablet Take 1 tablet (40 mg total) by mouth daily. 04/21/13  Yes Marden Noble, MD  glyBURIDE (DIABETA) 2.5 MG tablet Take 2.5 mg by mouth every morning.    Yes Historical Provider, MD  insulin aspart (NOVOLOG) 100 UNIT/ML injection Inject 0-10 Units into the skin 3 (three) times daily with meals. Blood sugar under 150, give 0 units; 150-200 give 2 units; 201-250 give 4 units; 251-300 give 6 units, 301-350 give 8 units, and 351--400 give 10 units.  Above 400, call MD.   Yes Historical Provider, MD  isosorbide mononitrate (IMDUR) 30 MG 24 hr tablet Take 60 mg by mouth daily.   Yes Historical Provider, MD   metoprolol succinate (TOPROL-XL) 25 MG 24 hr tablet Take 50 mg by mouth 2 (two) times daily.    Yes Historical Provider, MD  mometasone (ELOCON) 0.1 % ointment Apply 1 application topically daily as needed (imflammation).    Yes Historical Provider, MD  nitroGLYCERIN (NITROSTAT) 0.4 MG SL tablet Place 0.4 mg under the tongue every 5 (five) minutes x 3 doses as needed. For chest pain   Yes Historical Provider, MD  omeprazole (PRILOSEC) 20 MG capsule Take 20 mg by mouth daily before breakfast. Take on empty stomach at 630 am   Yes Historical Provider, MD  simvastatin (ZOCOR) 20 MG tablet Take 20 mg by mouth at bedtime.   Yes Historical Provider, MD  telmisartan (MICARDIS) 80 MG tablet Take 80 mg by mouth daily.   Yes Historical Provider, MD    Review of Systems:  Limited due to the patient's acute encephalopathy  Physical Exam: Filed Vitals:   05/28/13 1329 05/28/13 1330 05/28/13 1400 05/28/13 1430  BP: 117/58 173/73 153/60 173/74  Pulse:  62  63 67  Temp:      TempSrc:      Resp: 14 14 13 16   SpO2: 97%  100% 100%   General:  A&O x 2, NAD, nontoxic, pleasant/cooperative Head/Eye: No conjunctival hemorrhage, no icterus, [place]/AT, No nystagmus ENT:  No icterus,  No thrush,no pharyngeal exudate Neck:  No masses, no lymphadenpathy, no bruits CV:  RRR, no rub, no gallop, no S3 Lung:  CTAB, good air movement, no wheeze, no rhonchi Abdomen: soft/NT, +BS, nondistended, no peritoneal signs Ext: No cyanosis, No rashes, No petechiae, No lymphangitis, No edema Neuro: CNII-XII intact, strength 4/5 in bilateral upper and lower extremities, no dysmetria  Labs on Admission:  Basic Metabolic Panel:  Recent Labs Lab 05/28/13 1156 05/28/13 1206 05/28/13 1214  NA 133*  --  136  K 3.2*  --  3.3*  CL 94*  --  98  CO2 25  --   --   GLUCOSE 262*  --  275*  BUN 24*  --  25*  CREATININE 1.01  --  1.10  CALCIUM 8.8  --   --   MG  --  1.4*  --   PHOS  --  3.4  --    Liver Function Tests:  Recent  Labs Lab 05/28/13 1156  AST 12  ALT 6  ALKPHOS 144*  BILITOT 0.4  PROT 8.0  ALBUMIN 3.1*    Recent Labs Lab 05/28/13 1206  LIPASE 8*   No results found for this basename: AMMONIA,  in the last 168 hours CBC:  Recent Labs Lab 05/28/13 1156 05/28/13 1214  WBC 6.3  --   NEUTROABS 3.8  --   HGB 11.5* 12.2  HCT 33.4* 36.0  MCV 93.6  --   PLT 236  --    Cardiac Enzymes:  Recent Labs Lab 05/28/13 1157  TROPONINI <0.30   BNP: No components found with this basename: POCBNP,  CBG:  Recent Labs Lab 05/28/13 1144  GLUCAP 262*    Radiological Exams on Admission: Ct Head Wo Contrast  05/28/2013   *RADIOLOGY REPORT*  Clinical Data: Decreased level of consciousness with speech changes.  Code stroke.  CT HEAD WITHOUT CONTRAST  Technique:  Contiguous axial images were obtained from the base of the skull through the vertex without contrast.  Comparison: Head CT 04/02/2013.  Findings: Old right parietal and left temporal occipital infarcts are stable.  There are stable chronic infarcts in the right lentiform nuclei.  No acute intracranial hemorrhage, mass lesion, brain edema or extra-axial fluid collection is seen.  There is no evidence of acute cortical based infarct.  Generalized atrophy is unchanged.  There are postsurgical changes status post right mastoid resection. Paranasal sinus postsurgical changes are also present.  There is diffuse calvarial hyperostosis.  IMPRESSION:  1.  Stable chronic findings with old infarcts bilaterally as described. 2.  No CT evidence of acute infarction or hemorrhage.  Critical Value/emergent results were called by telephone at the time of interpretation on 05/28/2013 at 1127 hours to Dr. Roseanne Reno, who verbally acknowledged these results.   Original Report Authenticated By: Carey Bullocks, M.D.    EKG: Independently reviewed. Left bundle branch block, first degree AV block, sinus rhythm    Time spent:60 minutes Code Status:   No Code  Blue Family Communication:   No Family at bedside   Sybel Standish, DO  [facility] Pager [phone]  If 7PM-7AM, please contact night-coverage [email or web address] Password [facility] 05/28/2013, 2:52 PM

## 2013-05-28 NOTE — Progress Notes (Signed)
Pt arrived to floor from ED. Upon assessment RN noted that pt would not follow finger with eyes. When RN asked pt to follow fingers she stated where are you, I dont see you. RN then asked pt to describe what she was seeing she said "just black". Bright lights overhead were on and pt stated there were no lights on. Unable to determine if pt has a history of blindness. Dr. Arbutus Leas and Dr. Roseanne Reno notified. No new orders given.

## 2013-05-29 LAB — BASIC METABOLIC PANEL
CO2: 26 mEq/L (ref 19–32)
Chloride: 100 mEq/L (ref 96–112)
Glucose, Bld: 97 mg/dL (ref 70–99)
Potassium: 3.4 mEq/L — ABNORMAL LOW (ref 3.5–5.1)
Sodium: 137 mEq/L (ref 135–145)

## 2013-05-29 LAB — GLUCOSE, CAPILLARY
Glucose-Capillary: 97 mg/dL (ref 70–99)
Glucose-Capillary: 104 mg/dL — ABNORMAL HIGH (ref 70–99)
Glucose-Capillary: 139 mg/dL — ABNORMAL HIGH (ref 70–99)

## 2013-05-29 LAB — TROPONIN I: Troponin I: <0.3 ng/mL (ref <0.30)

## 2013-05-29 MED ORDER — CARBAMAZEPINE 200 MG PO TABS: 400.0000 mg | ORAL_TABLET | Freq: Two times a day (BID) | ORAL | Status: DC

## 2013-05-29 MED ORDER — PNEUMOCOCCAL VAC POLYVALENT 25 MCG/0.5ML IJ INJ
0.5000 mL | INJECTION | Freq: Once | INTRAMUSCULAR | Status: DC
  Filled 2013-05-29: qty 0.5

## 2013-05-29 MED ORDER — PNEUMOCOCCAL VAC POLYVALENT 25 MCG/0.5ML IJ INJ
0.5000 mL | INJECTION | Freq: Once | INTRAMUSCULAR | Status: AC
  Administered 2013-05-29: 0.5 mL via INTRAMUSCULAR
  Filled 2013-05-29: qty 0.5

## 2013-05-29 MED ORDER — CARBAMAZEPINE 200 MG PO TABS
300.0000 mg | ORAL_TABLET | Freq: Two times a day (BID) | ORAL | Status: DC
  Administered 2013-05-29: 300 mg via ORAL
  Filled 2013-05-29 (×2): qty 1.5

## 2013-05-29 MED ORDER — NAPHAZOLINE-PHENIRAMINE 0.025-0.3 % OP SOLN: 1.0000 [drp] | Freq: Every day | OPHTHALMIC | Status: AC

## 2013-05-29 MED ORDER — PNEUMOCOCCAL VAC POLYVALENT 25 MCG/0.5ML IJ INJ: 0.5000 mL | INJECTION | INTRAMUSCULAR | Status: DC

## 2013-05-29 NOTE — Progress Notes (Signed)
Speech Language Pathology Dysphagia Treatment Patient Details Name: [name] [name] MRN: [id number] DOB: Nov 13, 1928 Today's Date: 05/29/2013 Time: 1100-1120 SLP Time Calculation (min): 20 min  Assessment / Plan / Recommendation Clinical Impression  Pt alterted to greeting, remained altert throughout sessions with supervision cues. Pt tolerated single thin liquids sips via straw and puree without difficultuty. Mildly prolonged mastication  with graham cracker. Nsg reported pt tolerated her am meal without overt difficulty. No s/s of aspiration at this time.     Diet Recommendation  Initiate / Change Diet: Dysphagia 2 (fine chop);Thin liquid    SLP Plan Continue with current plan of care   Pertinent Vitals/Pain Pt denied pain   Swallowing Goals  SLP Swallowing Goals Patient will consume recommended diet without observed clinical signs of aspiration with: Moderate assistance Swallow Study Goal #1 - Progress: Partly Met Patient will utilize recommended strategies during swallow to increase swallowing safety with: Moderate assistance Swallow Study Goal #2 - Progress: Partly met  General Temperature Spikes Noted: No Respiratory Status: Room air Behavior/Cognition: Alert;Cooperative;Pleasant mood;Confused Oral Cavity - Dentition: Dentures, top;Dentures, bottom Patient Positioning: Upright in bed  Oral Cavity - Oral Hygiene Does patient have any of the following "at risk" factors?: Saliva - thick, dry mouth Brush patient's teeth BID with toothbrush (using toothpaste with fluoride): Yes   Dysphagia Treatment Treatment focused on: Upgraded PO texture trials;Skilled observation of diet tolerance Treatment Methods/Modalities: Skilled observation Patient observed directly with PO's: Yes Type of PO's observed: Dysphagia 3 (soft);Thin liquids;Dysphagia 1 (puree) Feeding: Needs assist Liquids provided via: Straw Oral Phase Signs & Symptoms: Prolonged oral phase Type of cueing: Verbal Amount of  cueing: Minimal   GO Functional Limitations: Swallowing Swallow Current Status (W0981): At least 40 percent but less than 60 percent impaired, limited or restricted Swallow Goal Status [phone]): At least 20 percent but less than 40 percent impaired, limited or restricted   [name] [name], Radene Journey 05/29/2013, 11:29 AM

## 2013-05-29 NOTE — Progress Notes (Signed)
NEURO HOSPITALIST PROGRESS NOTE   SUBJECTIVE:                                                                                                                        Patient cannot recall any of yesterdays events.  She remembers waking this morning.  She is sitting up and eating breakfast without difficulty. No further seizures  OBJECTIVE:                                                                                                                           Vital signs in last 24 hours: Temp:  [97.4 F (36.3 C)-98 F (36.7 C)] 97.8 F (36.6 C) (06/26 0624) Pulse Rate:  [59-67] 60 (06/26 0624) Resp:  [12-28] 19 (06/26 0624) BP: (117-180)/(46-74) 129/46 mmHg (06/26 0624) SpO2:  [97 %-100 %] 98 % (06/26 0624)  Intake/Output from previous day: 06/25 0701 - 06/26 0700 In: 625 [I.V.:625] Out: -  Intake/Output this shift:   Nutritional status: Dysphagia  Past Medical History  Diagnosis Date  . Hypertension   . Acid reflux   . Seizures   . Coronary artery disease 2003    60-75%D2, 50% D1, 25% left circ, 20% LAD, normal LVF  . Dyslipidemia   . LBBB (left bundle branch block)   . Confusion   . Pacemaker   . Fall at nursing home 03/29/2013  . Type II diabetes mellitus   . Myocardial infarction     "long time ago" (03/31/2013)  . Exertional shortness of breath     "once in awhile" (03/31/2013)  . Anxiety   . Multi-infarct dementia   . Acute pulmonary edema   . Stroke     dennies residual 03/31/2013     Neurologic Exam:   Mental Status: Alert, not oriented to place, year, month.  Speech fluent without evidence of aphasia.  Able to follow 3 step commands without difficulty. Cranial Nerves: II: Visual fields difficult to assess but appears to have right field cut and decreased vision on both peripheral fields. pupils equal, round, reactive to light and accommodation III,IV, VI: ptosis not present, extra-ocular motions intact  bilaterally V,VII: smile symmetric, facial light touch sensation normal bilaterally VIII: hearing normal bilaterally IX,X: gag reflex present  XI: bilateral shoulder shrug XII: midline tongue extension Motor: Right : Upper extremity   5/5    Left:     Upper extremity   5/5  Lower extremity   5/5     Lower extremity   5/5 Tone and bulk:normal tone throughout; no atrophy noted Sensory: Pinprick and light touch intact throughout, bilaterally Deep Tendon Reflexes:  Right: Upper Extremity   Left: Upper extremity   biceps (C-5 to C-6) 2/4   biceps (C-5 to C-6) 2/4 tricep (C7) 2/4    triceps (C7) 2/4 Brachioradialis (C6) 2/4  Brachioradialis (C6) 2/4  Lower Extremity Lower Extremity  quadriceps (L-2 to L-4) 2/4   quadriceps (L-2 to L-4) 2/4 Achilles (S1) 1/4   Achilles (S1) 1/4  Plantars: Right: downgoing   Left: downgoing Cerebellar: normal finger-to-nose,    Lab Results: Lab Results  Component Value Date/Time   CHOL  Value: 163        ATP III CLASSIFICATION:  <200     mg/dL   Desirable  161-096  mg/dL   Borderline High  >=045    mg/dL   High        4/0/9811  1:37 AM   Lipid Panel No results found for this basename: CHOL, TRIG, HDL, CHOLHDL, VLDL, LDLCALC,  in the last 72 hours  Studies/Results: Ct Head Wo Contrast  05/28/2013   *RADIOLOGY REPORT*  Clinical Data: Decreased level of consciousness with speech changes.  Code stroke.  CT HEAD WITHOUT CONTRAST  Technique:  Contiguous axial images were obtained from the base of the skull through the vertex without contrast.  Comparison: Head CT 04/02/2013.  Findings: Old right parietal and left temporal occipital infarcts are stable.  There are stable chronic infarcts in the right lentiform nuclei.  No acute intracranial hemorrhage, mass lesion, brain edema or extra-axial fluid collection is seen.  There is no evidence of acute cortical based infarct.  Generalized atrophy is unchanged.  There are postsurgical changes status post right mastoid  resection. Paranasal sinus postsurgical changes are also present.  There is diffuse calvarial hyperostosis.  IMPRESSION:  1.  Stable chronic findings with old infarcts bilaterally as described. 2.  No CT evidence of acute infarction or hemorrhage.  Critical Value/emergent results were called by telephone at the time of interpretation on 05/28/2013 at 1127 hours to Dr. Roseanne Reno, who verbally acknowledged these results.   Original Report Authenticated By: Carey Bullocks, M.D.    MEDICATIONS                                                                                                                        Scheduled: . sodium chloride   Intravenous STAT  . aspirin EC  81 mg Oral Daily  . cholecalciferol  1,000 Units Oral BID  . heparin  5,000 Units Subcutaneous Q8H  . insulin aspart  0-9 Units Subcutaneous TID WC  . irbesartan  300 mg Oral Daily  . isosorbide mononitrate  60 mg Oral Daily  .  levETIRAcetam  500 mg Intravenous Q12H  . metoprolol succinate  50 mg Oral BID  . naphazoline-pheniramine  1 drop Both Eyes Daily  . pantoprazole  40 mg Oral Daily  . simvastatin  20 mg Oral QHS  . sodium chloride  3 mL Intravenous Q12H    ASSESSMENT/PLAN:                                                                                                            break through seizure:  Tegretol level this AM returned 5.4.  Patient has remained seizure free over night and returned to baseline  Recommend: 1) Restart Tegretol at 300 mg PO BID and obtain a level in three days.  2) Continue Keppra at current dose 3) Continue seizure precautions while in hospital.     Assessment and plan discussed with with attending physician and they are in agreement.    Felicie Morn PA-C [facility] [phone]  05/29/2013, 9:15 AM

## 2013-05-29 NOTE — Progress Notes (Signed)
Utilization review completed. Dejana Pugsley, RN, BSN. 

## 2013-05-29 NOTE — Discharge Summary (Signed)
Physician Discharge Summary  NAME:[name] [name]  ZOX:[id number]  DOB: 04/26/1928   Admit date: 05/28/2013 Discharge date: 05/29/2013  Discharge Diagnoses:  Active Problems:   Coronary artery disease - symptomatically stable with negative cardiac enzymes   Diabetes mellitus - stable   Left bundle branch block   CVA (cerebral vascular accident) - no new CVA   Seizure disorder - patient with probable seizure and seizure medicines have been adjusted   CAD (coronary artery disease)   Multi-infarct dementia   Acute encephalopathy - may have been a postictal state versus Tegretol toxicity.  Tegretol has been reduced for discharge to 400 milligrams twice daily and level will be followed up in 48 hours.  Patient is a no CODE BLUE   Discharge Physical Exam:  General Appearance: Alert, cooperative, no distress, appears stated age  Weight change:   Intake/Output Summary (Last 24 hours) at 05/29/13 1616 Last data filed at 05/28/13 2300  Gross per 24 hour  Intake    625 ml  Output      0 ml  Net    625 ml   Filed Vitals:   05/28/13 2238 05/29/13 0144 05/29/13 0624 05/29/13 1122  BP: 129/53 134/48 129/46 154/56  Pulse: 63 60 60 60  Temp:  98 F (36.7 C) 97.8 F (36.6 C)   TempSrc:  Oral Oral   Resp:  18 19   SpO2:  100% 98%    General: A&O x 2, NAD, nontoxic, pleasant/cooperative  Head/Eye: No conjunctival hemorrhage, no icterus, [place]/AT, No nystagmus  ENT: No icterus, No thrush,no pharyngeal exudate  Neck: No masses, no lymphadenpathy, no bruits  CV: RRR, no rub, no gallop, no S3  Lung: CTAB, good air movement, no wheeze, no rhonchi  Abdomen: soft/NT, +BS, nondistended, no peritoneal signs  Ext: No cyanosis, No rashes, No petechiae, No lymphangitis, No edema  Neuro: CNII-XII intact, strength 4/5 in bilateral upper and lower extremities, no dysmetria   Discharge Condition: Improved  Hospital Course: 79 year old female with a history of diastolic CHF, CAD, status order, diabetes  mellitus, hypertension, sinus node dysfunction, and multi-infarct dementia presented with altered mental status. The patient was unable to provide any significant history due to her dementia. In addition, the patient was found to be somewhat encephalopathic in the emergency room. All of this history is obtained from speaking with ED physician and review of records. Apparently, the patient was noted to be normal around 9:30 in the morning by the nursing staff at [facility]. However, the patient later was found to be altered with slurred speech. Code stroke was activated and the patient was brought to the hospital. Neurology was consulted to see the patient due to code stroke. Code Stroke was canceled after being evaluated by Dr. Roseanne Reno. En route to hospital, patient was noted to be incontinent of both stool and urine. CT scan of the brain was negative for any acute abnormalities. The patient did have one episode of emesis during a CT. At the time of neurology evaluation, the patient was more awake and was able to verbalize. At the time of my evaluation, the patient was able to answer simple questions and was alert and oriented x 2. The patient denied any fevers, chills, headache, chest pain, shortness of breath, abdominal pain, dysuria. Neurology felt that the patient may have had a seizure and was postictal versus the possibility of her mental status do to Tegretol toxicity.  Workup in the emergency department revealed elevated Tegretol level of 16.9. BMP showed calcium  3.3, serum creatinine 1.10, ionized calcium 1.09. Urine drug screen was negative. Urinalysis did not have any significant pyuria. Magnesium was 1.4. Hepatic enzymes and lipase were unremarkable. After 24 hours of monitoring mental status continued to improve with no further seizure activity or increased confusion.  Patient eating well at time of discharge.  Will be discharged on the following diet:  Dysphagia 1 thin liquids and will have speech  therapy evaluate to progress diet as tolerated at [facility] skilled nursing facility   Things to follow up in the outpatient setting: Seizure activity and neurological status  Consults:   Hospital Neurology - Dr. Noel Christmas  Disposition: 03-Skilled Nursing Facility  Discharge Orders   Future Orders Complete By Expires     Call MD for:  difficulty breathing, headache or visual disturbances  As directed     Call MD for:  persistant nausea and vomiting  As directed     Call MD for:  temperature >100.4  As directed     Diet - low sodium heart healthy  As directed     Increase activity slowly  As directed         Medication List    STOP taking these medications       carbamazepine 300 MG 12 hr capsule  Commonly known as:  CARBATROL     carbamazepine 400 MG 12 hr tablet  Commonly known as:  TEGRETOL XR     mometasone 0.1 % ointment  Commonly known as:  ELOCON      TAKE these medications       ALPRAZolam 0.25 MG tablet  Commonly known as:  XANAX  Take 0.125 mg by mouth every 8 (eight) hours as needed for anxiety.     aspirin 81 MG EC tablet  Take 1 tablet (81 mg total) by mouth daily.     carbamazepine 200 MG tablet  Commonly known as:  TEGRETOL  Take 2 tablets (400 mg total) by mouth 2 (two) times daily.     cholecalciferol 1000 UNITS tablet  Commonly known as:  VITAMIN D  Take 1,000 Units by mouth 2 (two) times daily.     furosemide 40 MG tablet  Commonly known as:  LASIX  Take 1 tablet (40 mg total) by mouth daily.     glyBURIDE 2.5 MG tablet  Commonly known as:  DIABETA  Take 2.5 mg by mouth every morning.     insulin aspart 100 UNIT/ML injection  Commonly known as:  novoLOG  Inject 0-10 Units into the skin 3 (three) times daily with meals. Blood sugar under 150, give 0 units; 150-200 give 2 units; 201-250 give 4 units; 251-300 give 6 units, 301-350 give 8 units, and 351--400 give 10 units.  Above 400, call MD.     isosorbide mononitrate 30 MG 24 hr tablet   Commonly known as:  IMDUR  Take 60 mg by mouth daily.     LASTACAFT 0.25 % Soln  Generic drug:  Alcaftadine  Place 1 drop into both eyes daily.     metoprolol succinate 25 MG 24 hr tablet  Commonly known as:  TOPROL-XL  Take 50 mg by mouth 2 (two) times daily.     naphazoline-pheniramine 0.025-0.3 % ophthalmic solution  Commonly known as:  NAPHCON-A  Place 1 drop into both eyes daily. 1 drop in each eye daily as needed for eye itching     nitroGLYCERIN 0.4 MG SL tablet  Commonly known as:  NITROSTAT  Place 0.4 mg  under the tongue every 5 (five) minutes x 3 doses as needed. For chest pain     omeprazole 20 MG capsule  Commonly known as:  PRILOSEC  Take 20 mg by mouth daily before breakfast. Take on empty stomach at 630 am     simvastatin 20 MG tablet  Commonly known as:  ZOCOR  Take 20 mg by mouth at bedtime.     telmisartan 80 MG tablet  Commonly known as:  MICARDIS  Take 80 mg by mouth daily.         The results of significant diagnostics from this hospitalization (including imaging, microbiology, ancillary and laboratory) are listed below for reference.    Significant Diagnostic Studies: Ct Head Wo Contrast  05/28/2013   *RADIOLOGY REPORT*  Clinical Data: Decreased level of consciousness with speech changes.  Code stroke.  CT HEAD WITHOUT CONTRAST  Technique:  Contiguous axial images were obtained from the base of the skull through the vertex without contrast.  Comparison: Head CT 04/02/2013.  Findings: Old right parietal and left temporal occipital infarcts are stable.  There are stable chronic infarcts in the right lentiform nuclei.  No acute intracranial hemorrhage, mass lesion, brain edema or extra-axial fluid collection is seen.  There is no evidence of acute cortical based infarct.  Generalized atrophy is unchanged.  There are postsurgical changes status post right mastoid resection. Paranasal sinus postsurgical changes are also present.  There is diffuse calvarial  hyperostosis.  IMPRESSION:  1.  Stable chronic findings with old infarcts bilaterally as described. 2.  No CT evidence of acute infarction or hemorrhage.  Critical Value/emergent results were called by telephone at the time of interpretation on 05/28/2013 at 1127 hours to Dr. Roseanne Reno, who verbally acknowledged these results.   Original Report Authenticated By: Carey Bullocks, M.D.    Microbiology: Recent Results (from the past 240 hour(s))  MRSA PCR SCREENING     Status: None   Collection Time    05/28/13  3:10 PM      Result Value Range Status   MRSA by PCR NEGATIVE  NEGATIVE Final   Comment:            The GeneXpert MRSA Assay (FDA     approved for NASAL specimens     only), is one component of a     comprehensive MRSA colonization     surveillance program. It is not     intended to diagnose MRSA     infection nor to guide or     monitor treatment for     MRSA infections.     Labs: Results for orders placed during the hospital encounter of 05/28/13  MRSA PCR SCREENING      Result Value Range   MRSA by PCR NEGATIVE  NEGATIVE  ETHANOL      Result Value Range   Alcohol, Ethyl (B) <11  0 - 11 mg/dL  PROTIME-INR      Result Value Range   Prothrombin Time 13.1  11.6 - 15.2 seconds   INR 1.01  0.00 - 1.49  APTT      Result Value Range   aPTT 29  24 - 37 seconds  CBC      Result Value Range   WBC 6.3  4.0 - 10.5 K/uL   RBC 3.57 (*) 3.87 - 5.11 MIL/uL   Hemoglobin 11.5 (*) 12.0 - 15.0 g/dL   HCT 16.1 (*) 09.6 - 04.5 %   MCV 93.6  78.0 - 100.0 fL  MCH 32.2  26.0 - 34.0 pg   MCHC 34.4  30.0 - 36.0 g/dL   RDW 16.1  09.6 - 04.5 %   Platelets 236  150 - 400 K/uL  DIFFERENTIAL      Result Value Range   Neutrophils Relative % 60  43 - 77 %   Neutro Abs 3.8  1.7 - 7.7 K/uL   Lymphocytes Relative 30  12 - 46 %   Lymphs Abs 1.9  0.7 - 4.0 K/uL   Monocytes Relative 8  3 - 12 %   Monocytes Absolute 0.5  0.1 - 1.0 K/uL   Eosinophils Relative 1  0 - 5 %   Eosinophils Absolute 0.1   0.0 - 0.7 K/uL   Basophils Relative 0  0 - 1 %   Basophils Absolute 0.0  0.0 - 0.1 K/uL  COMPREHENSIVE METABOLIC PANEL      Result Value Range   Sodium 133 (*) 135 - 145 mEq/L   Potassium 3.2 (*) 3.5 - 5.1 mEq/L   Chloride 94 (*) 96 - 112 mEq/L   CO2 25  19 - 32 mEq/L   Glucose, Bld 262 (*) 70 - 99 mg/dL   BUN 24 (*) 6 - 23 mg/dL   Creatinine, Ser 4.09  0.50 - 1.10 mg/dL   Calcium 8.8  8.4 - 81.1 mg/dL   Total Protein 8.0  6.0 - 8.3 g/dL   Albumin 3.1 (*) 3.5 - 5.2 g/dL   AST 12  0 - 37 U/L   ALT 6  0 - 35 U/L   Alkaline Phosphatase 144 (*) 39 - 117 U/L   Total Bilirubin 0.4  0.3 - 1.2 mg/dL   GFR calc non Af Amer 50 (*) >90 mL/min   GFR calc Af Amer 58 (*) >90 mL/min  TROPONIN I      Result Value Range   Troponin I <0.30  <0.30 ng/mL  URINE RAPID DRUG SCREEN (HOSP PERFORMED)      Result Value Range   Opiates NONE DETECTED  NONE DETECTED   Cocaine NONE DETECTED  NONE DETECTED   Benzodiazepines NONE DETECTED  NONE DETECTED   Amphetamines NONE DETECTED  NONE DETECTED   Tetrahydrocannabinol NONE DETECTED  NONE DETECTED   Barbiturates NONE DETECTED  NONE DETECTED  URINALYSIS, ROUTINE W REFLEX MICROSCOPIC      Result Value Range   Color, Urine YELLOW  YELLOW   APPearance CLEAR  CLEAR   Specific Gravity, Urine 1.013  1.005 - 1.030   pH 6.0  5.0 - 8.0   Glucose, UA NEGATIVE  NEGATIVE mg/dL   Hgb urine dipstick NEGATIVE  NEGATIVE   Bilirubin Urine NEGATIVE  NEGATIVE   Ketones, ur NEGATIVE  NEGATIVE mg/dL   Protein, ur NEGATIVE  NEGATIVE mg/dL   Urobilinogen, UA 0.2  0.0 - 1.0 mg/dL   Nitrite NEGATIVE  NEGATIVE   Leukocytes, UA NEGATIVE  NEGATIVE  CARBAMAZEPINE LEVEL, TOTAL      Result Value Range   Carbamazepine Lvl 16.9 (*) 4.0 - 12.0 ug/mL  LIPASE, BLOOD      Result Value Range   Lipase 8 (*) 11 - 59 U/L  GLUCOSE, CAPILLARY      Result Value Range   Glucose-Capillary 262 (*) 70 - 99 mg/dL  MAGNESIUM      Result Value Range   Magnesium 1.4 (*) 1.5 - 2.5 mg/dL   PHOSPHORUS      Result Value Range   Phosphorus 3.4  2.3 - 4.6 mg/dL  TROPONIN I      Result Value Range   Troponin I <0.30  <0.30 ng/mL  TROPONIN I      Result Value Range   Troponin I <0.30  <0.30 ng/mL  CARBAMAZEPINE LEVEL, TOTAL      Result Value Range   Carbamazepine Lvl 5.4  4.0 - 12.0 ug/mL  BASIC METABOLIC PANEL      Result Value Range   Sodium 137  135 - 145 mEq/L   Potassium 3.4 (*) 3.5 - 5.1 mEq/L   Chloride 100  96 - 112 mEq/L   CO2 26  19 - 32 mEq/L   Glucose, Bld 97  70 - 99 mg/dL   BUN 20  6 - 23 mg/dL   Creatinine, Ser 4.09  0.50 - 1.10 mg/dL   Calcium 8.6  8.4 - 81.1 mg/dL   GFR calc non Af Amer 57 (*) >90 mL/min   GFR calc Af Amer 66 (*) >90 mL/min  GLUCOSE, CAPILLARY      Result Value Range   Glucose-Capillary 180 (*) 70 - 99 mg/dL  GLUCOSE, CAPILLARY      Result Value Range   Glucose-Capillary 68 (*) 70 - 99 mg/dL  GLUCOSE, CAPILLARY      Result Value Range   Glucose-Capillary 97  70 - 99 mg/dL  GLUCOSE, CAPILLARY      Result Value Range   Glucose-Capillary 104 (*) 70 - 99 mg/dL  GLUCOSE, CAPILLARY      Result Value Range   Glucose-Capillary 139 (*) 70 - 99 mg/dL  POCT I-STAT, CHEM 8      Result Value Range   Sodium 136  135 - 145 mEq/L   Potassium 3.3 (*) 3.5 - 5.1 mEq/L   Chloride 98  96 - 112 mEq/L   BUN 25 (*) 6 - 23 mg/dL   Creatinine, Ser 9.14  0.50 - 1.10 mg/dL   Glucose, Bld 782 (*) 70 - 99 mg/dL   Calcium, Ion 9.56 (*) 1.13 - 1.30 mmol/L   TCO2 25  0 - 100 mmol/L   Hemoglobin 12.2  12.0 - 15.0 g/dL   HCT 21.3  08.6 - 57.8 %  POCT I-STAT TROPONIN I      Result Value Range   Troponin i, poc 0.00  0.00 - 0.08 ng/mL   Comment 3             Time coordinating discharge: 40 minutes  Signed: Pearla Dubonnet, MD 05/29/2013, 4:16 PM

## 2013-05-29 NOTE — Progress Notes (Signed)
Discharge instructions given to patient; all questions answered.  Patient discharged home to [name].  Patient transported via [facility] to [facility].

## 2013-05-29 NOTE — Clinical Social Work Note (Signed)
Clinical Social Work Department BRIEF PSYCHOSOCIAL ASSESSMENT 05/29/2013  Patient:  [name] [name],[name] [name]     Account Number:  [id number]     Admit date:  05/28/2013  Clinical Social Worker:  Verl Blalock  Date/Time:  05/29/2013 04:30 PM  Referred by:  Physician  Date Referred:  05/29/2013 Referred for  SNF Placement   Other Referral:   Return to [facility] [place]   Interview type:  Family Other interview type:   Patient acknowledges understanding of plans    PSYCHOSOCIAL DATA Living Status:  FACILITY Admitted from facility:  [facility] [place] Level of care:  Skilled Nursing Facility Primary support name:  Reed,Debbie  [phone] Primary support relationship to patient:  CHILD, ADULT Degree of support available:   Strong    CURRENT CONCERNS Current Concerns  Post-Acute Placement   Other Concerns:    SOCIAL WORK ASSESSMENT / PLAN Clinical Social Worker spoke with patient daughter over the phone to offer support and discuss patient plans at discharge.  Patient daughter states that patient is from [facility] at [facility] [employer] and plans to return once medically ready.  CSW spoke with MD who states that patient is medically ready and can return.  CSW contacted facility who is agreeable with patient return and expecting her today.  Clinical Social Worker facilitated patient discharge including contacting patient family and facility to confirm patient discharge plans.  Clinical information faxed to facility and family agreeable with plan.  CSW arranged ambulance transport via [facility] to [facility] .  RN to call report prior to discharge.  Clinical Social Worker will sign off for now as social work intervention is no longer needed. Please consult [place] again if new need arises.    Assessment/plan status:  No Further Intervention Required Other assessment/ plan:   Information/referral to community resources:   No resources needed at this time.  DNR form  signed on chart copy    PATIENT'S/FAMILY'S RESPONSE TO PLAN OF CARE: Patient alert and oriented to self in the bed.  Patient daughter provides good support to patient over the phone. Patient seemed to acknowledge understanding of plan to return to [facility] tonight.  Patient family and facility agreeable and understanding of patient discharge plans.

## 2013-06-15 ENCOUNTER — Emergency Department (HOSPITAL_COMMUNITY): Payer: Medicare Other

## 2013-06-15 ENCOUNTER — Encounter (HOSPITAL_COMMUNITY): Payer: Self-pay | Admitting: *Deleted

## 2013-06-15 ENCOUNTER — Observation Stay (HOSPITAL_COMMUNITY)
Admission: EM | Admit: 2013-06-15 | Discharge: 2013-06-16 | Disposition: A | Discharge status: Skilled Nursing Facility | Payer: Medicare Other | Attending: Internal Medicine | Admitting: Internal Medicine

## 2013-06-15 DIAGNOSIS — E119 Type 2 diabetes mellitus without complications: Secondary | ICD-10-CM | POA: Insufficient documentation

## 2013-06-15 DIAGNOSIS — Z951 Presence of aortocoronary bypass graft: Secondary | ICD-10-CM | POA: Insufficient documentation

## 2013-06-15 DIAGNOSIS — F0150 Vascular dementia without behavioral disturbance: Secondary | ICD-10-CM | POA: Insufficient documentation

## 2013-06-15 DIAGNOSIS — I672 Cerebral atherosclerosis: Secondary | ICD-10-CM | POA: Insufficient documentation

## 2013-06-15 DIAGNOSIS — T4275XA Adverse effect of unspecified antiepileptic and sedative-hypnotic drugs, initial encounter: Secondary | ICD-10-CM | POA: Insufficient documentation

## 2013-06-15 DIAGNOSIS — I2510 Atherosclerotic heart disease of native coronary artery without angina pectoris: Secondary | ICD-10-CM | POA: Insufficient documentation

## 2013-06-15 DIAGNOSIS — M25551 Pain in right hip: Secondary | ICD-10-CM

## 2013-06-15 DIAGNOSIS — I10 Essential (primary) hypertension: Secondary | ICD-10-CM | POA: Insufficient documentation

## 2013-06-15 DIAGNOSIS — T426X1A Poisoning by other antiepileptic and sedative-hypnotic drugs, accidental (unintentional), initial encounter: Secondary | ICD-10-CM

## 2013-06-15 DIAGNOSIS — I959 Hypotension, unspecified: Secondary | ICD-10-CM | POA: Insufficient documentation

## 2013-06-15 DIAGNOSIS — N390 Urinary tract infection, site not specified: Secondary | ICD-10-CM | POA: Insufficient documentation

## 2013-06-15 DIAGNOSIS — Z79899 Other long term (current) drug therapy: Secondary | ICD-10-CM | POA: Insufficient documentation

## 2013-06-15 DIAGNOSIS — T50995A Adverse effect of other drugs, medicaments and biological substances, initial encounter: Secondary | ICD-10-CM | POA: Insufficient documentation

## 2013-06-15 DIAGNOSIS — Z7982 Long term (current) use of aspirin: Secondary | ICD-10-CM | POA: Insufficient documentation

## 2013-06-15 DIAGNOSIS — R9431 Abnormal electrocardiogram [ECG] [EKG]: Secondary | ICD-10-CM | POA: Insufficient documentation

## 2013-06-15 DIAGNOSIS — G40909 Epilepsy, unspecified, not intractable, without status epilepticus: Secondary | ICD-10-CM | POA: Insufficient documentation

## 2013-06-15 DIAGNOSIS — R29898 Other symptoms and signs involving the musculoskeletal system: Secondary | ICD-10-CM | POA: Insufficient documentation

## 2013-06-15 DIAGNOSIS — E860 Dehydration: Secondary | ICD-10-CM

## 2013-06-15 DIAGNOSIS — R4182 Altered mental status, unspecified: Principal | ICD-10-CM | POA: Insufficient documentation

## 2013-06-15 DIAGNOSIS — N179 Acute kidney failure, unspecified: Secondary | ICD-10-CM | POA: Insufficient documentation

## 2013-06-15 DIAGNOSIS — G9340 Encephalopathy, unspecified: Secondary | ICD-10-CM

## 2013-06-15 LAB — POCT I-STAT, CHEM 8
Calcium, Ion: 1.04 mmol/L — ABNORMAL LOW (ref 1.13–1.30)
Creatinine, Ser: 1.2 mg/dL — ABNORMAL HIGH (ref 0.50–1.10)
Hemoglobin: 11.6 g/dL — ABNORMAL LOW (ref 12.0–15.0)
Sodium: 137 mEq/L (ref 135–145)
TCO2: 24 mmol/L (ref 0–100)

## 2013-06-15 LAB — URINE MICROSCOPIC-ADD ON

## 2013-06-15 LAB — CBC
HCT: 28.8 % — ABNORMAL LOW (ref 36.0–46.0)
MCH: 32.4 pg (ref 26.0–34.0)
MCHC: 33.7 g/dL (ref 30.0–36.0)
MCV: 96.1 fL (ref 78.0–100.0)
Platelets: 244 10*3/uL (ref 150–400)
Platelets: 212 10*3/uL (ref 150–400)
RDW: 13.6 % (ref 11.5–15.5)
RDW: 13.5 % (ref 11.5–15.5)
WBC: 7.5 10*3/uL (ref 4.0–10.5)

## 2013-06-15 LAB — URINALYSIS, ROUTINE W REFLEX MICROSCOPIC
Glucose, UA: NEGATIVE mg/dL
Nitrite: NEGATIVE
pH: 6 (ref 5.0–8.0)

## 2013-06-15 LAB — COMPREHENSIVE METABOLIC PANEL
ALT: 5 U/L (ref 0–35)
AST: 14 U/L (ref 0–37)
Albumin: 3.3 g/dL — ABNORMAL LOW (ref 3.5–5.2)
Calcium: 9.4 mg/dL (ref 8.4–10.5)
Sodium: 134 mEq/L — ABNORMAL LOW (ref 135–145)
Total Protein: 7.8 g/dL (ref 6.0–8.3)

## 2013-06-15 LAB — PROTIME-INR: Prothrombin Time: 12.6 seconds (ref 11.6–15.2)

## 2013-06-15 LAB — DIFFERENTIAL
Basophils Absolute: 0 10*3/uL (ref 0.0–0.1)
Eosinophils Absolute: 0.2 10*3/uL (ref 0.0–0.7)
Eosinophils Relative: 2 % (ref 0–5)
Lymphocytes Relative: 48 % — ABNORMAL HIGH (ref 12–46)

## 2013-06-15 LAB — GLUCOSE, CAPILLARY
Glucose-Capillary: 167 mg/dL — ABNORMAL HIGH (ref 70–99)
Glucose-Capillary: 133 mg/dL — ABNORMAL HIGH (ref 70–99)

## 2013-06-15 LAB — RAPID URINE DRUG SCREEN, HOSP PERFORMED
Amphetamines: NOT DETECTED
Benzodiazepines: NOT DETECTED
Cocaine: NOT DETECTED

## 2013-06-15 LAB — CK: Total CK: 83 U/L (ref 7–177)

## 2013-06-15 LAB — ETHANOL: Alcohol, Ethyl (B): <11 mg/dL (ref 0–11)

## 2013-06-15 LAB — CREATININE, SERUM: GFR calc non Af Amer: 57 mL/min — ABNORMAL LOW (ref >90)

## 2013-06-15 MED ORDER — ENOXAPARIN SODIUM 40 MG/0.4ML ~~LOC~~ SOLN
40.0000 mg | SUBCUTANEOUS | Status: DC
  Administered 2013-06-15: 40 mg via SUBCUTANEOUS
  Filled 2013-06-15 (×2): qty 0.4

## 2013-06-15 MED ORDER — ALCAFTADINE 0.25 % OP SOLN: 1.0000 [drp] | Freq: Every day | OPHTHALMIC | Status: DC

## 2013-06-15 MED ORDER — SODIUM CHLORIDE 0.9 % IV SOLN
500.0000 mg | Freq: Two times a day (BID) | INTRAVENOUS | Status: DC
  Administered 2013-06-15 – 2013-06-16 (×2): 500 mg via INTRAVENOUS
  Filled 2013-06-15 (×4): qty 5

## 2013-06-15 MED ORDER — SODIUM CHLORIDE 0.9 % IV SOLN
INTRAVENOUS | Status: DC
  Administered 2013-06-15: 17:00:00 via INTRAVENOUS

## 2013-06-15 MED ORDER — DEXTROSE 5 % IV SOLN
1.0000 g | INTRAVENOUS | Status: DC
  Administered 2013-06-15: 1 g via INTRAVENOUS
  Filled 2013-06-15 (×2): qty 10

## 2013-06-15 MED ORDER — SIMVASTATIN 20 MG PO TABS
20.0000 mg | ORAL_TABLET | Freq: Every day | ORAL | Status: DC
  Filled 2013-06-15 (×2): qty 1

## 2013-06-15 MED ORDER — ISOSORBIDE MONONITRATE ER 60 MG PO TB24
60.0000 mg | ORAL_TABLET | Freq: Every day | ORAL | Status: DC
  Administered 2013-06-16: 60 mg via ORAL
  Filled 2013-06-15: qty 1

## 2013-06-15 MED ORDER — SODIUM CHLORIDE 0.9 % IV SOLN: INTRAVENOUS | Status: DC

## 2013-06-15 MED ORDER — SODIUM CHLORIDE 0.9 % IJ SOLN
3.0000 mL | Freq: Two times a day (BID) | INTRAMUSCULAR | Status: DC
  Administered 2013-06-16: 3 mL via INTRAVENOUS

## 2013-06-15 MED ORDER — ASPIRIN EC 81 MG PO TBEC
81.0000 mg | DELAYED_RELEASE_TABLET | Freq: Every day | ORAL | Status: DC
  Administered 2013-06-16: 81 mg via ORAL
  Filled 2013-06-15: qty 1

## 2013-06-15 MED ORDER — SODIUM CHLORIDE 0.9 % IV SOLN
Freq: Once | INTRAVENOUS | Status: AC
  Administered 2013-06-15: 16:00:00 via INTRAVENOUS

## 2013-06-15 MED ORDER — OLOPATADINE HCL 0.1 % OP SOLN
1.0000 [drp] | Freq: Two times a day (BID) | OPHTHALMIC | Status: DC
  Administered 2013-06-15 – 2013-06-16 (×2): 1 [drp] via OPHTHALMIC
  Filled 2013-06-15: qty 5

## 2013-06-15 MED ORDER — ALPRAZOLAM 0.25 MG PO TABS: 0.1250 mg | ORAL_TABLET | Freq: Three times a day (TID) | ORAL | Status: DC | PRN

## 2013-06-15 MED ORDER — PANTOPRAZOLE SODIUM 40 MG PO TBEC
40.0000 mg | DELAYED_RELEASE_TABLET | Freq: Every day | ORAL | Status: DC
  Administered 2013-06-16: 40 mg via ORAL
  Filled 2013-06-15: qty 1

## 2013-06-15 MED ORDER — INSULIN ASPART 100 UNIT/ML ~~LOC~~ SOLN: 0.0000 [IU] | Freq: Three times a day (TID) | SUBCUTANEOUS | Status: DC

## 2013-06-15 MED ORDER — NAPHAZOLINE-PHENIRAMINE 0.025-0.3 % OP SOLN
1.0000 [drp] | Freq: Every day | OPHTHALMIC | Status: DC
  Administered 2013-06-16: 1 [drp] via OPHTHALMIC
  Filled 2013-06-15: qty 15

## 2013-06-15 MED ORDER — LORAZEPAM 2 MG/ML IJ SOLN
2.0000 mg | INTRAMUSCULAR | Status: DC | PRN
  Filled 2013-06-15: qty 1

## 2013-06-15 MED ORDER — METOPROLOL SUCCINATE ER 50 MG PO TB24
50.0000 mg | ORAL_TABLET | Freq: Two times a day (BID) | ORAL | Status: DC
  Administered 2013-06-16: 50 mg via ORAL
  Filled 2013-06-15 (×2): qty 1

## 2013-06-15 NOTE — H&P (Addendum)
[facility] History and Physical  [name] AVW:[id number] DOB: December 29, 1927 DOA: 06/15/2013  Referring physician: Dr Ignacia Palma PCP: Pearla Dubonnet, MD  Specialists: none  Chief Complaint: brought in from snf   HPI: [name] [name] is a 79 y.o. female with h/o seizures, CAD, s/p CABG, hypertension brought in from SNF for altered mental status, left sided weakness, unable to speak around 12 this afternoon. She was initially brought as code stroke. Her initial CT was negative for acute cva. Her tegretal level was found to be elevated. Her left sided weakness improved. And she was able to follow simple commands and speak a little. But she remained confused. No family at bedside , hisotry was limited and most of the history was obtained from EDP notes. She denies any new compalints. Neurology consulted , recommended stopping the tegretol and medicl admission for seizures.   Review of Systems: could not be obtained.  Past Medical History  Diagnosis Date  . Hypertension   . Acid reflux   . Seizures   . Coronary artery disease 2003    60-75%D2, 50% D1, 25% left circ, 20% LAD, normal LVF  . Dyslipidemia   . LBBB (left bundle branch block)   . Confusion   . Pacemaker   . Fall at nursing home 03/29/2013  . Type II diabetes mellitus   . Myocardial infarction     "long time ago" (03/31/2013)  . Exertional shortness of breath     "once in awhile" (03/31/2013)  . Anxiety   . Multi-infarct dementia   . Acute pulmonary edema   . Stroke     dennies residual 03/31/2013   Past Surgical History  Procedure Laterality Date  . Cholecystectomy    . Vaginal hysterectomy    . Appendectomy    . Iridectomy    . Cardiac catheterization    . Eye surgery      cataracts/ iridectomy  . Tonsillectomy and adenoidectomy    . Mastoidectomy Right   . Cataract extraction w/ intraocular lens  implant, bilateral    . Dilation and curettage of uterus    . Tubal ligation    . Insert / replace / remove  pacemaker  06/20/2012   Social History:  reports that she has never smoked. She has never used smokeless tobacco. She reports that she does not drink alcohol or use illicit drugs.  where does patient live--home,  No Known Allergies  History reviewed. No pertinent family history.   Prior to Admission medications   Medication Sig Start Date End Date Taking? Authorizing Provider  Alcaftadine (LASTACAFT) 0.25 % SOLN Place 1 drop into both eyes daily.    Yes Historical Provider, MD  ALPRAZolam (XANAX) 0.25 MG tablet Take 0.125 mg by mouth every 8 (eight) hours as needed for anxiety.   Yes Historical Provider, MD  aspirin EC 81 MG EC tablet Take 1 tablet (81 mg total) by mouth daily. 04/21/13  Yes Marden Noble, MD  carbamazepine (TEGRETOL) 200 MG tablet Take 2 tablets (400 mg total) by mouth 2 (two) times daily. 05/29/13  Yes Marden Noble, MD  cholecalciferol (VITAMIN D) 1000 UNITS tablet Take 1,000 Units by mouth 2 (two) times daily.   Yes Historical Provider, MD  furosemide (LASIX) 40 MG tablet Take 1 tablet (40 mg total) by mouth daily. 04/21/13  Yes Marden Noble, MD  glyBURIDE (DIABETA) 2.5 MG tablet Take 2.5 mg by mouth every morning.    Yes Historical Provider, MD  insulin aspart (NOVOLOG) 100 UNIT/ML injection Inject  0-10 Units into the skin 3 (three) times daily with meals. Blood sugar under 150, give 0 units; 150-200 give 2 units; 201-250 give 4 units; 251-300 give 6 units, 301-350 give 8 units, and 351--400 give 10 units.  Above 400, call MD.   Yes Historical Provider, MD  isosorbide mononitrate (IMDUR) 60 MG 24 hr tablet Take 60 mg by mouth daily.   Yes Historical Provider, MD  metoprolol succinate (TOPROL-XL) 25 MG 24 hr tablet Take 50 mg by mouth 2 (two) times daily.    Yes Historical Provider, MD  naphazoline-pheniramine (NAPHCON-A) 0.025-0.3 % ophthalmic solution Place 1 drop into both eyes daily. 1 drop in each eye daily as needed for eye itching 05/29/13  Yes Marden Noble, MD  omeprazole  (PRILOSEC) 20 MG capsule Take 20 mg by mouth daily before breakfast. Take on empty stomach at 630 am   Yes Historical Provider, MD  simvastatin (ZOCOR) 20 MG tablet Take 20 mg by mouth at bedtime.   Yes Historical Provider, MD  telmisartan (MICARDIS) 80 MG tablet Take 80 mg by mouth daily.   Yes Historical Provider, MD  nitroGLYCERIN (NITROSTAT) 0.4 MG SL tablet Place 0.4 mg under the tongue every 5 (five) minutes x 3 doses as needed. For chest pain    Historical Provider, MD   Physical Exam: Filed Vitals:   06/15/13 1600 06/15/13 1630 06/15/13 1651 06/15/13 1700  BP: 86/37 179/62 154/79 180/79  Pulse: 59 59 62 59  Temp:      TempSrc:      Resp: 11 14 16 15   Weight:      SpO2: 96% 98% 96% 97%    Constitutional: Vital signs reviewed.  Patient is a well-developed and well-nourished  in no acute distress and cooperative with exam. Alert and oriented x0 able to follow simple commands. Head: Normocephalic and atraumatic Ear: TM normal bilaterally Eyes: PERRL, EOMI, conjunctivae normal, No scleral icterus.  Neck: Supple, Trachea midline normal ROM, No JVD, mass, thyromegaly, or carotid bruit present.  Cardiovascular: RRR, S1 normal, S2 normal, no MRG, pulses symmetric and intact bilaterally Pulmonary/Chest: normal respiratory effort, CTAB, no wheezes, rales, or rhonchi Abdominal: Soft. Non-tender, non-distended, bowel sounds are normal, no masses, organomegaly, or guarding present.  Musculoskeletal: No joint deformities, erythema, or stiffness, ROM full and no nontender Neurological: A&O x0, able to move all extremities. Could not stand or ambulate her yet.  Skin: Warm, dry and intact. No rash, cyanosis, or clubbing.       Labs on Admission:  Basic Metabolic Panel:  Recent Labs Lab 06/15/13 1446 06/15/13 1456  NA 134* 137  K 4.0 4.0  CL 95* 102  CO2 23  --   GLUCOSE 189* 196*  BUN 31* 31*  CREATININE 1.03 1.20*  CALCIUM 9.4  --    Liver Function Tests:  Recent Labs Lab  06/15/13 1446  AST 14  ALT 5  ALKPHOS 127*  BILITOT 0.4  PROT 7.8  ALBUMIN 3.3*   No results found for this basename: LIPASE, AMYLASE,  in the last 168 hours No results found for this basename: AMMONIA,  in the last 168 hours CBC:  Recent Labs Lab 06/15/13 1446 06/15/13 1456  WBC 7.5  --   NEUTROABS 3.2  --   HGB 10.9* 11.6*  HCT 32.3* 34.0*  MCV 96.1  --   PLT 244  --    Cardiac Enzymes:  Recent Labs Lab 06/15/13 1446  TROPONINI <0.30    BNP (last 3 results)  Recent  Labs  04/18/13 0511 04/20/13 0430  PROBNP 6172.0* 3588.0*   CBG:  Recent Labs Lab 06/15/13 1456  GLUCAP 192*    Radiological Exams on Admission: Ct Head Wo Contrast  06/15/2013   *RADIOLOGY REPORT*  Clinical Data: Code stroke  CT HEAD WITHOUT CONTRAST  Technique:  Contiguous axial images were obtained from the base of the skull through the vertex without contrast.  Comparison: CT 05/28/2013  Findings: Moderate atrophy.  Chronic right parietal infarct. Chronic left occipital infarct.  Chronic infarcts in the cerebellum bilaterally.  These are unchanged.  There is also chronic ischemia in the white matter and right basal ganglia.  Negative for acute infarct.  Negative for hemorrhage or mass lesion.  Air-fluid level and mucosal thickening in the right maxillary sinus.  Chronic bony thickening of the left maxillary sinus.  No acute skull abnormality.  IMPRESSION: Atrophy and extensive chronic ischemia.  No superimposed acute infarct or hemorrhage.  Sinusitis.  Critical Value/emergent results were called by telephone at the time of interpretation on 06/15/2013 at 1440 hours to Dr. Amada Jupiter, who verbally acknowledged these results.   Original Report Authenticated By: Janeece Riggers, M.D.    EKG: sinus with LBBB, with t wave inversions in lead I, avl, and in v4 to v6.   Assessment/Plan Active Problems:  1. Seizure activity/ tegretol toxicity:  - admit to telemetry.  - put pt on seizure precautions. -  appreciate neurology recommendations.  - if her symptoms dont resolve by am, please repeat CT head .  - stopped tegretol repeat tegretol level in 48 hours.  - iv keppra ordered .    2. Abnormal EKG: with negative troponin. She denies any chest pain or sob. Will cycle enzymes and repeat EKG in am. Possibly related to hypotension on admission.   3. Hypotension and hypothermic on admission: possibly from seizure. Hypotension resolved with fluid bolus .   4. Acute renal failure: dehydration. Will get CK levels.   5. Hypocalcemia: replete as needed.   6. UTI: started on rocephin.   7. Diabetes Mellitus: SSI.   DVT prophylaxis.    Code Status: DNR Family Communication: None at bedside Disposition Plan: pending.   Time spent: 65 min  [facility] Pager [phone]  If 7PM-7AM, please contact night-coverage [email or web address] Password [facility] 06/15/2013, 6:32 PM

## 2013-06-15 NOTE — ED Notes (Signed)
Pt arrived by [facility] from [facility]. Last seen normal by staff after lunch around 1215 or 1230. Per ems, cbg 175 and pt had left side weakness.

## 2013-06-15 NOTE — ED Notes (Signed)
CBG was 167.

## 2013-06-15 NOTE — Code Documentation (Signed)
Code Stroke called at 1354 Patient arrival 52 Last Known well 1230 EDP exam/cleared for CT 1423 Stroke team arrival 1415 Neurologist arrival 1420 Patient arrival 2 Phlebotomist arrival 1440  Patient witnessed eating lunch about 1230, patient normal.  About 1pm patient found slumped over.  NIHSS 9, patient drowsy, unable to answer questions, drift in bilat legs, mild dysarthria and aphasia.  Patient neurological symptoms improving.  Patient also Cool, clammy and nauseated.  Code Stroke canceled at 1500.

## 2013-06-15 NOTE — Consult Note (Addendum)
Neurology Consultation Reason for Consult: AMS c left weakness Referring Physician: Denton Lank  CC: Altered mental status  History is obtained from: EMS, nursing home staff  HPI: [name] [name] is a 79 y.o. female who was eating lunch sometime between 12:15 12:30 when her tach came in to find her slumped over. When trying to arouse her, she was confused and demonstrated left-sided weakness and therefore she was taken to the emergency room for possible stroke. In route, her left-sided weakness gradually improved but she remained confused. Therefore a code stroke was called   LKW: 12:15 p.m. tpa given: no, rapidly improving symptoms, unclear that this represents stroke    ROS: Unable to assess secondary to patient's altered mental status.    Past Medical History  Diagnosis Date  . Hypertension   . Acid reflux   . Seizures   . Coronary artery disease 2003    60-75%D2, 50% D1, 25% left circ, 20% LAD, normal LVF  . Dyslipidemia   . LBBB (left bundle branch block)   . Confusion   . Pacemaker   . Fall at nursing home 03/29/2013  . Type II diabetes mellitus   . Myocardial infarction     "long time ago" (03/31/2013)  . Exertional shortness of breath     "once in awhile" (03/31/2013)  . Anxiety   . Multi-infarct dementia   . Acute pulmonary edema   . Stroke     dennies residual 03/31/2013    Family History: Unable to assess secondary to patient's altered mental status.    Social History: Tob: Unable to assess secondary to patient's altered mental status.    Exam: Current vital signs: BP 86/37  Pulse 59  Temp(Src) 96.9 F (36.1 C) (Rectal)  Resp 11  Wt 78.4 kg (172 lb 13.5 oz)  BMI 29.65 kg/m2  SpO2 96% Vital signs in last 24 hours: Temp:  [96.9 F (36.1 C)] 96.9 F (36.1 C) (07/13 1446) Pulse Rate:  [59-71] 59 (07/13 1600) Resp:  [11-27] 11 (07/13 1600) BP: (86-168)/(37-102) 86/37 mmHg (07/13 1600) SpO2:  [91 %-99 %] 96 % (07/13 1600) Weight:  [78.4 kg (172 lb 13.5 oz)]  78.4 kg (172 lb 13.5 oz) (07/13 1300)  General: In bed, appears sleepy CV: Regular rate and rhythm Mental Status: Patient awakens to noxious stimuli, she is very dysarthric but does answer some questions. She states her name, and answers I don't know to orientation questions. She then states "I'm going to be sick" before having emesis Cranial Nerves: II: Visual Fields are difficult to assess, patient endorses seeing movement but is not reliable, no reliable blink to threat from either side, but patient does fixate and track. Pupils are equal, round, and reactive to light.  Discs are difficult to visualize. III,IV, VI: She does not follow commands to fixate, but is seem to look to the sides at times. Her extraocular movements are intact by oculocephalic maneuver V: Facial sensation is symmetric to touch VII: Facial movement is symmetric.  VIII: hearing is intact to voice X, XI, XII: Unable to assess secondary to patient's altered mental status.  Motor: Tone is normal. Bulk is normal. He does not formally cooperate with testing, but seems to have good strength in all extremities, and is symmetric. She does allow her legs to drift but has no drift in her upper extremities Sensory: Sensation is symmetric to light touch and temperature in the arms and legs. Deep Tendon Reflexes: 2+ and symmetric in the biceps and patellae.  Cerebellar:  No clear ataxia when reaching for objects Gait: Not assessed due to acute nature of evaluation and multiple medical monitors in ED setting.  I have reviewed labs in epic and the results pertinent to this consultation are: CBC-mild anemia CMP-mild hyponatremia, elevated alkaline phosphatase  I have reviewed the images obtained: CT head-no acute changes  Impression: 79 year old female with sudden onset persistent altered mental status and left-sided weakness that rapidly resolved. I suspect that she has had a seizure, and that her left-sided weakness was at  times paralysis. It is possible that she has had a stroke, but given the relatively nonfocal exam on arrival and history of similar presentation last week, my suspicion is that this does represent seizure I felt that the risk of t-PA in this setting outweighed the expected benefits.  Recommendations: 1) Tegretol level 2) would continue Tegretol dose at home dose (400 mg twice a day) 3) I would start Keppra 500 mg twice a day with a plan to continue this 4) if patient does not completely clear, would repeat CT tomorrow to assess for teacher   Ritta Slot, MD [facility] [phone]  If 7pm- 7am, please page neurology on call at [phone].    Tegretol level 19, would hold tegretol level at this time.  Continue keppra.   Tegretol toxicity could certainly be related to her presentation.   Ritta Slot, MD [facility] [phone]  If 7pm- 7am, please page neurology on call at [phone].

## 2013-06-15 NOTE — ED Notes (Signed)
Pt's CBG was 192 when I checked it.2:58 pm JG.

## 2013-06-15 NOTE — ED Provider Notes (Signed)
History    CSN: [id number] Arrival date & time 06/15/13  1423  None    Chief Complaint  Patient presents with  . Code Stroke   (Consider location/radiation/quality/duration/timing/severity/associated sxs/prior Treatment) HPI Comments: Patient is an 79 year old woman who lives at [facility].  She has a history of multi-infarct dementia and seizure disorder.. Today she was observed in her normal state around 12:15 PM. She was brought to [facility] with left-sided weakness as a code stroke. Per EMS, when they found her she had difficulty with speech and left-sided weakness. Left-sided weakness apparently he is during the trip to the hospital. She was seen by Burnadette Peter M.D. neurologist, who did not feel that she had had a stroke, but may of had a seizure. He canceled her code stroke. In the ED she's had one episode of nausea and vomiting.  Patient is a 79 y.o. female presenting with Acute Neurological Problem. The history is provided by the EMS personnel and medical records. No language interpreter was used.  Cerebrovascular Accident This is a recurrent problem. The current episode started 1 to 2 hours ago. The problem occurs constantly. The problem has been gradually improving. Pertinent negatives include no chest pain, no abdominal pain, no headaches and no shortness of breath. Nothing aggravates the symptoms. Nothing relieves the symptoms. Treatments tried: Patient was brought to [facility] via EMS.   Past Medical History  Diagnosis Date  . Hypertension   . Acid reflux   . Seizures   . Coronary artery disease 2003    60-75%D2, 50% D1, 25% left circ, 20% LAD, normal LVF  . Dyslipidemia   . LBBB (left bundle branch block)   . Confusion   . Pacemaker   . Fall at nursing home 03/29/2013  . Type II diabetes mellitus   . Myocardial infarction     "long time ago" (03/31/2013)  . Exertional shortness of breath     "once in awhile" (03/31/2013)  . Anxiety   .  Multi-infarct dementia   . Acute pulmonary edema   . Stroke     dennies residual 03/31/2013   Past Surgical History  Procedure Laterality Date  . Cholecystectomy    . Vaginal hysterectomy    . Appendectomy    . Iridectomy    . Cardiac catheterization    . Eye surgery      cataracts/ iridectomy  . Tonsillectomy and adenoidectomy    . Mastoidectomy Right   . Cataract extraction w/ intraocular lens  implant, bilateral    . Dilation and curettage of uterus    . Tubal ligation    . Insert / replace / remove pacemaker  06/20/2012   History reviewed. No pertinent family history. History  Substance Use Topics  . Smoking status: Never Smoker   . Smokeless tobacco: Never Used  . Alcohol Use: No   OB History   Grav Para Term Preterm Abortions TAB SAB Ect Mult Living                 Review of Systems  Unable to perform ROS: Dementia  Respiratory: Negative for shortness of breath.   Cardiovascular: Negative for chest pain.  Gastrointestinal: Negative for abdominal pain.  Neurological: Negative for headaches.    Allergies  Review of patient's allergies indicates no known allergies.  Home Medications   Current Outpatient Rx  Name  Route  Sig  Dispense  Refill  . Alcaftadine (LASTACAFT) 0.25 % SOLN   Both Eyes  Place 1 drop into both eyes daily.          Marland Kitchen ALPRAZolam (XANAX) 0.25 MG tablet   Oral   Take 0.125 mg by mouth every 8 (eight) hours as needed for anxiety.         Marland Kitchen aspirin EC 81 MG EC tablet   Oral   Take 1 tablet (81 mg total) by mouth daily.   30 tablet   0   . carbamazepine (TEGRETOL) 200 MG tablet   Oral   Take 2 tablets (400 mg total) by mouth 2 (two) times daily.   60 tablet   5   . cholecalciferol (VITAMIN D) 1000 UNITS tablet   Oral   Take 1,000 Units by mouth 2 (two) times daily.         . furosemide (LASIX) 40 MG tablet   Oral   Take 1 tablet (40 mg total) by mouth daily.   30 tablet   1   . glyBURIDE (DIABETA) 2.5 MG tablet    Oral   Take 2.5 mg by mouth every morning.          . insulin aspart (NOVOLOG) 100 UNIT/ML injection   Subcutaneous   Inject 0-10 Units into the skin 3 (three) times daily with meals. Blood sugar under 150, give 0 units; 150-200 give 2 units; 201-250 give 4 units; 251-300 give 6 units, 301-350 give 8 units, and 351--400 give 10 units.  Above 400, call MD.         . isosorbide mononitrate (IMDUR) 60 MG 24 hr tablet   Oral   Take 60 mg by mouth daily.         . metoprolol succinate (TOPROL-XL) 25 MG 24 hr tablet   Oral   Take 50 mg by mouth 2 (two) times daily.          . naphazoline-pheniramine (NAPHCON-A) 0.025-0.3 % ophthalmic solution   Both Eyes   Place 1 drop into both eyes daily. 1 drop in each eye daily as needed for eye itching   15 mL   0   . omeprazole (PRILOSEC) 20 MG capsule   Oral   Take 20 mg by mouth daily before breakfast. Take on empty stomach at 630 am         . simvastatin (ZOCOR) 20 MG tablet   Oral   Take 20 mg by mouth at bedtime.         Marland Kitchen telmisartan (MICARDIS) 80 MG tablet   Oral   Take 80 mg by mouth daily.         . nitroGLYCERIN (NITROSTAT) 0.4 MG SL tablet   Sublingual   Place 0.4 mg under the tongue every 5 (five) minutes x 3 doses as needed. For chest pain          BP 136/92  Pulse 71  Temp(Src) 96.9 F (36.1 C) (Rectal)  Resp 16  Wt 172 lb 13.5 oz (78.4 kg)  BMI 29.65 kg/m2  SpO2 91% Physical Exam  Nursing note and vitals reviewed. Constitutional:  Pale, ill-appearing elderly woman. She awakens to voice, but is not able answer any historical questions.  HENT:  Head: Normocephalic and atraumatic.  Right Ear: External ear normal.  Left Ear: External ear normal.  Mouth/Throat: Oropharynx is clear and moist.  No facial asymmetry.  Eyes: Conjunctivae are normal. Pupils are equal, round, and reactive to light.  Unable to test eye motility.  Neck: Normal range of motion. Neck supple.  No  carotid bruit.   Cardiovascular: Normal rate, regular rhythm and normal heart sounds.   Pulmonary/Chest: Effort normal and breath sounds normal.  Abdominal: Soft. Bowel sounds are normal.  Musculoskeletal:  No bony deformity or tenderness in her extremities.  Neurological:  Patient is awake, but does not speak. She moves her arms and legs in a limited way, but not to command.  Skin: Skin is warm and dry.  Psychiatric:  Unable to assess.    ED Course  Procedures (including critical care time)  3:18 PM  Date: 06/15/2013  Rate: 66  Rhythm: normal sinus rhythm  QRS Axis: left  Intervals: PR prolonged  ST/T Wave abnormalities: normal  Conduction Disutrbances:first-degree A-V block  and left bundle branch block  Narrative Interpretation: Abnormal EKG  Old EKG Reviewed: unchanged   Ct Head Wo Contrast  06/15/2013   *RADIOLOGY REPORT*  Clinical Data: Code stroke  CT HEAD WITHOUT CONTRAST  Technique:  Contiguous axial images were obtained from the base of the skull through the vertex without contrast.  Comparison: CT 05/28/2013  Findings: Moderate atrophy.  Chronic right parietal infarct. Chronic left occipital infarct.  Chronic infarcts in the cerebellum bilaterally.  These are unchanged.  There is also chronic ischemia in the white matter and right basal ganglia.  Negative for acute infarct.  Negative for hemorrhage or mass lesion.  Air-fluid level and mucosal thickening in the right maxillary sinus.  Chronic bony thickening of the left maxillary sinus.  No acute skull abnormality.  IMPRESSION: Atrophy and extensive chronic ischemia.  No superimposed acute infarct or hemorrhage.  Sinusitis.  Critical Value/emergent results were called by telephone at the time of interpretation on 06/15/2013 at 1440 hours to Dr. Amada Jupiter, who verbally acknowledged these results.   Original Report Authenticated By: Janeece Riggers, M.D.    3:27 PM Pt was seen, physical exam was performed.  Old charts were reviewed.  She had  been admitted with similar symptoms on 6//25/2014.  At that time she was felt to have had a seizure, and her seizure medications were adjusted.  She is a No Code Blue per form that came from [facility] with EMS.  Lab workup was ordered.  IV fluids, Oxygen ordered.  Tegretol level was added to existing orders,  CT of head showed extensive chronic ischemic changes and atrophy.  No acute stroke or hemorrhage was seen.    4:10 PM Now has BP about 80 systolic.  IV fluid bolus of 500 cc normal saline ordered.  4:32 PM Pt incontinent of a large amount of urine.  Foley catheter ordered.  5:20 PM Results for orders placed during the hospital encounter of 06/15/13  ETHANOL      Result Value Range   Alcohol, Ethyl (B) <11  0 - 11 mg/dL  PROTIME-INR      Result Value Range   Prothrombin Time 12.6  11.6 - 15.2 seconds   INR 0.96  0.00 - 1.49  APTT      Result Value Range   aPTT 25  24 - 37 seconds  CBC      Result Value Range   WBC 7.5  4.0 - 10.5 K/uL   RBC 3.36 (*) 3.87 - 5.11 MIL/uL   Hemoglobin 10.9 (*) 12.0 - 15.0 g/dL   HCT 16.1 (*) 09.6 - 04.5 %   MCV 96.1  78.0 - 100.0 fL   MCH 32.4  26.0 - 34.0 pg   MCHC 33.7  30.0 - 36.0 g/dL   RDW  13.6  11.5 - 15.5 %   Platelets 244  150 - 400 K/uL  DIFFERENTIAL      Result Value Range   Neutrophils Relative % 43  43 - 77 %   Neutro Abs 3.2  1.7 - 7.7 K/uL   Lymphocytes Relative 48 (*) 12 - 46 %   Lymphs Abs 3.6  0.7 - 4.0 K/uL   Monocytes Relative 7  3 - 12 %   Monocytes Absolute 0.5  0.1 - 1.0 K/uL   Eosinophils Relative 2  0 - 5 %   Eosinophils Absolute 0.2  0.0 - 0.7 K/uL   Basophils Relative 0  0 - 1 %   Basophils Absolute 0.0  0.0 - 0.1 K/uL  COMPREHENSIVE METABOLIC PANEL      Result Value Range   Sodium 134 (*) 135 - 145 mEq/L   Potassium 4.0  3.5 - 5.1 mEq/L   Chloride 95 (*) 96 - 112 mEq/L   CO2 23  19 - 32 mEq/L   Glucose, Bld 189 (*) 70 - 99 mg/dL   BUN 31 (*) 6 - 23 mg/dL   Creatinine, Ser 1.61  0.50 - 1.10 mg/dL    Calcium 9.4  8.4 - 09.6 mg/dL   Total Protein 7.8  6.0 - 8.3 g/dL   Albumin 3.3 (*) 3.5 - 5.2 g/dL   AST 14  0 - 37 U/L   ALT 5  0 - 35 U/L   Alkaline Phosphatase 127 (*) 39 - 117 U/L   Total Bilirubin 0.4  0.3 - 1.2 mg/dL   GFR calc non Af Amer 49 (*) >90 mL/min   GFR calc Af Amer 57 (*) >90 mL/min  TROPONIN I      Result Value Range   Troponin I <0.30  <0.30 ng/mL  GLUCOSE, CAPILLARY      Result Value Range   Glucose-Capillary 192 (*) 70 - 99 mg/dL   Comment 1 Notify RN    CARBAMAZEPINE LEVEL, TOTAL      Result Value Range   Carbamazepine Lvl 19.3 (*) 4.0 - 12.0 ug/mL  POCT I-STAT, CHEM 8      Result Value Range   Sodium 137  135 - 145 mEq/L   Potassium 4.0  3.5 - 5.1 mEq/L   Chloride 102  96 - 112 mEq/L   BUN 31 (*) 6 - 23 mg/dL   Creatinine, Ser 0.45 (*) 0.50 - 1.10 mg/dL   Glucose, Bld 409 (*) 70 - 99 mg/dL   Calcium, Ion 8.11 (*) 1.13 - 1.30 mmol/L   TCO2 24  0 - 100 mmol/L   Hemoglobin 11.6 (*) 12.0 - 15.0 g/dL   HCT 91.4 (*) 78.2 - 95.6 %  POCT I-STAT TROPONIN I      Result Value Range   Troponin i, poc 0.02  0.00 - 0.08 ng/mL   Comment 3            Ct Head Wo Contrast  06/15/2013   *RADIOLOGY REPORT*  Clinical Data: Code stroke  CT HEAD WITHOUT CONTRAST  Technique:  Contiguous axial images were obtained from the base of the skull through the vertex without contrast.  Comparison: CT 05/28/2013  Findings: Moderate atrophy.  Chronic right parietal infarct. Chronic left occipital infarct.  Chronic infarcts in the cerebellum bilaterally.  These are unchanged.  There is also chronic ischemia in the white matter and right basal ganglia.  Negative for acute infarct.  Negative for hemorrhage or mass lesion.  Air-fluid  level and mucosal thickening in the right maxillary sinus.  Chronic bony thickening of the left maxillary sinus.  No acute skull abnormality.  IMPRESSION: Atrophy and extensive chronic ischemia.  No superimposed acute infarct or hemorrhage.  Sinusitis.  Critical  Value/emergent results were called by telephone at the time of interpretation on 06/15/2013 at 1440 hours to Dr. Amada Jupiter, who verbally acknowledged these results.   Original Report Authenticated By: Janeece Riggers, M.D.   5:21 PM Tegretol level is high at 19.3.  Call to Dr. Amada Jupiter to discuss this finding.  Pt remains awake but confused.  Her BP is now about 180 systolic.  5:42 PM Case discussed with Dr. Amada Jupiter who thinks pt's symptoms are the result of her elevated Tegretol level.  He advised admission, holding Tegretol.   6:10 PM [facility] will admit to a telemetry bed, covering for Dr. Johnella Moloney.  Temporary admitting orders written, sliding scale insulin orders written.   1. Tegretol toxicity, initial encounter         Carleene Cooper III, MD 06/15/13 [phone]

## 2013-06-16 LAB — BASIC METABOLIC PANEL
CO2: 28 mEq/L (ref 19–32)
Calcium: 8.2 mg/dL — ABNORMAL LOW (ref 8.4–10.5)
Creatinine, Ser: 0.81 mg/dL (ref 0.50–1.10)

## 2013-06-16 LAB — GLUCOSE, CAPILLARY
Glucose-Capillary: 74 mg/dL (ref 70–99)
Glucose-Capillary: 86 mg/dL (ref 70–99)

## 2013-06-16 LAB — TROPONIN I: Troponin I: <0.3 ng/mL (ref <0.30)

## 2013-06-16 LAB — CBC
Hemoglobin: 9.5 g/dL — ABNORMAL LOW (ref 12.0–15.0)
MCH: 32 pg (ref 26.0–34.0)
MCHC: 33.3 g/dL (ref 30.0–36.0)
RDW: 13.7 % (ref 11.5–15.5)

## 2013-06-16 LAB — HEMOGLOBIN A1C: Mean Plasma Glucose: 134 mg/dL — ABNORMAL HIGH (ref <117)

## 2013-06-16 MED ORDER — CEFUROXIME AXETIL 250 MG PO TABS: 250.0000 mg | ORAL_TABLET | Freq: Two times a day (BID) | ORAL | Status: DC

## 2013-06-16 MED ORDER — LEVETIRACETAM 500 MG PO TABS: 500.0000 mg | ORAL_TABLET | Freq: Two times a day (BID) | ORAL | Status: AC

## 2013-06-16 MED ORDER — BIOTENE DRY MOUTH MT LIQD
15.0000 mL | Freq: Two times a day (BID) | OROMUCOSAL | Status: DC
  Administered 2013-06-16: 15 mL via OROMUCOSAL

## 2013-06-16 NOTE — Progress Notes (Signed)
Clinical Child psychotherapist (CSW) informed that pt is ready for discharge back to [facility]. CSW prepared and placed pt dc packet in shadow chart, notified pt and daughter and contacted [facility] for transport. RN given number for report. No additional CSW needs, CSW signing off.  Theresia Bough, MSW, Theresia Majors [phone]

## 2013-06-16 NOTE — Progress Notes (Signed)
Clinical Social Work Department BRIEF PSYCHOSOCIAL ASSESSMENT 06/16/2013  Patient:  [name] [name],[name] [name]     Account Number:  [id number]     Admit date:  06/15/2013  Clinical Social Worker:  Lourdes Sledge  Date/Time:  06/16/2013 11:00 AM  Referred by:  Physician  Date Referred:  06/16/2013 Referred for  SNF Placement   Other Referral:   Interview type:  Family Other interview type:   CSW completed assessment with daughter Dahlia Client [phone]    PSYCHOSOCIAL DATA Living Status:  FACILITY Admitted from facility:  [facility] [place] Level of care:  Skilled Nursing Facility Primary support name:  Dahlia Client Primary support relationship to patient:  CHILD, ADULT Degree of support available:   Pt daughter actively involved in pt care.    CURRENT CONCERNS Current Concerns  Post-Acute Placement   Other Concerns:    SOCIAL WORK ASSESSMENT / PLAN CSW informed that pt is from [facility] [place] and ready for discharge today.    CSW unable to completed assessment with pt is not oriented x 4. CSW completed assessment with daughter Debbi who confirmed that pt is from [facility] and the plan is for pt to return when medically ready.    CSW confirmed with Revonda Standard at [facility] that pt is able to return to the facility today. DC summary and AVS submitted to the facility.   Assessment/plan status:  Psychosocial Support/Ongoing Assessment of Needs Other assessment/ plan:   Information/referral to community resources:   No resources needed at this time.    PATIENT'S/FAMILY'S RESPONSE TO PLAN OF CARE: Assessment unable to be completed with pt due to capacity. Assessment completed with pt daughter who is agreeable for pt to return.    CSW to assist with dc.       Theresia Bough, MSW, Theresia Majors [phone]

## 2013-06-16 NOTE — Discharge Summary (Signed)
Physician Discharge Summary  NAME:[name] [name]  WJX:[id number]  DOB: December 05, 1927   Admit date: 06/15/2013 Discharge date: 06/16/2013  Discharge Diagnoses:  Active Problems: Altered mental status - likely secondary to seizure with postictal phase and possibly Todd's paralysis, resolving Tegretol toxicity - discontinuing Tegretol and changing to Keppra for continued difficulty with managing levels of Tegretol UTI - treat with Ceftin and followup at nursing home Chronic multi-infarct dementia Other medical problems stable   Discharge Physical Exam:  General Appearance: Alert, cooperative, no distress, appears stated age  Weight change:   Intake/Output Summary (Last 24 hours) at 06/16/13 0819 Last data filed at 06/16/13 0700  Gross per 24 hour  Intake      0 ml  Output    800 ml  Net   -800 ml   Filed Vitals:   06/15/13 2000 06/15/13 2200 06/16/13 0013 06/16/13 0400  BP: 133/54 127/54 139/43 118/56  Pulse: 60 60 62 58  Temp: 97.2 F (36.2 C) 97.4 F (36.3 C) 97.3 F (36.3 C) 97.9 F (36.6 C)  TempSrc:      Resp: 20 18 18 18   Height: 5\' 2"  (1.575 m)     Weight: 68.629 kg (151 lb 4.8 oz)     SpO2:  100% 98% 98%   MS: Awake, Alert, oriented to hospital but not  CN: PERRL, EOMI, does not reliably count fingers.  Motor: 5/5 throughout  Sensory:inconsistent answers when checking extinction, but endorses symmetric sensation.   Discharge Condition: Improved, back to baseline  Hospital Course:  Very pleasant 79 year old female with multi-infarct dementia and seizure disorder who had sudden episode of slumping over at the nursing home yesterday.  She was felt to likely have had a stroke and transferred for further evaluation.  Again she has been found to have a high level of Tegretol at reduced doses of Tegretol from admission last month.  She is also been found to have a UTI.  CT scan of the brain revealed no acute changes.  She is back to baseline this morning.  Recommendations from  neurology are for switching from Tegretol to Keppra and will be discharged on 500 milligrams Keppra twice daily.  We'll followup on UTI at the skilled nursing facility.  Prior to discharge today she will be evaluated by speech to make sure that her diet consistency is appropriate for swallowing function.  Things to follow up in the outpatient setting: Seizure activity, mental status, followup on UTI  Consults: Treatment Team:  Kym Groom, MD  Disposition: 03-Skilled Nursing Facility  Discharge Orders   Future Orders Complete By Expires     Call MD for:  difficulty breathing, headache or visual disturbances  As directed     Call MD for:  persistant nausea and vomiting  As directed     Call MD for:  severe uncontrolled pain  As directed     Call MD for:  temperature >100.4  As directed     Diet - low sodium heart healthy  As directed     Discharge instructions  As directed     Comments:      We'll be discontinuing Tegretol and starting Keppra 500 milligrams twice daily which can be increased to 1000 milligrams twice daily if necessary for seizure control    Increase activity slowly  As directed         Medication List    STOP taking these medications       carbamazepine 200 MG tablet  Commonly known as:  TEGRETOL     simvastatin 20 MG tablet  Commonly known as:  ZOCOR      TAKE these medications       ALPRAZolam 0.25 MG tablet  Commonly known as:  XANAX  Take 0.125 mg by mouth every 8 (eight) hours as needed for anxiety.     aspirin 81 MG EC tablet  Take 1 tablet (81 mg total) by mouth daily.     cefUROXime 250 MG tablet  Commonly known as:  CEFTIN  Take 1 tablet (250 mg total) by mouth 2 (two) times daily.     cholecalciferol 1000 UNITS tablet  Commonly known as:  VITAMIN D  Take 1,000 Units by mouth 2 (two) times daily.     furosemide 40 MG tablet  Commonly known as:  LASIX  Take 1 tablet (40 mg total) by mouth daily.     glyBURIDE 2.5 MG tablet  Commonly  known as:  DIABETA  Take 2.5 mg by mouth every morning.     insulin aspart 100 UNIT/ML injection  Commonly known as:  novoLOG  Inject 0-10 Units into the skin 3 (three) times daily with meals. Blood sugar under 150, give 0 units; 150-200 give 2 units; 201-250 give 4 units; 251-300 give 6 units, 301-350 give 8 units, and 351--400 give 10 units.  Above 400, call MD.     isosorbide mononitrate 60 MG 24 hr tablet  Commonly known as:  IMDUR  Take 60 mg by mouth daily.     LASTACAFT 0.25 % Soln  Generic drug:  Alcaftadine  Place 1 drop into both eyes daily.     levETIRAcetam 500 MG tablet  Commonly known as:  KEPPRA  Take 1 tablet (500 mg total) by mouth every 12 (twelve) hours.     metoprolol succinate 25 MG 24 hr tablet  Commonly known as:  TOPROL-XL  Take 50 mg by mouth 2 (two) times daily.     naphazoline-pheniramine 0.025-0.3 % ophthalmic solution  Commonly known as:  NAPHCON-A  Place 1 drop into both eyes daily. 1 drop in each eye daily as needed for eye itching     nitroGLYCERIN 0.4 MG SL tablet  Commonly known as:  NITROSTAT  Place 0.4 mg under the tongue every 5 (five) minutes x 3 doses as needed. For chest pain     omeprazole 20 MG capsule  Commonly known as:  PRILOSEC  Take 20 mg by mouth daily before breakfast. Take on empty stomach at 630 am     telmisartan 80 MG tablet  Commonly known as:  MICARDIS  Take 80 mg by mouth daily.         The results of significant diagnostics from this hospitalization (including imaging, microbiology, ancillary and laboratory) are listed below for reference.    Significant Diagnostic Studies: Ct Head Wo Contrast  06/15/2013   *RADIOLOGY REPORT*  Clinical Data: Code stroke  CT HEAD WITHOUT CONTRAST  Technique:  Contiguous axial images were obtained from the base of the skull through the vertex without contrast.  Comparison: CT 05/28/2013  Findings: Moderate atrophy.  Chronic right parietal infarct. Chronic left occipital infarct.   Chronic infarcts in the cerebellum bilaterally.  These are unchanged.  There is also chronic ischemia in the white matter and right basal ganglia.  Negative for acute infarct.  Negative for hemorrhage or mass lesion.  Air-fluid level and mucosal thickening in the right maxillary sinus.  Chronic bony thickening of the left maxillary sinus.  No acute  skull abnormality.  IMPRESSION: Atrophy and extensive chronic ischemia.  No superimposed acute infarct or hemorrhage.  Sinusitis.  Critical Value/emergent results were called by telephone at the time of interpretation on 06/15/2013 at 1440 hours to Dr. Amada Jupiter, who verbally acknowledged these results.   Original Report Authenticated By: Janeece Riggers, M.D.   Ct Head Wo Contrast  05/28/2013   *RADIOLOGY REPORT*  Clinical Data: Decreased level of consciousness with speech changes.  Code stroke.  CT HEAD WITHOUT CONTRAST  Technique:  Contiguous axial images were obtained from the base of the skull through the vertex without contrast.  Comparison: Head CT 04/02/2013.  Findings: Old right parietal and left temporal occipital infarcts are stable.  There are stable chronic infarcts in the right lentiform nuclei.  No acute intracranial hemorrhage, mass lesion, brain edema or extra-axial fluid collection is seen.  There is no evidence of acute cortical based infarct.  Generalized atrophy is unchanged.  There are postsurgical changes status post right mastoid resection. Paranasal sinus postsurgical changes are also present.  There is diffuse calvarial hyperostosis.  IMPRESSION:  1.  Stable chronic findings with old infarcts bilaterally as described. 2.  No CT evidence of acute infarction or hemorrhage.  Critical Value/emergent results were called by telephone at the time of interpretation on 05/28/2013 at 1127 hours to Dr. Roseanne Reno, who verbally acknowledged these results.   Original Report Authenticated By: Carey Bullocks, M.D.    Microbiology: Recent Results (from the  past 240 hour(s))  MRSA PCR SCREENING     Status: None   Collection Time    06/15/13 10:58 PM      Result Value Range Status   MRSA by PCR NEGATIVE  NEGATIVE Final   Comment:            The GeneXpert MRSA Assay (FDA     approved for NASAL specimens     only), is one component of a     comprehensive MRSA colonization     surveillance program. It is not     intended to diagnose MRSA     infection nor to guide or     monitor treatment for     MRSA infections.     Labs: Results for orders placed during the hospital encounter of 06/15/13  MRSA PCR SCREENING      Result Value Range   MRSA by PCR NEGATIVE  NEGATIVE  ETHANOL      Result Value Range   Alcohol, Ethyl (B) <11  0 - 11 mg/dL  PROTIME-INR      Result Value Range   Prothrombin Time 12.6  11.6 - 15.2 seconds   INR 0.96  0.00 - 1.49  APTT      Result Value Range   aPTT 25  24 - 37 seconds  CBC      Result Value Range   WBC 7.5  4.0 - 10.5 K/uL   RBC 3.36 (*) 3.87 - 5.11 MIL/uL   Hemoglobin 10.9 (*) 12.0 - 15.0 g/dL   HCT 16.1 (*) 09.6 - 04.5 %   MCV 96.1  78.0 - 100.0 fL   MCH 32.4  26.0 - 34.0 pg   MCHC 33.7  30.0 - 36.0 g/dL   RDW 40.9  81.1 - 91.4 %   Platelets 244  150 - 400 K/uL  DIFFERENTIAL      Result Value Range   Neutrophils Relative % 43  43 - 77 %   Neutro Abs 3.2  1.7 - 7.7 K/uL  Lymphocytes Relative 48 (*) 12 - 46 %   Lymphs Abs 3.6  0.7 - 4.0 K/uL   Monocytes Relative 7  3 - 12 %   Monocytes Absolute 0.5  0.1 - 1.0 K/uL   Eosinophils Relative 2  0 - 5 %   Eosinophils Absolute 0.2  0.0 - 0.7 K/uL   Basophils Relative 0  0 - 1 %   Basophils Absolute 0.0  0.0 - 0.1 K/uL  COMPREHENSIVE METABOLIC PANEL      Result Value Range   Sodium 134 (*) 135 - 145 mEq/L   Potassium 4.0  3.5 - 5.1 mEq/L   Chloride 95 (*) 96 - 112 mEq/L   CO2 23  19 - 32 mEq/L   Glucose, Bld 189 (*) 70 - 99 mg/dL   BUN 31 (*) 6 - 23 mg/dL   Creatinine, Ser 4.09  0.50 - 1.10 mg/dL   Calcium 9.4  8.4 - 81.1 mg/dL   Total  Protein 7.8  6.0 - 8.3 g/dL   Albumin 3.3 (*) 3.5 - 5.2 g/dL   AST 14  0 - 37 U/L   ALT 5  0 - 35 U/L   Alkaline Phosphatase 127 (*) 39 - 117 U/L   Total Bilirubin 0.4  0.3 - 1.2 mg/dL   GFR calc non Af Amer 49 (*) >90 mL/min   GFR calc Af Amer 57 (*) >90 mL/min  TROPONIN I      Result Value Range   Troponin I <0.30  <0.30 ng/mL  URINE RAPID DRUG SCREEN (HOSP PERFORMED)      Result Value Range   Opiates NONE DETECTED  NONE DETECTED   Cocaine NONE DETECTED  NONE DETECTED   Benzodiazepines NONE DETECTED  NONE DETECTED   Amphetamines NONE DETECTED  NONE DETECTED   Tetrahydrocannabinol NONE DETECTED  NONE DETECTED   Barbiturates NONE DETECTED  NONE DETECTED  GLUCOSE, CAPILLARY      Result Value Range   Glucose-Capillary 192 (*) 70 - 99 mg/dL   Comment 1 Notify RN    CARBAMAZEPINE LEVEL, TOTAL      Result Value Range   Carbamazepine Lvl 19.3 (*) 4.0 - 12.0 ug/mL  URINALYSIS, ROUTINE W REFLEX MICROSCOPIC      Result Value Range   Color, Urine YELLOW  YELLOW   APPearance TURBID (*) CLEAR   Specific Gravity, Urine 1.009  1.005 - 1.030   pH 6.0  5.0 - 8.0   Glucose, UA NEGATIVE  NEGATIVE mg/dL   Hgb urine dipstick SMALL (*) NEGATIVE   Bilirubin Urine NEGATIVE  NEGATIVE   Ketones, ur NEGATIVE  NEGATIVE mg/dL   Protein, ur NEGATIVE  NEGATIVE mg/dL   Urobilinogen, UA 0.2  0.0 - 1.0 mg/dL   Nitrite NEGATIVE  NEGATIVE   Leukocytes, UA LARGE (*) NEGATIVE  URINE MICROSCOPIC-ADD ON      Result Value Range   WBC, UA TOO NUMEROUS TO COUNT  <3 WBC/hpf   RBC / HPF 3-6  <3 RBC/hpf   Bacteria, UA MANY (*) RARE   Urine-Other LESS THAN 10 mL OF URINE SUBMITTED    HEMOGLOBIN A1C      Result Value Range   Hemoglobin A1C 6.3 (*) <5.7 %   Mean Plasma Glucose 134 (*) <117 mg/dL  GLUCOSE, CAPILLARY      Result Value Range   Glucose-Capillary 167 (*) 70 - 99 mg/dL  CBC      Result Value Range   WBC 5.7  4.0 - 10.5 K/uL  RBC 3.03 (*) 3.87 - 5.11 MIL/uL   Hemoglobin 9.7 (*) 12.0 - 15.0 g/dL    HCT 95.6 (*) 21.3 - 46.0 %   MCV 95.0  78.0 - 100.0 fL   MCH 32.0  26.0 - 34.0 pg   MCHC 33.7  30.0 - 36.0 g/dL   RDW 08.6  57.8 - 46.9 %   Platelets 212  150 - 400 K/uL  CREATININE, SERUM      Result Value Range   Creatinine, Ser 0.91  0.50 - 1.10 mg/dL   GFR calc non Af Amer 57 (*) >90 mL/min   GFR calc Af Amer 66 (*) >90 mL/min  CBC      Result Value Range   WBC 4.6  4.0 - 10.5 K/uL   RBC 2.97 (*) 3.87 - 5.11 MIL/uL   Hemoglobin 9.5 (*) 12.0 - 15.0 g/dL   HCT 62.9 (*) 52.8 - 41.3 %   MCV 96.0  78.0 - 100.0 fL   MCH 32.0  26.0 - 34.0 pg   MCHC 33.3  30.0 - 36.0 g/dL   RDW 24.4  01.0 - 27.2 %   Platelets 205  150 - 400 K/uL  CK      Result Value Range   Total CK 83  7 - 177 U/L  TROPONIN I      Result Value Range   Troponin I <0.30  <0.30 ng/mL  TROPONIN I      Result Value Range   Troponin I <0.30  <0.30 ng/mL  GLUCOSE, CAPILLARY      Result Value Range   Glucose-Capillary 133 (*) 70 - 99 mg/dL  GLUCOSE, CAPILLARY      Result Value Range   Glucose-Capillary 88  70 - 99 mg/dL  GLUCOSE, CAPILLARY      Result Value Range   Glucose-Capillary 74  70 - 99 mg/dL  BASIC METABOLIC PANEL      Result Value Range   Sodium 140  135 - 145 mEq/L   Potassium 3.4 (*) 3.5 - 5.1 mEq/L   Chloride 103  96 - 112 mEq/L   CO2 28  19 - 32 mEq/L   Glucose, Bld 79  70 - 99 mg/dL   BUN 25 (*) 6 - 23 mg/dL   Creatinine, Ser 5.36  0.50 - 1.10 mg/dL   Calcium 8.2 (*) 8.4 - 10.5 mg/dL   GFR calc non Af Amer 66 (*) >90 mL/min   GFR calc Af Amer 76 (*) >90 mL/min  GLUCOSE, CAPILLARY      Result Value Range   Glucose-Capillary 86  70 - 99 mg/dL   Comment 1 Notify RN    POCT I-STAT, CHEM 8      Result Value Range   Sodium 137  135 - 145 mEq/L   Potassium 4.0  3.5 - 5.1 mEq/L   Chloride 102  96 - 112 mEq/L   BUN 31 (*) 6 - 23 mg/dL   Creatinine, Ser 6.44 (*) 0.50 - 1.10 mg/dL   Glucose, Bld 034 (*) 70 - 99 mg/dL   Calcium, Ion 7.42 (*) 1.13 - 1.30 mmol/L   TCO2 24  0 - 100 mmol/L    Hemoglobin 11.6 (*) 12.0 - 15.0 g/dL   HCT 59.5 (*) 63.8 - 75.6 %  POCT I-STAT TROPONIN I      Result Value Range   Troponin i, poc 0.02  0.00 - 0.08 ng/mL   Comment 3  Time coordinating discharge: 34 minutes  Signed: Pearla Dubonnet, MD 06/16/2013, 8:19 AM

## 2013-06-16 NOTE — Progress Notes (Signed)
Utilization review completed.  

## 2013-06-16 NOTE — Progress Notes (Signed)
PT Cancellation Note  Patient Details Name: [name] [name] MRN: [id number] DOB: 1928-03-03   Cancelled Treatment:    Reason Eval/Treat Not Completed: Other (comment) (Defer to SNF)   Fabio Asa 06/16/2013, 12:54 PM

## 2013-06-16 NOTE — Progress Notes (Signed)
Pt discharged to [facility] per MD order. Pt received and reviewed all discharge instructions and medication information including follow-up and prescription information. Pt verbalized understanding. Pt alert and oriented at discharge with no complaints of pain. Report given to receiving RN at [facility] prior to discharge. Pt transported to facility via [facility].  Efraim Kaufmann

## 2013-06-16 NOTE — Evaluation (Signed)
Clinical/Bedside Swallow Evaluation Patient Details  Name: [name] [name] MRN: [id number] Date of Birth: 30-Dec-1927  Today's Date: 06/16/2013 Time: 4098-1191 SLP Time Calculation (min): 17 min  Past Medical History:  Past Medical History  Diagnosis Date  . Hypertension   . Acid reflux   . Seizures   . Coronary artery disease 2003    60-75%D2, 50% D1, 25% left circ, 20% LAD, normal LVF  . Dyslipidemia   . LBBB (left bundle branch block)   . Confusion   . Pacemaker   . Fall at nursing home 03/29/2013  . Type II diabetes mellitus   . Myocardial infarction     "long time ago" (03/31/2013)  . Exertional shortness of breath     "once in awhile" (03/31/2013)  . Anxiety   . Multi-infarct dementia   . Acute pulmonary edema   . Stroke     dennies residual 03/31/2013   Past Surgical History:  Past Surgical History  Procedure Laterality Date  . Cholecystectomy    . Vaginal hysterectomy    . Appendectomy    . Iridectomy    . Cardiac catheterization    . Eye surgery      cataracts/ iridectomy  . Tonsillectomy and adenoidectomy    . Mastoidectomy Right   . Cataract extraction w/ intraocular lens  implant, bilateral    . Dilation and curettage of uterus    . Tubal ligation    . Insert / replace / remove pacemaker  06/20/2012   HPI:  79 yo female adm to [facility] with AMS, pt slumped over while consuming meal with dysarthria and aphasia.  Pt CT head negative - atrophy and extensive ischemia.  UTI, Acute renal failure, DM, h/o mastoidectomy also in pt's medical record.  Pt has h/o dementia, CAD s/p CABG, HTN, MI, reflux, confusion, CVA 03/2013.     Assessment / Plan / Recommendation Clinical Impression  Pt without focal CN deficits but accepted minimal po intake.  Pt with signs of mild oral dysphagia to solids- delayed oral transiting, decreased mastication with mild oral stasis.  Applesauce and liquids aid clearance of mild cracker residuals. Suspect cognitive based deficits only.   SLP phoned  SNF who reported pt was on a NCS, NAS, regular/chopped meat diet at facility.  Intake was inconsistent per nurse.  Rec regular/ground meats/thin with precautions.      Aspiration Risk  Mild    Diet Recommendation Regular;Thin liquid (ground meats)   Liquid Administration via: Cup;Straw Medication Administration: Whole meds with puree Supervision: Patient able to self feed;Intermittent supervision to cue for compensatory strategies Compensations: Small sips/bites;Check for pocketing Postural Changes and/or Swallow Maneuvers: Out of bed for meals;Upright 30-60 min after meal    Other  Recommendations Oral Care Recommendations: Oral care BID   Follow Up Recommendations    n/a   Frequency and Duration     n/a   Pertinent Vitals/Pain Afebrile, decreased    SLP Swallow Goals     Swallow Study Prior Functional Status   on regular/chopped/thin at facility    General Date of Onset: 06/16/13 HPI: 79 yo female adm to [facility] with AMS, pt slumped over while consuming meal with dysarthria and aphasia.  Pt CT head negative - atrophy and extensive ischemia.  UTI, Acute renal failure, DM, h/o mastoidectomy also in pt's medical record.  Pt has h/o dementia, CAD s/p CABG, HTN, MI, reflux, confusion, CVA 03/2013.   Type of Study: Bedside swallow evaluation Diet Prior to this Study: NPO Temperature Spikes  Noted: No Respiratory Status: Room air Behavior/Cognition: Alert;Cooperative;Confused;Pleasant mood Oral Cavity - Dentition: Dentures, top;Dentures, bottom Self-Feeding Abilities: Able to feed self Patient Positioning: Upright in bed Baseline Vocal Quality: Clear Volitional Cough: Strong;Weak Volitional Swallow: Able to elicit    Oral/Motor/Sensory Function Overall Oral Motor/Sensory Function: Appears within functional limits for tasks assessed   Ice Chips Ice chips: Not tested   Thin Liquid Thin Liquid: Within functional limits Presentation: Cup;Self Fed;Straw    Nectar Thick Nectar Thick  Liquid: Not tested   Honey Thick Honey Thick Liquid: Not tested   Puree Puree: Impaired Presentation: Spoon Oral Phase Impairments: Reduced lingual movement/coordination Oral Phase Functional Implications: Prolonged oral transit Pharyngeal Phase Impairments: Suspected delayed Swallow   Solid   GO    Solid: Impaired Oral Phase Impairments: Impaired anterior to posterior transit Oral Phase Functional Implications: Oral holding Pharyngeal Phase Impairments: Suspected delayed Fredric Mare, MS [facility] SLP [phone]

## 2013-06-16 NOTE — Progress Notes (Signed)
Subjective: Much improved overnight, though still with some confusion.   Exam: Filed Vitals:   06/16/13 0400  BP: 118/56  Pulse: 58  Temp: 97.9 F (36.6 C)  Resp: 18   Gen: In bed, NAD MS: Awake, Alert, oriented to hospital but not  CN: PERRL, EOMI, does not reliably count fingers.  Motor: 5/5 throughout Sensory:inconsistent answers when checking extinction, but endorses symmetric sensation.    Impression: 79 yo F with some dementia at baseline who I suspect had a seizure yesterday. She also had a high level of tegretol. I would favor changing to Keppra for seizure control given that she has now had a couple of episodes of tegretol toxicity.   Recommendations: 1) Would change to Keppra 500mg  BID given history of toxicity and breakthrough seizures.   2) If back to baseline, then agree that she seems stable to go back to home today.    Ritta Slot, MD [facility] [phone]  If 7pm- 7am, please page neurology on call at [phone].

## 2013-06-17 NOTE — Progress Notes (Signed)
06/16/13 0854  SLP G-Codes **NOT FOR INPATIENT CLASS**  Functional Assessment Tool Used clinical impression  Functional Limitations Swallowing  Swallow Current Status (Z6109) CI  Swallow Goal Status (U0454) CI  Swallow Discharge Status (U9811) CI  SLP Evaluations  $ SLP Speech Visit 1 Procedure  SLP Evaluations  $BSS Swallow 1 Procedure  $Self Care/Home Management 8-22

## 2013-06-17 NOTE — Progress Notes (Deleted)
06/16/13 0854  SLP G-Codes **NOT FOR INPATIENT CLASS**  Functional Limitations Swallowing  Swallow Current Status (Y7829) CI  Swallow Goal Status (F6213) CI  Swallow Discharge Status (Y8657) CI  SLP Evaluations  $ SLP Speech Visit 1 Procedure  SLP Evaluations  $BSS Swallow 1 Procedure  $Self Care/Home Management 8-22

## 2013-06-18 LAB — URINE CULTURE

## 2013-10-08 ENCOUNTER — Ambulatory Visit (INDEPENDENT_AMBULATORY_CARE_PROVIDER_SITE_OTHER): Payer: Medicare Other | Admitting: *Deleted

## 2013-10-08 ENCOUNTER — Encounter: Payer: Self-pay | Admitting: Internal Medicine

## 2013-10-08 DIAGNOSIS — I495 Sick sinus syndrome: Secondary | ICD-10-CM

## 2013-10-08 LAB — PACEMAKER DEVICE OBSERVATION
AL THRESHOLD: 0.5 V
RV LEAD AMPLITUDE: 31.36 mv
RV LEAD THRESHOLD: 0.5 V

## 2013-10-08 NOTE — Progress Notes (Signed)
Pacemaker check in clinic. Normal device function. Thresholds, sensing, impedances consistent with previous measurements. Device programmed to maximize longevity. 5 mode switches--longest was 7 minutes 24 seconds. No high ventricular rates noted. Device programmed at appropriate safety margins. Histogram distribution appropriate for patient activity level. Device programmed to optimize intrinsic conduction. Estimated longevity 10 years. Patient enrolled in remote follow-up. ROV in 3 mths w/GT.

## 2014-01-13 ENCOUNTER — Encounter: Payer: Medicare Other | Admitting: Internal Medicine

## 2014-03-19 ENCOUNTER — Observation Stay (HOSPITAL_COMMUNITY)
Admission: EM | Admit: 2014-03-19 | Discharge: 2014-03-20 | Disposition: A | Discharge status: Skilled Nursing Facility | Payer: Medicare Other | Attending: Internal Medicine | Admitting: Internal Medicine

## 2014-03-19 ENCOUNTER — Encounter (HOSPITAL_COMMUNITY): Payer: Self-pay | Admitting: Emergency Medicine

## 2014-03-19 ENCOUNTER — Emergency Department (HOSPITAL_COMMUNITY): Payer: Medicare Other

## 2014-03-19 DIAGNOSIS — Z7982 Long term (current) use of aspirin: Secondary | ICD-10-CM | POA: Insufficient documentation

## 2014-03-19 DIAGNOSIS — E119 Type 2 diabetes mellitus without complications: Secondary | ICD-10-CM | POA: Diagnosis present

## 2014-03-19 DIAGNOSIS — I252 Old myocardial infarction: Secondary | ICD-10-CM | POA: Insufficient documentation

## 2014-03-19 DIAGNOSIS — I129 Hypertensive chronic kidney disease with stage 1 through stage 4 chronic kidney disease, or unspecified chronic kidney disease: Secondary | ICD-10-CM | POA: Insufficient documentation

## 2014-03-19 DIAGNOSIS — J449 Chronic obstructive pulmonary disease, unspecified: Secondary | ICD-10-CM | POA: Insufficient documentation

## 2014-03-19 DIAGNOSIS — F0150 Vascular dementia without behavioral disturbance: Secondary | ICD-10-CM | POA: Insufficient documentation

## 2014-03-19 DIAGNOSIS — E1149 Type 2 diabetes mellitus with other diabetic neurological complication: Secondary | ICD-10-CM | POA: Insufficient documentation

## 2014-03-19 DIAGNOSIS — Z8673 Personal history of transient ischemic attack (TIA), and cerebral infarction without residual deficits: Secondary | ICD-10-CM | POA: Insufficient documentation

## 2014-03-19 DIAGNOSIS — R269 Unspecified abnormalities of gait and mobility: Secondary | ICD-10-CM | POA: Insufficient documentation

## 2014-03-19 DIAGNOSIS — Z794 Long term (current) use of insulin: Secondary | ICD-10-CM | POA: Insufficient documentation

## 2014-03-19 DIAGNOSIS — E1129 Type 2 diabetes mellitus with other diabetic kidney complication: Secondary | ICD-10-CM | POA: Insufficient documentation

## 2014-03-19 DIAGNOSIS — N183 Chronic kidney disease, stage 3 unspecified: Secondary | ICD-10-CM | POA: Insufficient documentation

## 2014-03-19 DIAGNOSIS — I5032 Chronic diastolic (congestive) heart failure: Secondary | ICD-10-CM | POA: Insufficient documentation

## 2014-03-19 DIAGNOSIS — E785 Hyperlipidemia, unspecified: Secondary | ICD-10-CM | POA: Insufficient documentation

## 2014-03-19 DIAGNOSIS — I672 Cerebral atherosclerosis: Secondary | ICD-10-CM | POA: Insufficient documentation

## 2014-03-19 DIAGNOSIS — R079 Chest pain, unspecified: Principal | ICD-10-CM

## 2014-03-19 DIAGNOSIS — Z950 Presence of cardiac pacemaker: Secondary | ICD-10-CM | POA: Insufficient documentation

## 2014-03-19 DIAGNOSIS — N19 Unspecified kidney failure: Secondary | ICD-10-CM | POA: Diagnosis present

## 2014-03-19 DIAGNOSIS — F411 Generalized anxiety disorder: Secondary | ICD-10-CM | POA: Insufficient documentation

## 2014-03-19 DIAGNOSIS — R569 Unspecified convulsions: Secondary | ICD-10-CM | POA: Insufficient documentation

## 2014-03-19 DIAGNOSIS — W19XXXA Unspecified fall, initial encounter: Secondary | ICD-10-CM

## 2014-03-19 DIAGNOSIS — I509 Heart failure, unspecified: Secondary | ICD-10-CM | POA: Insufficient documentation

## 2014-03-19 DIAGNOSIS — K219 Gastro-esophageal reflux disease without esophagitis: Secondary | ICD-10-CM | POA: Diagnosis present

## 2014-03-19 DIAGNOSIS — I2510 Atherosclerotic heart disease of native coronary artery without angina pectoris: Secondary | ICD-10-CM | POA: Insufficient documentation

## 2014-03-19 DIAGNOSIS — J4489 Other specified chronic obstructive pulmonary disease: Secondary | ICD-10-CM | POA: Insufficient documentation

## 2014-03-19 DIAGNOSIS — I447 Left bundle-branch block, unspecified: Secondary | ICD-10-CM

## 2014-03-19 DIAGNOSIS — I10 Essential (primary) hypertension: Secondary | ICD-10-CM

## 2014-03-19 DIAGNOSIS — E1142 Type 2 diabetes mellitus with diabetic polyneuropathy: Secondary | ICD-10-CM | POA: Insufficient documentation

## 2014-03-19 LAB — COMPREHENSIVE METABOLIC PANEL
ALBUMIN: 3.6 g/dL (ref 3.5–5.2)
ALT: 16 U/L (ref 0–35)
AST: 21 U/L (ref 0–37)
Alkaline Phosphatase: 171 U/L — ABNORMAL HIGH (ref 39–117)
BUN: 55 mg/dL — AB (ref 6–23)
CALCIUM: 9.3 mg/dL (ref 8.4–10.5)
CO2: 21 mEq/L (ref 19–32)
CREATININE: 2.14 mg/dL — AB (ref 0.50–1.10)
Chloride: 101 mEq/L (ref 96–112)
GFR calc non Af Amer: 20 mL/min — ABNORMAL LOW (ref >90)
GFR, EST AFRICAN AMERICAN: 23 mL/min — AB (ref >90)
GLUCOSE: 127 mg/dL — AB (ref 70–99)
Potassium: 5.3 mEq/L (ref 3.7–5.3)
Sodium: 139 mEq/L (ref 137–147)
Total Bilirubin: 0.4 mg/dL (ref 0.3–1.2)
Total Protein: 7.5 g/dL (ref 6.0–8.3)

## 2014-03-19 LAB — URINALYSIS, ROUTINE W REFLEX MICROSCOPIC
Bilirubin Urine: NEGATIVE
Glucose, UA: NEGATIVE mg/dL
Hgb urine dipstick: NEGATIVE
Ketones, ur: NEGATIVE mg/dL
LEUKOCYTES UA: NEGATIVE
Nitrite: NEGATIVE
Protein, ur: NEGATIVE mg/dL
Specific Gravity, Urine: 1.016 (ref 1.005–1.030)
UROBILINOGEN UA: 0.2 mg/dL (ref 0.0–1.0)
pH: 5 (ref 5.0–8.0)

## 2014-03-19 LAB — PRO B NATRIURETIC PEPTIDE: Pro B Natriuretic peptide (BNP): 423.2 pg/mL (ref 0–450)

## 2014-03-19 LAB — CBC
HCT: 30.9 % — ABNORMAL LOW (ref 36.0–46.0)
Hemoglobin: 10.2 g/dL — ABNORMAL LOW (ref 12.0–15.0)
MCH: 32.4 pg (ref 26.0–34.0)
MCHC: 33 g/dL (ref 30.0–36.0)
MCV: 98.1 fL (ref 78.0–100.0)
Platelets: 209 10*3/uL (ref 150–400)
RBC: 3.15 MIL/uL — ABNORMAL LOW (ref 3.87–5.11)
RDW: 12.8 % (ref 11.5–15.5)
WBC: 6.8 10*3/uL (ref 4.0–10.5)

## 2014-03-19 LAB — TROPONIN I: Troponin I: <0.3 ng/mL (ref <0.30)

## 2014-03-19 LAB — GLUCOSE, CAPILLARY
Glucose-Capillary: 145 mg/dL — ABNORMAL HIGH (ref 70–99)
Glucose-Capillary: 192 mg/dL — ABNORMAL HIGH (ref 70–99)

## 2014-03-19 LAB — PROTIME-INR
INR: 0.96 (ref 0.00–1.49)
PROTHROMBIN TIME: 12.6 s (ref 11.6–15.2)

## 2014-03-19 LAB — I-STAT TROPONIN, ED
TROPONIN I, POC: 0.02 ng/mL (ref 0.00–0.08)
Troponin i, poc: 0.02 ng/mL (ref 0.00–0.08)

## 2014-03-19 LAB — MRSA PCR SCREENING: MRSA BY PCR: NEGATIVE

## 2014-03-19 MED ORDER — SODIUM CHLORIDE 0.9 % IV SOLN
INTRAVENOUS | Status: AC
  Administered 2014-03-19 (×2): via INTRAVENOUS

## 2014-03-19 MED ORDER — GI COCKTAIL ~~LOC~~
30.0000 mL | Freq: Four times a day (QID) | ORAL | Status: DC | PRN
  Filled 2014-03-19: qty 30

## 2014-03-19 MED ORDER — CALCIUM CARBONATE ANTACID 500 MG PO CHEW
2.0000 | CHEWABLE_TABLET | ORAL | Status: DC | PRN
  Filled 2014-03-19: qty 2

## 2014-03-19 MED ORDER — ALPRAZOLAM 0.25 MG PO TABS: 0.1250 mg | ORAL_TABLET | Freq: Three times a day (TID) | ORAL | Status: DC | PRN

## 2014-03-19 MED ORDER — ISOSORBIDE MONONITRATE ER 60 MG PO TB24
60.0000 mg | ORAL_TABLET | Freq: Every day | ORAL | Status: DC
  Administered 2014-03-19 – 2014-03-20 (×2): 60 mg via ORAL
  Filled 2014-03-19 (×2): qty 1

## 2014-03-19 MED ORDER — INSULIN GLARGINE 100 UNIT/ML ~~LOC~~ SOLN
10.0000 [IU] | SUBCUTANEOUS | Status: DC
  Administered 2014-03-19: 10 [IU] via SUBCUTANEOUS
  Filled 2014-03-19 (×2): qty 0.1

## 2014-03-19 MED ORDER — PANTOPRAZOLE SODIUM 40 MG PO TBEC
40.0000 mg | DELAYED_RELEASE_TABLET | Freq: Every day | ORAL | Status: DC
  Administered 2014-03-20: 40 mg via ORAL
  Filled 2014-03-19 (×2): qty 1

## 2014-03-19 MED ORDER — IRBESARTAN 300 MG PO TABS
300.0000 mg | ORAL_TABLET | Freq: Every day | ORAL | Status: DC
  Administered 2014-03-20: 300 mg via ORAL
  Filled 2014-03-19 (×2): qty 1

## 2014-03-19 MED ORDER — KETOTIFEN FUMARATE 0.025 % OP SOLN
1.0000 [drp] | Freq: Two times a day (BID) | OPHTHALMIC | Status: DC
  Administered 2014-03-19 – 2014-03-20 (×2): 1 [drp] via OPHTHALMIC
  Filled 2014-03-19: qty 5

## 2014-03-19 MED ORDER — INSULIN GLARGINE 100 UNIT/ML ~~LOC~~ SOLN
7.0000 [IU] | SUBCUTANEOUS | Status: DC
  Administered 2014-03-20: 7 [IU] via SUBCUTANEOUS
  Filled 2014-03-19 (×2): qty 0.07

## 2014-03-19 MED ORDER — NAPHAZOLINE-PHENIRAMINE 0.025-0.3 % OP SOLN
1.0000 [drp] | Freq: Every day | OPHTHALMIC | Status: DC
  Administered 2014-03-19 – 2014-03-20 (×2): 1 [drp] via OPHTHALMIC
  Filled 2014-03-19: qty 15

## 2014-03-19 MED ORDER — HEPARIN SODIUM (PORCINE) 5000 UNIT/ML IJ SOLN
5000.0000 [IU] | Freq: Three times a day (TID) | INTRAMUSCULAR | Status: DC
  Administered 2014-03-19 – 2014-03-20 (×2): 5000 [IU] via SUBCUTANEOUS
  Filled 2014-03-19 (×5): qty 1

## 2014-03-19 MED ORDER — ASPIRIN EC 81 MG PO TBEC
81.0000 mg | DELAYED_RELEASE_TABLET | Freq: Every day | ORAL | Status: DC
  Administered 2014-03-19 – 2014-03-20 (×2): 81 mg via ORAL
  Filled 2014-03-19 (×2): qty 1

## 2014-03-19 MED ORDER — INSULIN GLARGINE 100 UNIT/ML ~~LOC~~ SOLN: 7.0000 [IU] | Freq: Two times a day (BID) | SUBCUTANEOUS | Status: DC

## 2014-03-19 MED ORDER — INSULIN ASPART 100 UNIT/ML ~~LOC~~ SOLN
0.0000 [IU] | Freq: Three times a day (TID) | SUBCUTANEOUS | Status: DC
  Administered 2014-03-19: 1 [IU] via SUBCUTANEOUS
  Administered 2014-03-20: 2 [IU] via SUBCUTANEOUS

## 2014-03-19 MED ORDER — METOPROLOL SUCCINATE ER 50 MG PO TB24
50.0000 mg | ORAL_TABLET | Freq: Two times a day (BID) | ORAL | Status: DC
  Administered 2014-03-19 – 2014-03-20 (×2): 50 mg via ORAL
  Filled 2014-03-19 (×3): qty 1

## 2014-03-19 MED ORDER — INSULIN ASPART 100 UNIT/ML ~~LOC~~ SOLN: 0.0000 [IU] | Freq: Every day | SUBCUTANEOUS | Status: DC

## 2014-03-19 MED ORDER — LEVETIRACETAM 500 MG PO TABS
500.0000 mg | ORAL_TABLET | Freq: Two times a day (BID) | ORAL | Status: DC
  Administered 2014-03-19 – 2014-03-20 (×2): 500 mg via ORAL
  Filled 2014-03-19 (×3): qty 1

## 2014-03-19 NOTE — ED Notes (Signed)
Attempted report 

## 2014-03-19 NOTE — H&P (Signed)
[facility] History and Physical  [name] ShiLois Stetzel ZOX:[id number] DOB: 04/24/1928 DOA: 03/19/2014  Referring physician: er PCP: Pearla DubonnetGATES,ROBERT NEVILL, MD   Chief Complaint: unknown- possible fall and possible chest pain  HPI: [name] [name] is a 80 y.o. female  Who comes from a SNF- history from [facility] includes: intermittent left, inferior chest pain that was non-radiating. Staff gave pt 1 nitro and 324 of ASA at 9:30 this morning with relief per patient. Today pt also had episode of dizziness and fell on bottom at the SNF. Staff assisted patient- with no complaints or injury noted. At SNF Pt SpO2 noted to be 60%, patient was noted to be pale & diaphoretic and "not feeling well". Pt placed on 5L [place] by staff SpO2 increased to 100%. Upon EMS arrival pt appeared in NAD, lung sounds clear. Pt denies CP, SOB, and any discomfort.  Staff also reports a HR of 47 (pacemaker interrogated and HR lowest was 60)  Here in ER, patient admits CP but has had NO CP since given nitro at SNF.  She is <95 O2 on room air as well.  Initially labs showed Cr elevated from 2014 baseline Last echo 2014 showed: ejection fraction was in the range of 50% to 55%  Review of Systems:  Unable to do full ROS- dementia    Past Medical History  Diagnosis Date  . Acid reflux   . Seizures   . Dyslipidemia   . LBBB (left bundle branch block)   . Confusion   . Pacemaker   . Fall at nursing home 03/29/2013  . Type II diabetes mellitus   . Myocardial infarction     "long time ago" (03/31/2013)  . Exertional shortness of breath     "once in awhile" (03/31/2013)  . Anxiety   . Multi-infarct dementia   . Acute pulmonary edema   . Dementia     significant  . Gait disorder     Secondary to stroke  . Stroke     denies residual 03/31/2013  . Sacral pain   . Syncope     Likely secondary to seizure  . Hyperlipidemia   . Cerebrovascular disease     With Hx of stroke  . COPD (chronic obstructive pulmonary disease)   . Depressive  disorder, not elsewhere classified   . Peripheral neuropathy   . Cerebral atherosclerosis   . Chronic kidney disease, stage III (moderate)   . Diabetic peripheral neuropathy associated with type 2 diabetes mellitus   . Diabetic nephropathy   . Coronary artery disease 2003    60-75%D2, 50% D1, 25% left circ, 20% LAD, normal LVF  . Coronary atherosclerosis of native coronary artery   . Essential hypertension    Past Surgical History  Procedure Laterality Date  . Cholecystectomy    . Vaginal hysterectomy    . Appendectomy    . Iridectomy    . Cardiac catheterization    . Eye surgery      cataracts/ iridectomy  . Tonsillectomy and adenoidectomy    . Mastoidectomy Right   . Cataract extraction w/ intraocular lens  implant, bilateral    . Dilation and curettage of uterus    . Tubal ligation    . Tympanomastoidectomy Right 1984  . Insert / replace / remove pacemaker  06/20/2012   Social History:  reports that she has never smoked. She has never used smokeless tobacco. She reports that she does not drink alcohol or use illicit drugs.  Allergies  Allergen Reactions  .  Baycol [Cerivastatin] Other (See Comments)    Depression  . Neurontin [Gabapentin] Other (See Comments)    Anger & aggressiveness   . Vitamin D Analogs Diarrhea and Nausea And Vomiting    ONLY HIGH DOSE VITAMIN D    No family history on file.   Prior to Admission medications   Medication Sig Start Date End Date Taking? Authorizing Provider  Alcaftadine (LASTACAFT) 0.25 % SOLN Place 1 drop into both eyes daily.    Yes Historical Provider, MD  ALPRAZolam (XANAX) 0.25 MG tablet Take 0.125 mg by mouth every 8 (eight) hours as needed for anxiety.   Yes Historical Provider, MD  aspirin EC 81 MG EC tablet Take 1 tablet (81 mg total) by mouth daily. 04/21/13  Yes Marden Nobleobert Gates, MD  calcium carbonate (TUMS - DOSED IN MG ELEMENTAL CALCIUM) 500 MG chewable tablet Chew 2 tablets by mouth every 2 (two) hours as needed (dydpepsia).    Yes Historical Provider, MD  cholecalciferol (VITAMIN D) 1000 UNITS tablet Take 1,000 Units by mouth 2 (two) times daily.   Yes Historical Provider, MD  furosemide (LASIX) 40 MG tablet Take 40 mg by mouth every Monday, Wednesday, and Friday.   Yes Historical Provider, MD  insulin glargine (LANTUS) 100 UNIT/ML injection Inject 7-10 Units into the skin 2 (two) times daily. 7 units in the morning 0630 and 10 units in the evening 1830.   Yes Historical Provider, MD  isosorbide mononitrate (IMDUR) 60 MG 24 hr tablet Take 60 mg by mouth daily.   Yes Historical Provider, MD  levETIRAcetam (KEPPRA) 500 MG tablet Take 1 tablet (500 mg total) by mouth every 12 (twelve) hours. 06/16/13  Yes Marden Nobleobert Gates, MD  metoprolol succinate (TOPROL-XL) 25 MG 24 hr tablet Take 50 mg by mouth 2 (two) times daily.    Yes Historical Provider, MD  naphazoline-pheniramine (NAPHCON-A) 0.025-0.3 % ophthalmic solution Place 1 drop into both eyes daily. 1 drop in each eye daily as needed for eye itching 05/29/13  Yes Marden Nobleobert Gates, MD  nitroGLYCERIN (NITROSTAT) 0.4 MG SL tablet Place 0.4 mg under the tongue every 5 (five) minutes x 3 doses as needed. For chest pain   Yes Historical Provider, MD  pantoprazole (PROTONIX) 40 MG tablet Take 40 mg by mouth daily.   Yes Historical Provider, MD  promethazine (PHENERGAN) 25 MG/ML injection Inject 25 mg into the muscle every 6 (six) hours as needed for nausea or vomiting.   Yes Historical Provider, MD  telmisartan (MICARDIS) 80 MG tablet Take 80 mg by mouth daily.   Yes Historical Provider, MD  insulin aspart (NOVOLOG) 100 UNIT/ML injection Inject 0-10 Units into the skin 3 (three) times daily with meals. Blood sugar under 150, give 0 units; 150-200 give 2 units; 201-250 give 4 units; 251-300 give 6 units, 301-350 give 8 units, and 351--400 give 10 units.  Above 400, call MD.    Historical Provider, MD   Physical Exam: Filed Vitals:   03/19/14 1530  BP: 137/55  Pulse: 60  Temp:   Resp: 15     BP 137/55  Pulse 60  Temp(Src) 97.8 F (36.6 C) (Oral)  Resp 15  Ht 5\' 4"  (1.626 m)  Wt 68.493 kg (151 lb)  BMI 25.91 kg/m2  SpO2 100%  General:  Appears calm and comfortable, NAD Eyes: PERRL, normal lids, irises & conjunctiva ENT: grossly normal hearing, lips & tongue Neck: no LAD, masses or thyromegaly Cardiovascular: RRR, no m/r/g. No LE edema. Respiratory: CTA bilaterally, no w/r/r.  Normal respiratory effort. Abdomen: soft, ntnd Skin: no rash or induration seen on limited exam Musculoskeletal: grossly normal tone BUE/BLE Psychiatric: grossly normal mood and affect, speech fluent and appropriate Neurologic: grossly non-focal.          Labs on Admission:  Basic Metabolic Panel:  Recent Labs Lab 03/19/14 1150  NA 139  K 5.3  CL 101  CO2 21  GLUCOSE 127*  BUN 55*  CREATININE 2.14*  CALCIUM 9.3   Liver Function Tests:  Recent Labs Lab 03/19/14 1150  AST 21  ALT 16  ALKPHOS 171*  BILITOT 0.4  PROT 7.5  ALBUMIN 3.6   No results found for this basename: LIPASE, AMYLASE,  in the last 168 hours No results found for this basename: AMMONIA,  in the last 168 hours CBC:  Recent Labs Lab 03/19/14 1150  WBC 6.8  HGB 10.2*  HCT 30.9*  MCV 98.1  PLT 209   Cardiac Enzymes: No results found for this basename: CKTOTAL, CKMB, CKMBINDEX, TROPONINI,  in the last 168 hours  BNP (last 3 results)  Recent Labs  04/18/13 0511 04/20/13 0430 03/19/14 1150  PROBNP 6172.0* 3588.0* 423.2   CBG: No results found for this basename: GLUCAP,  in the last 168 hours  Radiological Exams on Admission: Dg Chest 2 View  03/19/2014   CLINICAL DATA:  Chest pain  EXAM: CHEST  2 VIEW  COMPARISON:  04/20/2013  FINDINGS: A pacing device is again seen. The lungs are well aerated bilaterally. No focal infiltrate is noted. No acute bony abnormality is seen.  IMPRESSION: No active cardiopulmonary disease.   Electronically Signed   By: Alcide Clever M.D.   On: 03/19/2014 13:31    Ct Head Wo Contrast  03/19/2014   CLINICAL DATA:  Syncopal episode  EXAM: CT HEAD WITHOUT CONTRAST  TECHNIQUE: Contiguous axial images were obtained from the base of the skull through the vertex without intravenous contrast.  COMPARISON:  CT HEAD W/O CM dated 06/15/2013  FINDINGS: No acute intracranial hemorrhage. No focal mass lesion. No CT evidence of acute infarction. No midline shift or mass effect. No hydrocephalus. Basilar cisterns are patent.  Remote infarction within the medial left occipital lobe and high right frontal parietal lobes. Associated encephalomalacia and ventricular dilatation with these remote infarctions.  Small fluid level in the right maxillary sinus. Left maxillary sinus mucosal thickening. Prior right mastoid surgery.  IMPRESSION: 1. No acute intracranial findings. 2. Remote cerebral infarctions. 3. Sinus inflammation potential right maxillary sinusitis.   Electronically Signed   By: Genevive Bi M.D.   On: 03/19/2014 13:04    EKG: Independently reviewed. LBBB-old, paced  Assessment/Plan Active Problems:   Diabetes mellitus   Acid reflux disease   Renal failure   Chest pain   1. Chest pain- cycle CE, tele bed, defer echo to PCP (last 2014) and results of troponin 2. AKI- gentle IVF with labs in AM, hold lasix 3. Dementia- appears to be at baseline mentally- able to provide some history 4. Acid reflux- GI cocktail ordered 5. DM- SSI and lantus continued  LM for Dr Kevan Ny  Code Status: DNR Family Communication: LM for daughter Disposition Plan: obs- back to [facility] soon  Time spent: 65 min  Joseph Art [facility] Pager [phone]

## 2014-03-19 NOTE — ED Notes (Signed)
Spoke to GreenLori LPN from [facility]. Sts 3 days agopt c/o left arm/elbow pain. NP x-rayed it and no fractures or acute processes. Today RN found patient had gotten self off ground and on bed appx 9:30 RN saw patient diaphoretic and pale sts HR was 47, SpO2- 80% place patient on 2L of O2- pt c/o chest pressure and gave 1 nitro and 324 ASA and patient std she felt better. patient O2 sats 96%. Patient walked to bathroom again and SpO2 60% HR 47 pt placed on 5L spO2- 100%. EMS arrived.

## 2014-03-19 NOTE — ED Notes (Signed)
Patient pacemaker interrogated using [employer]- records do not indicate pacemaker service, and patient & [facility] do not know.

## 2014-03-19 NOTE — ED Provider Notes (Signed)
CSN: [id number]     Arrival date & time 03/19/14  1128 History   First MD Initiated Contact with Patient 03/19/14 1130     Chief Complaint  Patient presents with  . hypoxia   . Chest Pain     (Consider location/radiation/quality/duration/timing/severity/associated sxs/prior Treatment) Patient is a 80 y.o. female presenting with chest pain. The history is provided by the patient.  Chest Pain  [facility] Several reasons one was intermittent left chest pain that was nonradiating patient does given nitroglycerin and aspirin at 9:30 this morning without relief according to nursing home. Patient denies any chest pain now states she did have some much earlier. In addition there was an episode of dizziness and patient fell not clear whether there was a loss of consciousness.. The fall was unwitnessed O. Patient has no complaints. Patient's oxygen sats there were noted to be 60% patient was noted to be pale and diaphoretic and not feeling well started on 5 L in nasal cannula oxygen she came up 100%. Here patient off oxygen has been satting above 90%. Patient in no acute distress. Smell denies chest pain shortness of breath or any discomfort.  Past Medical History  Diagnosis Date  . Acid reflux   . Seizures   . Dyslipidemia   . LBBB (left bundle branch block)   . Confusion   . Pacemaker   . Fall at nursing home 03/29/2013  . Type II diabetes mellitus   . Myocardial infarction     "long time ago" (03/31/2013)  . Exertional shortness of breath     "once in awhile" (03/31/2013)  . Anxiety   . Multi-infarct dementia   . Acute pulmonary edema   . Dementia     significant  . Gait disorder     Secondary to stroke  . Stroke     denies residual 03/31/2013  . Sacral pain   . Syncope     Likely secondary to seizure  . Hyperlipidemia   . Cerebrovascular disease     With Hx of stroke  . COPD (chronic obstructive pulmonary disease)   . Depressive disorder, not  elsewhere classified   . Peripheral neuropathy   . Cerebral atherosclerosis   . Chronic kidney disease, stage III (moderate)   . Diabetic peripheral neuropathy associated with type 2 diabetes mellitus   . Diabetic nephropathy   . Coronary artery disease 2003    60-75%D2, 50% D1, 25% left circ, 20% LAD, normal LVF  . Coronary atherosclerosis of native coronary artery   . Essential hypertension    Past Surgical History  Procedure Laterality Date  . Cholecystectomy    . Vaginal hysterectomy    . Appendectomy    . Iridectomy    . Cardiac catheterization    . Eye surgery      cataracts/ iridectomy  . Tonsillectomy and adenoidectomy    . Mastoidectomy Right   . Cataract extraction w/ intraocular lens  implant, bilateral    . Dilation and curettage of uterus    . Tubal ligation    . Tympanomastoidectomy Right 1984  . Insert / replace / remove pacemaker  06/20/2012   No family history on file. History  Substance Use Topics  . Smoking status: Never Smoker   . Smokeless tobacco: Never Used  . Alcohol Use: No   OB History   Grav Para Term Preterm Abortions TAB SAB Ect Mult Living  Review of Systems  Unable to perform ROS Cardiovascular: Positive for chest pain.   patient very alert but has a history of dementia not sure she's able to remember recent events appears that she has not.    Allergies  Baycol; Neurontin; and Vitamin d analogs  Home Medications   Prior to Admission medications   Medication Sig Start Date End Date Taking? Authorizing Provider  Alcaftadine (LASTACAFT) 0.25 % SOLN Place 1 drop into both eyes daily.    Yes Historical Provider, MD  ALPRAZolam (XANAX) 0.25 MG tablet Take 0.125 mg by mouth every 8 (eight) hours as needed for anxiety.   Yes Historical Provider, MD  aspirin EC 81 MG EC tablet Take 1 tablet (81 mg total) by mouth daily. 04/21/13  Yes Marden Noble, MD  calcium carbonate (TUMS - DOSED IN MG ELEMENTAL CALCIUM) 500 MG chewable  tablet Chew 2 tablets by mouth every 2 (two) hours as needed (dydpepsia).   Yes Historical Provider, MD  cholecalciferol (VITAMIN D) 1000 UNITS tablet Take 1,000 Units by mouth 2 (two) times daily.   Yes Historical Provider, MD  furosemide (LASIX) 40 MG tablet Take 40 mg by mouth every Monday, Wednesday, and Friday.   Yes Historical Provider, MD  insulin glargine (LANTUS) 100 UNIT/ML injection Inject 7-10 Units into the skin 2 (two) times daily. 7 units in the morning 0630 and 10 units in the evening 1830.   Yes Historical Provider, MD  isosorbide mononitrate (IMDUR) 60 MG 24 hr tablet Take 60 mg by mouth daily.   Yes Historical Provider, MD  levETIRAcetam (KEPPRA) 500 MG tablet Take 1 tablet (500 mg total) by mouth every 12 (twelve) hours. 06/16/13  Yes Marden Noble, MD  metoprolol succinate (TOPROL-XL) 25 MG 24 hr tablet Take 50 mg by mouth 2 (two) times daily.    Yes Historical Provider, MD  naphazoline-pheniramine (NAPHCON-A) 0.025-0.3 % ophthalmic solution Place 1 drop into both eyes daily. 1 drop in each eye daily as needed for eye itching 05/29/13  Yes Marden Noble, MD  nitroGLYCERIN (NITROSTAT) 0.4 MG SL tablet Place 0.4 mg under the tongue every 5 (five) minutes x 3 doses as needed. For chest pain   Yes Historical Provider, MD  pantoprazole (PROTONIX) 40 MG tablet Take 40 mg by mouth daily.   Yes Historical Provider, MD  promethazine (PHENERGAN) 25 MG/ML injection Inject 25 mg into the muscle every 6 (six) hours as needed for nausea or vomiting.   Yes Historical Provider, MD  telmisartan (MICARDIS) 80 MG tablet Take 80 mg by mouth daily.   Yes Historical Provider, MD  insulin aspart (NOVOLOG) 100 UNIT/ML injection Inject 0-10 Units into the skin 3 (three) times daily with meals. Blood sugar under 150, give 0 units; 150-200 give 2 units; 201-250 give 4 units; 251-300 give 6 units, 301-350 give 8 units, and 351--400 give 10 units.  Above 400, call MD.    Historical Provider, MD   BP 116/51  Pulse  59  Temp(Src) 97.8 F (36.6 C) (Oral)  Resp 14  Ht 5\' 4"  (1.626 m)  Wt 151 lb (68.493 kg)  BMI 25.91 kg/m2  SpO2 97% Physical Exam  Nursing note and vitals reviewed. Constitutional: She appears well-developed and well-nourished. No distress.  HENT:  Head: Normocephalic and atraumatic.  Mouth/Throat: Oropharynx is clear and moist.  Eyes: Conjunctivae and EOM are normal. Pupils are equal, round, and reactive to light.  Neck: Normal range of motion. Neck supple.  Cardiovascular: Normal rate, regular rhythm and normal  heart sounds.   No murmur heard. Pulmonary/Chest: Effort normal. No respiratory distress.  Abdominal: Soft. Bowel sounds are normal. There is no tenderness.  Musculoskeletal: Normal range of motion.  Neurological: She is alert. No cranial nerve deficit. She exhibits normal muscle tone. Coordination normal.  Skin: Skin is warm. No rash noted.    ED Course  Procedures (including critical care time) Labs Review Labs Reviewed  CBC - Abnormal; Notable for the following:    RBC 3.15 (*)    Hemoglobin 10.2 (*)    HCT 30.9 (*)    All other components within normal limits  COMPREHENSIVE METABOLIC PANEL - Abnormal; Notable for the following:    Glucose, Bld 127 (*)    BUN 55 (*)    Creatinine, Ser 2.14 (*)    Alkaline Phosphatase 171 (*)    GFR calc non Af Amer 20 (*)    GFR calc Af Amer 23 (*)    All other components within normal limits  PRO B NATRIURETIC PEPTIDE  PROTIME-INR  URINALYSIS, ROUTINE W REFLEX MICROSCOPIC  I-STAT TROPOININ, ED  I-STAT TROPOININ, ED   Results for orders placed during the hospital encounter of 03/19/14  CBC      Result Value Ref Range   WBC 6.8  4.0 - 10.5 K/uL   RBC 3.15 (*) 3.87 - 5.11 MIL/uL   Hemoglobin 10.2 (*) 12.0 - 15.0 g/dL   HCT 16.130.9 (*) 09.636.0 - 04.546.0 %   MCV 98.1  78.0 - 100.0 fL   MCH 32.4  26.0 - 34.0 pg   MCHC 33.0  30.0 - 36.0 g/dL   RDW 40.912.8  81.111.5 - 91.415.5 %   Platelets 209  150 - 400 K/uL  COMPREHENSIVE METABOLIC  PANEL      Result Value Ref Range   Sodium 139  137 - 147 mEq/L   Potassium 5.3  3.7 - 5.3 mEq/L   Chloride 101  96 - 112 mEq/L   CO2 21  19 - 32 mEq/L   Glucose, Bld 127 (*) 70 - 99 mg/dL   BUN 55 (*) 6 - 23 mg/dL   Creatinine, Ser 7.822.14 (*) 0.50 - 1.10 mg/dL   Calcium 9.3  8.4 - 95.610.5 mg/dL   Total Protein 7.5  6.0 - 8.3 g/dL   Albumin 3.6  3.5 - 5.2 g/dL   AST 21  0 - 37 U/L   ALT 16  0 - 35 U/L   Alkaline Phosphatase 171 (*) 39 - 117 U/L   Total Bilirubin 0.4  0.3 - 1.2 mg/dL   GFR calc non Af Amer 20 (*) >90 mL/min   GFR calc Af Amer 23 (*) >90 mL/min  PRO B NATRIURETIC PEPTIDE      Result Value Ref Range   Pro B Natriuretic peptide (BNP) 423.2  0 - 450 pg/mL  PROTIME-INR      Result Value Ref Range   Prothrombin Time 12.6  11.6 - 15.2 seconds   INR 0.96  0.00 - 1.49  URINALYSIS, ROUTINE W REFLEX MICROSCOPIC      Result Value Ref Range   Color, Urine YELLOW  YELLOW   APPearance CLEAR  CLEAR   Specific Gravity, Urine 1.016  1.005 - 1.030   pH 5.0  5.0 - 8.0   Glucose, UA NEGATIVE  NEGATIVE mg/dL   Hgb urine dipstick NEGATIVE  NEGATIVE   Bilirubin Urine NEGATIVE  NEGATIVE   Ketones, ur NEGATIVE  NEGATIVE mg/dL   Protein, ur NEGATIVE  NEGATIVE  mg/dL   Urobilinogen, UA 0.2  0.0 - 1.0 mg/dL   Nitrite NEGATIVE  NEGATIVE   Leukocytes, UA NEGATIVE  NEGATIVE  I-STAT TROPOININ, ED      Result Value Ref Range   Troponin i, poc 0.02  0.00 - 0.08 ng/mL   Comment 3           I-STAT TROPOININ, ED      Result Value Ref Range   Troponin i, poc 0.02  0.00 - 0.08 ng/mL   Comment 3              Imaging Review Dg Chest 2 View  03/19/2014   CLINICAL DATA:  Chest pain  EXAM: CHEST  2 VIEW  COMPARISON:  04/20/2013  FINDINGS: A pacing device is again seen. The lungs are well aerated bilaterally. No focal infiltrate is noted. No acute bony abnormality is seen.  IMPRESSION: No active cardiopulmonary disease.   Electronically Signed   By: Alcide Clever M.D.   On: 03/19/2014 13:31   Ct  Head Wo Contrast  03/19/2014   CLINICAL DATA:  Syncopal episode  EXAM: CT HEAD WITHOUT CONTRAST  TECHNIQUE: Contiguous axial images were obtained from the base of the skull through the vertex without intravenous contrast.  COMPARISON:  CT HEAD W/O CM dated 06/15/2013  FINDINGS: No acute intracranial hemorrhage. No focal mass lesion. No CT evidence of acute infarction. No midline shift or mass effect. No hydrocephalus. Basilar cisterns are patent.  Remote infarction within the medial left occipital lobe and high right frontal parietal lobes. Associated encephalomalacia and ventricular dilatation with these remote infarctions.  Small fluid level in the right maxillary sinus. Left maxillary sinus mucosal thickening. Prior right mastoid surgery.  IMPRESSION: 1. No acute intracranial findings. 2. Remote cerebral infarctions. 3. Sinus inflammation potential right maxillary sinusitis.   Electronically Signed   By: Genevive Bi M.D.   On: 03/19/2014 13:04     EKG Interpretation   Date/Time:  Thursday March 19 2014 11:33:41 EDT Ventricular Rate:  59 PR Interval:  211 QRS Duration: 127 QT Interval:  429 QTC Calculation: 425 R Axis:   -15 Text Interpretation:  Sinus rhythm Left bundle branch block Electronic  atrial pacemaker Confirmed by Travanti Mcmanus  MD, Lorin Picket [phone]) on 03/19/2014  12:27:29 PM      MDM   Final diagnoses:  Renal failure  Fall    The patient was confusing picture for what happened actually it [facility]. Was complained of some chest pain. Supposedly given aspirin and nitroglycerin but has progressed and was given after pain had resolved. Patient's troponin T are negative x2.. There definitely was a fall incident whether there was any loss of consciousness is not clear. Patient's pacemaker has been interrogated his been no arrhythmias. Patient complains of nothing hurting. Head CT was negative. Chest x-ray negative patient's lab workup without significant findings other than  a marked change in her renal function. BUN and creatinine is almost doubled compared to previous readings. Potassium is normal. Discussed with hospitalist team they will admit for telemetry observation and slow hydration. Thinking that it may be a prerenal type situation.  Patient has been off of oxygen for a period of time here in satting above 90%. No acute distress. No evidence of arrhythmia based on interrogation no evidence of head trauma. Chest x-rays negative no evidence of acute cardiac event with troponins x2 negative.   Shelda Jakes, MD 03/19/14 [phone]

## 2014-03-19 NOTE — ED Notes (Signed)
Per EMS pt from [facility] c/o intermittent left, inferior chest pain that was non-radiating. Staff gave pt 1 nitro and 324 of ASA at 9:30 this morning without relief. Today pt had episode of dizziness and fell on bottom. Staff assisted patient- with no complaints or injury noted. Pt SpO2 noted to be 60%, pale & diaphoretic and "not feeling well". Pt placed on 5L [place] by staff SpO2 increased to 100%. Upon EMS arrival pt appeared in NAD, lung sounds clear. Pt denies CP, SOB, and any discomfort.    SpO2 88%/2L , 94%/3L, 100%/ 4L   Initial BP 156/95, last BP 122/85

## 2014-03-19 NOTE — ED Notes (Addendum)
Scott from [employer] called- dual pacemaker. No episodes. Atrially paced 93% and ventricularlly paced 4%. A few PVCs, pacing rate 60bpm and upper rate is 120bpm- has maintained between since November. sts HR was never below 60bpm   Dr. Saul FordyceZacowski made aware.

## 2014-03-20 LAB — BASIC METABOLIC PANEL
BUN: 46 mg/dL — AB (ref 6–23)
CALCIUM: 9.1 mg/dL (ref 8.4–10.5)
CHLORIDE: 108 meq/L (ref 96–112)
CO2: 20 mEq/L (ref 19–32)
CREATININE: 1.63 mg/dL — AB (ref 0.50–1.10)
GFR calc non Af Amer: 28 mL/min — ABNORMAL LOW (ref >90)
GFR, EST AFRICAN AMERICAN: 33 mL/min — AB (ref >90)
Glucose, Bld: 104 mg/dL — ABNORMAL HIGH (ref 70–99)
Potassium: 5 mEq/L (ref 3.7–5.3)
Sodium: 141 mEq/L (ref 137–147)

## 2014-03-20 LAB — CBC
HEMATOCRIT: 27.7 % — AB (ref 36.0–46.0)
Hemoglobin: 9.2 g/dL — ABNORMAL LOW (ref 12.0–15.0)
MCH: 33 pg (ref 26.0–34.0)
MCHC: 33.2 g/dL (ref 30.0–36.0)
MCV: 99.3 fL (ref 78.0–100.0)
PLATELETS: 162 10*3/uL (ref 150–400)
RBC: 2.79 MIL/uL — ABNORMAL LOW (ref 3.87–5.11)
RDW: 12.6 % (ref 11.5–15.5)
WBC: 4.6 10*3/uL (ref 4.0–10.5)

## 2014-03-20 LAB — TROPONIN I: Troponin I: <0.3 ng/mL (ref <0.30)

## 2014-03-20 LAB — GLUCOSE, CAPILLARY
GLUCOSE-CAPILLARY: 176 mg/dL — AB (ref 70–99)
Glucose-Capillary: 99 mg/dL (ref 70–99)

## 2014-03-20 NOTE — Progress Notes (Signed)
[facility]

## 2014-03-20 NOTE — Progress Notes (Signed)
Pt to go back to [facility] in [place] today.

## 2014-03-20 NOTE — Progress Notes (Signed)
[facility]

## 2014-03-20 NOTE — Discharge Summary (Signed)
Physician Discharge Summary  NAME:[name] [name]  ZOX:[id number]  DOB: 29-Apr-1928   Admit date: 03/19/2014 Discharge date: 03/20/2014  Discharge Diagnoses:  Active Problems:   Diabetes mellitus - chronic and stable   Acid reflux disease - aware   Renal failure - chronic and stable   Chest pain - etiology unknown.  No myocardial injury.  Possibly esophageal spasm.  Will followup at nursing home   History of vascular dementia, multi-infarct   History of diastolic congestive heart failure   History of seizure disorder   History of permanent pacemaker   Anxiety/depression   History of dyslipidemia   Coronary artery disease with MI 03/31/2013   No CODE BLUE - DO NOT RESUSCITATE/DO NOT INTUBATE  Discharge Physical Exam:  General Appearance: Alert, cooperative, no distress, appears stated age  Weight change:  No intake or output data in the 24 hours ending 03/20/14 0759 Filed Vitals:   03/19/14 1600 03/19/14 1745 03/19/14 2107 03/20/14 0458  BP: 130/44 149/52 121/53 107/44  Pulse: 59 59 60 61  Temp:  97.9 F (36.6 C) 98.5 F (36.9 C) 98.5 F (36.9 C)  TempSrc:  Oral Oral Oral  Resp: 11 20 18 18   Height:  5\' 2"  (1.575 m) 5\' 2"  (1.575 m)   Weight:  154 lb 8.7 oz (70.1 kg) 154 lb 8 oz (70.081 kg)   SpO2: 100% 100% 100% 97%    Lungs: Clear to auscultation bilaterally, respirations unlabored Heart: Regular rate and rhythm, S1 and S2 normal, no murmur, rub or gallop Abdomen: Soft, non-tender, bowel sounds active all four quadrants, no masses, no organomegaly Extremities: Extremities normal, atraumatic, no cyanosis or edema Neuro: Oriented x2, name and place.  Not date or time  Discharge Condition: Stable  Hospital Course: [name] [name] presented yesterday to [facility] skilled nursing facility having developed chest pain and hypoxemia and apparently hypotension and bradycardia.  She had a fall at the nursing home.  On presentation to the ER she was fine.  Vital signs were stable and O2  saturations were normal on room air and chest pain has resolved.  She did receive nitroglycerin at the nursing home.  She's had 3 negative troponins overnight and is comfortable and back to baseline.  She is not tachycardic or short of breath.  O2 saturations are excellent.  Not sure the etiology of this particular spell but will continue to follow closely at the nursing home.  She is a DO NOT RESUSCITATE/DO NOT INTUBATE.  We'll transfer back to the nursing home by ambulance  Things to follow up in the outpatient setting: Monitor for recurrent chest pain or shortness of breath.  Continue PPI therapy  Consults: Treatment Team:  Marden Nobleobert Amarrah Meinhart, MD  Disposition: 03-Skilled Nursing Facility  Discharge Orders   Future Orders Complete By Expires   (HEART FAILURE PATIENTS) Call MD:  Anytime you have any of the following symptoms: 1) 3 pound weight gain in 24 hours or 5 pounds in 1 week 2) shortness of breath, with or without a dry hacking cough 3) swelling in the hands, feet or stomach 4) if you have to sleep on extra pillows at night in order to breathe.  As directed    Call MD for:  difficulty breathing, headache or visual disturbances  As directed    Call MD for:  temperature >100.4  As directed    Diet - low sodium heart healthy  As directed    Increase activity slowly  As directed  Medication List         ALPRAZolam 0.25 MG tablet  Commonly known as:  XANAX  Take 0.125 mg by mouth every 8 (eight) hours as needed for anxiety.     aspirin 81 MG EC tablet  Take 1 tablet (81 mg total) by mouth daily.     calcium carbonate 500 MG chewable tablet  Commonly known as:  TUMS - dosed in mg elemental calcium  Chew 2 tablets by mouth every 2 (two) hours as needed (dydpepsia).     cholecalciferol 1000 UNITS tablet  Commonly known as:  VITAMIN D  Take 1,000 Units by mouth 2 (two) times daily.     furosemide 40 MG tablet  Commonly known as:  LASIX  Take 40 mg by mouth every Monday,  Wednesday, and Friday.     insulin aspart 100 UNIT/ML injection  Commonly known as:  novoLOG  Inject 0-10 Units into the skin 3 (three) times daily with meals. Blood sugar under 150, give 0 units; 150-200 give 2 units; 201-250 give 4 units; 251-300 give 6 units, 301-350 give 8 units, and 351--400 give 10 units.  Above 400, call MD.     insulin glargine 100 UNIT/ML injection  Commonly known as:  LANTUS  Inject 7-10 Units into the skin 2 (two) times daily. 7 units in the morning 0630 and 10 units in the evening 1830.     isosorbide mononitrate 60 MG 24 hr tablet  Commonly known as:  IMDUR  Take 60 mg by mouth daily.     LASTACAFT 0.25 % Soln  Generic drug:  Alcaftadine  Place 1 drop into both eyes daily.     levETIRAcetam 500 MG tablet  Commonly known as:  KEPPRA  Take 1 tablet (500 mg total) by mouth every 12 (twelve) hours.     metoprolol succinate 25 MG 24 hr tablet  Commonly known as:  TOPROL-XL  Take 50 mg by mouth 2 (two) times daily.     naphazoline-pheniramine 0.025-0.3 % ophthalmic solution  Commonly known as:  NAPHCON-A  Place 1 drop into both eyes daily. 1 drop in each eye daily as needed for eye itching     nitroGLYCERIN 0.4 MG SL tablet  Commonly known as:  NITROSTAT  Place 0.4 mg under the tongue every 5 (five) minutes x 3 doses as needed. For chest pain     pantoprazole 40 MG tablet  Commonly known as:  PROTONIX  Take 40 mg by mouth daily.     promethazine 25 MG/ML injection  Commonly known as:  PHENERGAN  Inject 25 mg into the muscle every 6 (six) hours as needed for nausea or vomiting.     telmisartan 80 MG tablet  Commonly known as:  MICARDIS  Take 80 mg by mouth daily.         The results of significant diagnostics from this hospitalization (including imaging, microbiology, ancillary and laboratory) are listed below for reference.    Significant Diagnostic Studies: Dg Chest 2 View  03/19/2014   CLINICAL DATA:  Chest pain  EXAM: CHEST  2 VIEW   COMPARISON:  04/20/2013  FINDINGS: A pacing device is again seen. The lungs are well aerated bilaterally. No focal infiltrate is noted. No acute bony abnormality is seen.  IMPRESSION: No active cardiopulmonary disease.   Electronically Signed   By: Alcide Clever M.D.   On: 03/19/2014 13:31   Ct Head Wo Contrast  03/19/2014   CLINICAL DATA:  Syncopal episode  EXAM:  CT HEAD WITHOUT CONTRAST  TECHNIQUE: Contiguous axial images were obtained from the base of the skull through the vertex without intravenous contrast.  COMPARISON:  CT HEAD W/O CM dated 06/15/2013  FINDINGS: No acute intracranial hemorrhage. No focal mass lesion. No CT evidence of acute infarction. No midline shift or mass effect. No hydrocephalus. Basilar cisterns are patent.  Remote infarction within the medial left occipital lobe and high right frontal parietal lobes. Associated encephalomalacia and ventricular dilatation with these remote infarctions.  Small fluid level in the right maxillary sinus. Left maxillary sinus mucosal thickening. Prior right mastoid surgery.  IMPRESSION: 1. No acute intracranial findings. 2. Remote cerebral infarctions. 3. Sinus inflammation potential right maxillary sinusitis.   Electronically Signed   By: Genevive BiStewart  Edmunds M.D.   On: 03/19/2014 13:04    Microbiology: Recent Results (from the past 240 hour(s))  MRSA PCR SCREENING     Status: None   Collection Time    03/19/14  5:10 PM      Result Value Ref Range Status   MRSA by PCR NEGATIVE  NEGATIVE Final   Comment:            The GeneXpert MRSA Assay (FDA     approved for NASAL specimens     only), is one component of a     comprehensive MRSA colonization     surveillance program. It is not     intended to diagnose MRSA     infection nor to guide or     monitor treatment for     MRSA infections.     Labs: Results for orders placed during the hospital encounter of 03/19/14  MRSA PCR SCREENING      Result Value Ref Range   MRSA by PCR NEGATIVE   NEGATIVE  CBC      Result Value Ref Range   WBC 6.8  4.0 - 10.5 K/uL   RBC 3.15 (*) 3.87 - 5.11 MIL/uL   Hemoglobin 10.2 (*) 12.0 - 15.0 g/dL   HCT 13.230.9 (*) 44.036.0 - 10.246.0 %   MCV 98.1  78.0 - 100.0 fL   MCH 32.4  26.0 - 34.0 pg   MCHC 33.0  30.0 - 36.0 g/dL   RDW 72.512.8  36.611.5 - 44.015.5 %   Platelets 209  150 - 400 K/uL  COMPREHENSIVE METABOLIC PANEL      Result Value Ref Range   Sodium 139  137 - 147 mEq/L   Potassium 5.3  3.7 - 5.3 mEq/L   Chloride 101  96 - 112 mEq/L   CO2 21  19 - 32 mEq/L   Glucose, Bld 127 (*) 70 - 99 mg/dL   BUN 55 (*) 6 - 23 mg/dL   Creatinine, Ser 3.472.14 (*) 0.50 - 1.10 mg/dL   Calcium 9.3  8.4 - 42.510.5 mg/dL   Total Protein 7.5  6.0 - 8.3 g/dL   Albumin 3.6  3.5 - 5.2 g/dL   AST 21  0 - 37 U/L   ALT 16  0 - 35 U/L   Alkaline Phosphatase 171 (*) 39 - 117 U/L   Total Bilirubin 0.4  0.3 - 1.2 mg/dL   GFR calc non Af Amer 20 (*) >90 mL/min   GFR calc Af Amer 23 (*) >90 mL/min  PRO B NATRIURETIC PEPTIDE      Result Value Ref Range   Pro B Natriuretic peptide (BNP) 423.2  0 - 450 pg/mL  PROTIME-INR      Result Value  Ref Range   Prothrombin Time 12.6  11.6 - 15.2 seconds   INR 0.96  0.00 - 1.49  URINALYSIS, ROUTINE W REFLEX MICROSCOPIC      Result Value Ref Range   Color, Urine YELLOW  YELLOW   APPearance CLEAR  CLEAR   Specific Gravity, Urine 1.016  1.005 - 1.030   pH 5.0  5.0 - 8.0   Glucose, UA NEGATIVE  NEGATIVE mg/dL   Hgb urine dipstick NEGATIVE  NEGATIVE   Bilirubin Urine NEGATIVE  NEGATIVE   Ketones, ur NEGATIVE  NEGATIVE mg/dL   Protein, ur NEGATIVE  NEGATIVE mg/dL   Urobilinogen, UA 0.2  0.0 - 1.0 mg/dL   Nitrite NEGATIVE  NEGATIVE   Leukocytes, UA NEGATIVE  NEGATIVE  TROPONIN I      Result Value Ref Range   Troponin I <0.30  <0.30 ng/mL  TROPONIN I      Result Value Ref Range   Troponin I <0.30  <0.30 ng/mL  TROPONIN I      Result Value Ref Range   Troponin I <0.30  <0.30 ng/mL  CBC      Result Value Ref Range   WBC 4.6  4.0 - 10.5 K/uL    RBC 2.79 (*) 3.87 - 5.11 MIL/uL   Hemoglobin 9.2 (*) 12.0 - 15.0 g/dL   HCT 16.1 (*) 09.6 - 04.5 %   MCV 99.3  78.0 - 100.0 fL   MCH 33.0  26.0 - 34.0 pg   MCHC 33.2  30.0 - 36.0 g/dL   RDW 40.9  81.1 - 91.4 %   Platelets 162  150 - 400 K/uL  BASIC METABOLIC PANEL      Result Value Ref Range   Sodium 141  137 - 147 mEq/L   Potassium 5.0  3.7 - 5.3 mEq/L   Chloride 108  96 - 112 mEq/L   CO2 20  19 - 32 mEq/L   Glucose, Bld 104 (*) 70 - 99 mg/dL   BUN 46 (*) 6 - 23 mg/dL   Creatinine, Ser 7.82 (*) 0.50 - 1.10 mg/dL   Calcium 9.1  8.4 - 95.6 mg/dL   GFR calc non Af Amer 28 (*) >90 mL/min   GFR calc Af Amer 33 (*) >90 mL/min  GLUCOSE, CAPILLARY      Result Value Ref Range   Glucose-Capillary 145 (*) 70 - 99 mg/dL  GLUCOSE, CAPILLARY      Result Value Ref Range   Glucose-Capillary 192 (*) 70 - 99 mg/dL  GLUCOSE, CAPILLARY      Result Value Ref Range   Glucose-Capillary 99  70 - 99 mg/dL  I-STAT TROPOININ, ED      Result Value Ref Range   Troponin i, poc 0.02  0.00 - 0.08 ng/mL   Comment 3           I-STAT TROPOININ, ED      Result Value Ref Range   Troponin i, poc 0.02  0.00 - 0.08 ng/mL   Comment 3             Time coordinating discharge:  32 minutes  Signed: Pearla Dubonnet, MD 03/20/2014, 7:59 AM

## 2014-03-20 NOTE — Clinical Social Work Psychosocial (Signed)
Clinical Social Work Department BRIEF PSYCHOSOCIAL ASSESSMENT 03/20/2014  Patient:  [name] [name],[name] [name]     Account Number:  [id number]     Admit date:  03/19/2014  Clinical Social Worker:  Delmer IslamRAWFORD,Trek Kimball, LCSW  Date/Time:  03/20/2014 03:31 AM  Referred by:  Physician  Date Referred:  03/20/2014 Referred for  SNF Placement   Other Referral:   Interview type:  Patient Other interview type:   CSW also spoke with admissions director [facility] [place]    PSYCHOSOCIAL DATA Living Status:  FACILITY Admitted from facility:  [facility], [place] Level of care:  Skilled Nursing Facility Primary support name:   Primary support relationship to patient:   Degree of support available:    CURRENT CONCERNS Current Concerns  Post-Acute Placement   Other Concerns:    SOCIAL WORK ASSESSMENT / PLAN Patient admitted to hospital at 3:15 pm on 4/16 and is ready for discharge back to facility today. Patient asked CSW about facility coming to get her and was advised that they don't provide transport from hospital back to skilled facility. Patient aware that she will be transported by ambulance.   Assessment/plan status:  No Further Intervention Required Other assessment/ plan:   Information/referral to community resources:   None requested or needed at this time.    PATIENT'S/FAMILY'S RESPONSE TO PLAN OF CARE: At time of visit, patient was dressed and ready to [facility]. She came from the skilled facility and voiced her intent to return.

## 2014-03-20 NOTE — Progress Notes (Signed)
Pt left floor via stretcher by ambulance back to [facility].

## 2014-05-04 ENCOUNTER — Encounter: Payer: Self-pay | Admitting: Cardiology

## 2014-07-08 ENCOUNTER — Encounter: Payer: Self-pay | Admitting: *Deleted

## 2014-08-06 IMAGING — CR DG HIP (WITH OR WITHOUT PELVIS) 2-3V*L*
3 series · 3 of 3 positions shown · non-contrast
Comparison: None.

CLINICAL DATA: Left hip pain.

LEFT HIP - COMPLETE 2+ VIEW

[t pelvis ap]
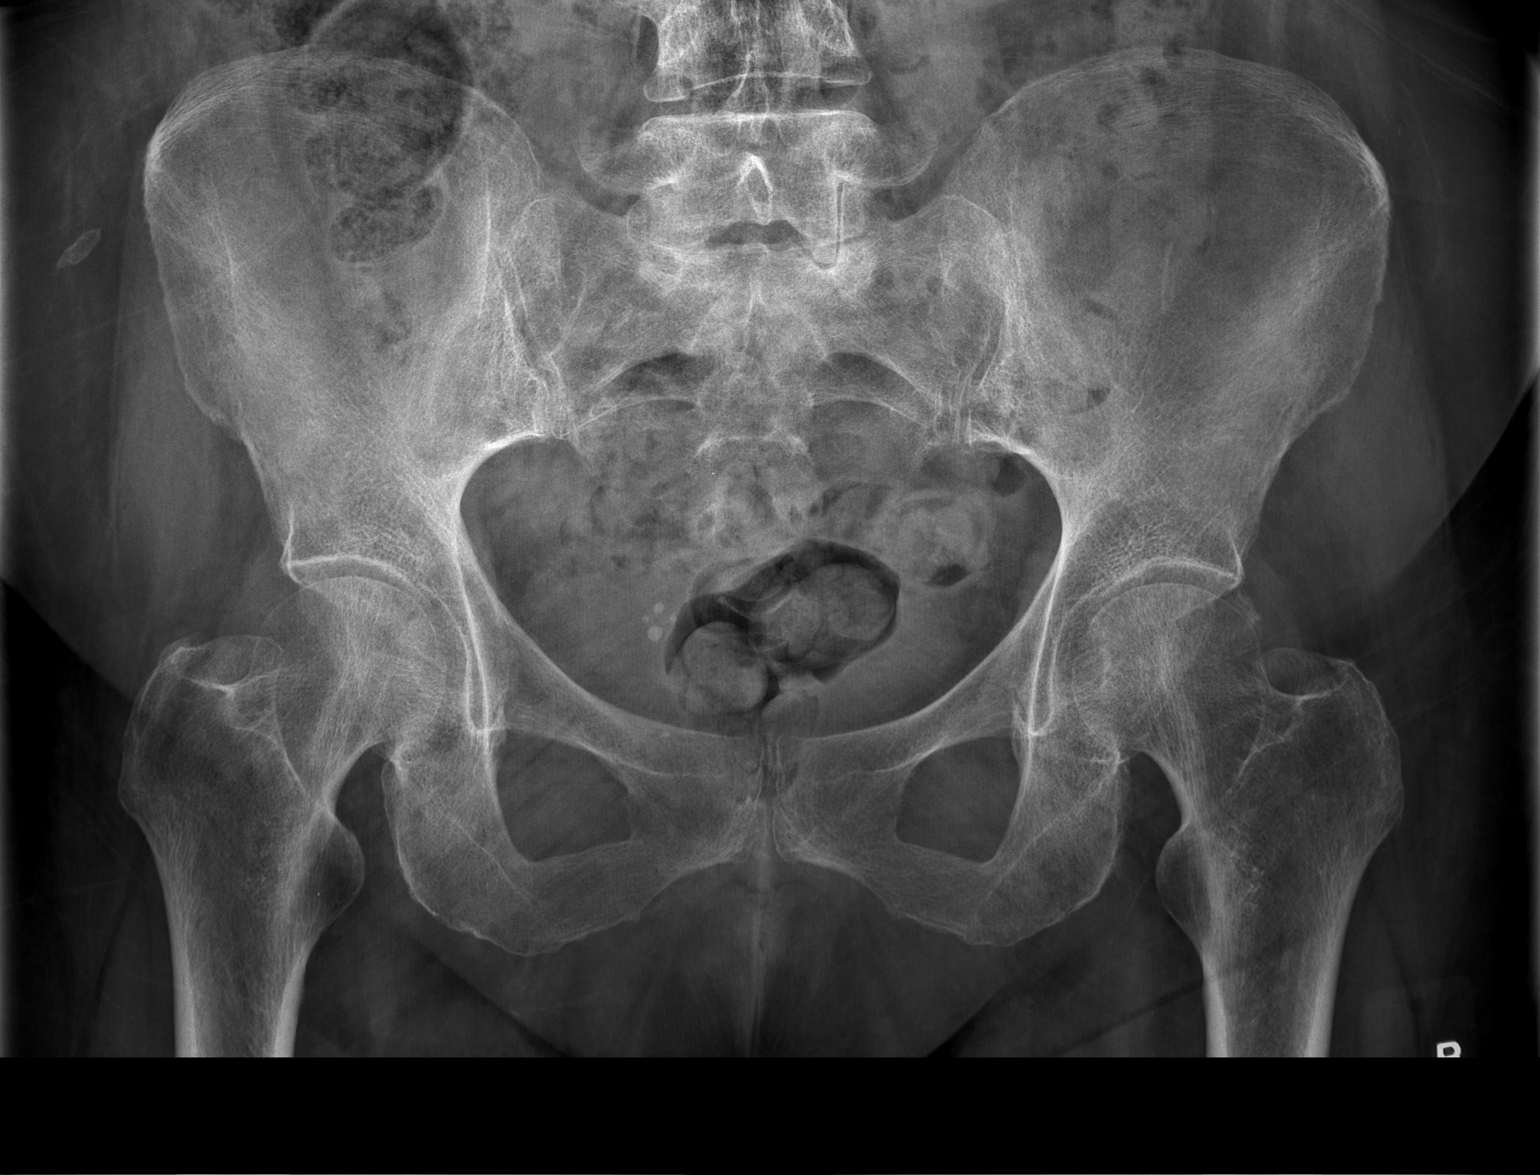

[t hip ap left]
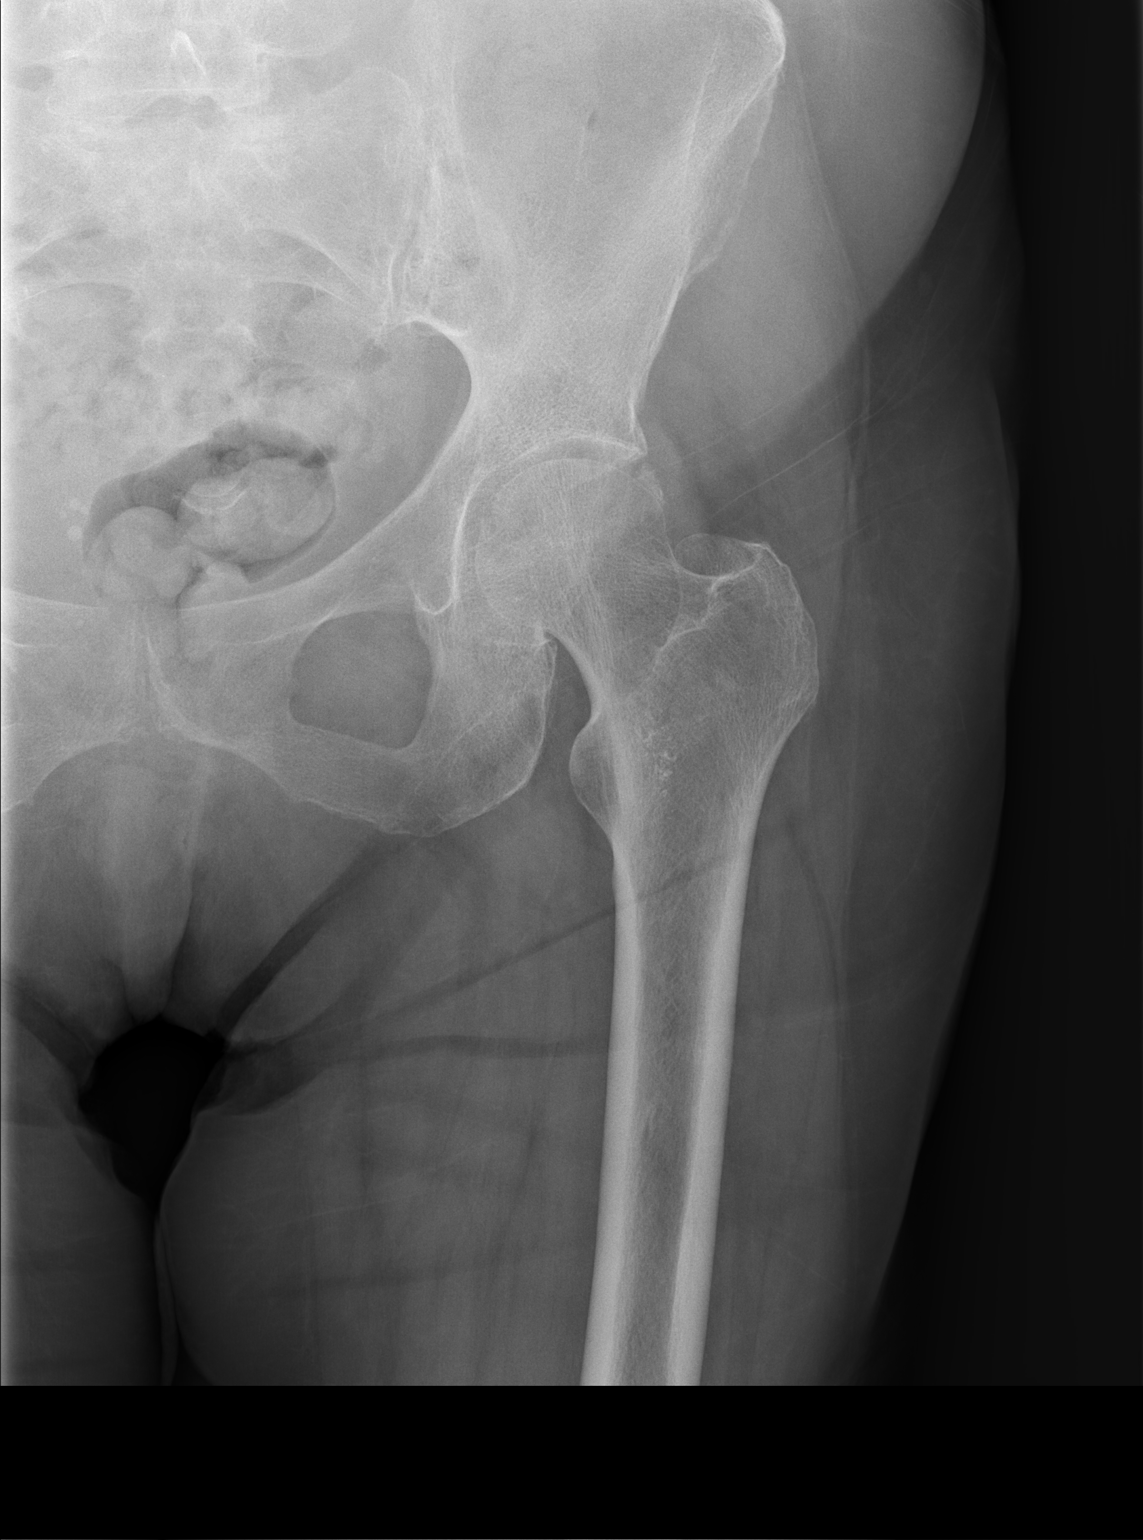

[t hip frog leg left]
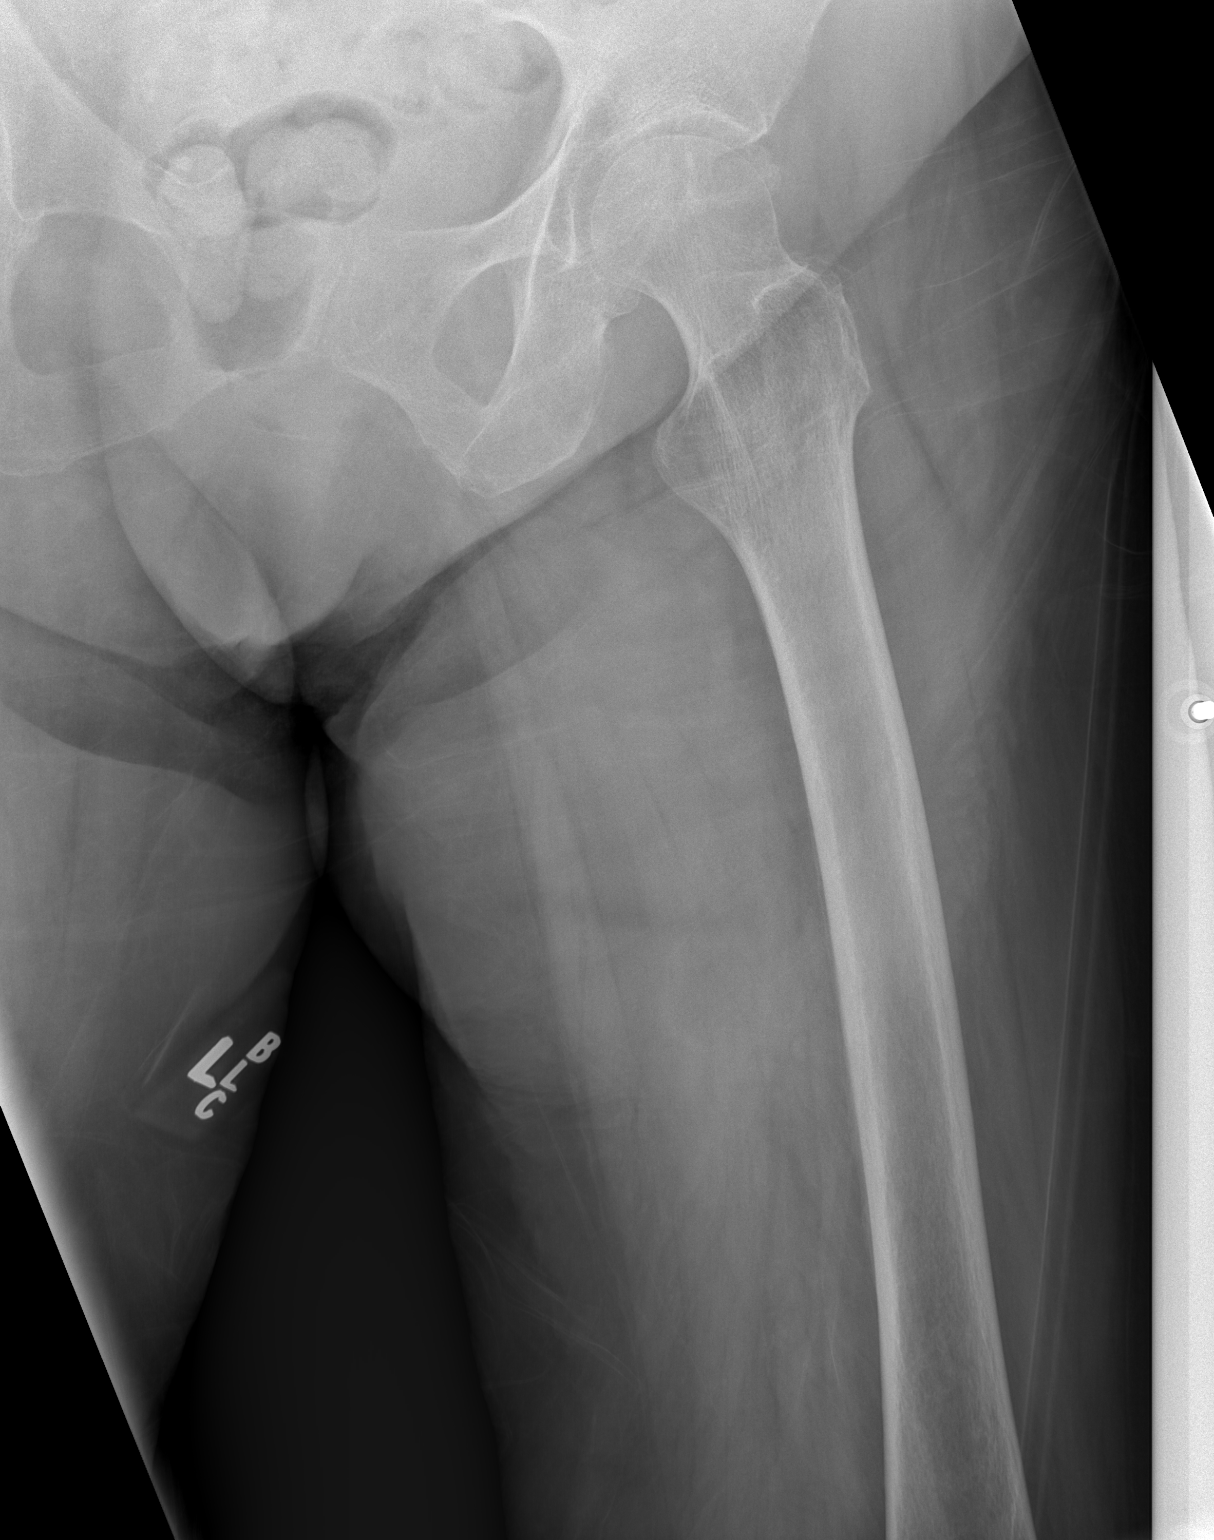

[3 of 3 positions shown; findings below may reference images not displayed]

FINDINGS: No acute fracture or dislocation.  No significant
degenerative changes.  No focal bony lesions or evidence of bony
destruction.  The bony pelvis is intact.  No soft tissue
abnormalities.
IMPRESSION: No acute findings.

## 2014-08-06 IMAGING — CT CT HIP*L* W/O CM
2 series · 14 of 32 positions shown, 19 images · non-contrast
Comparison: Left hip x-rays earlier same date.  Bone window images
from CT pelvis 08/09/2008.

2*RADIOLOGY REPORT*
CLINICAL DATA: Multiple recent falls.  Left hip injury.  Patient
unable to bear weight.  Negative x-rays earlier same date.

CT OF THE LEFT HIP WITHOUT CONTRAST
TECHNIQUE: Multidetector CT imaging was performed according to the
standard protocol. Multiplanar CT image reconstructions were also
generated.

[Series 4: hip st lt hip · axial · 0.51mm/px · z∈[-322,-147]mm · 7 of 47 slices shown, 12 images]
[im 6/47  soft-tissue]
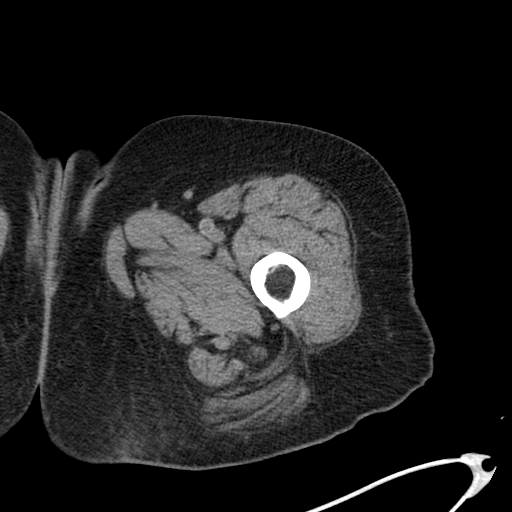
[im 6/47  bone]
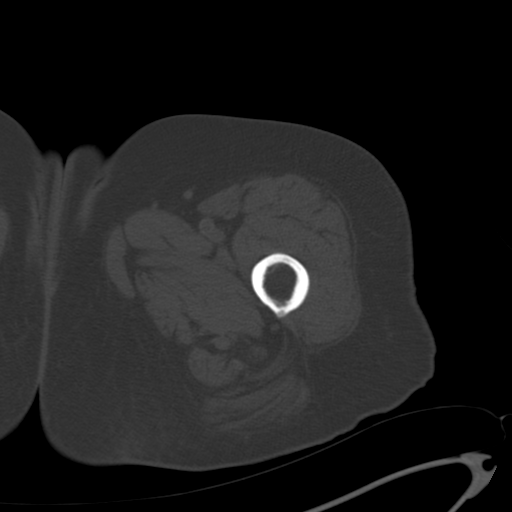
[im 12/47  soft-tissue]
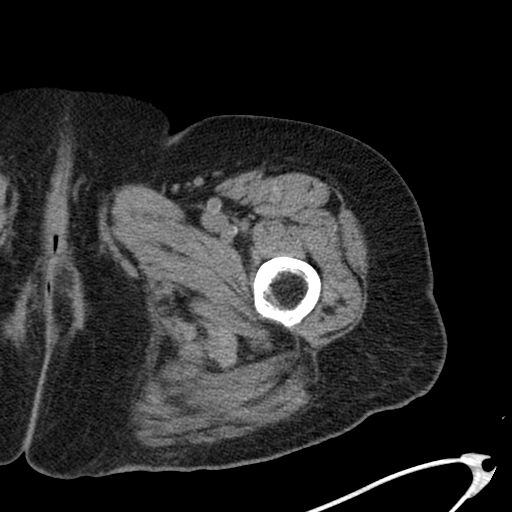
[im 18/47  soft-tissue]
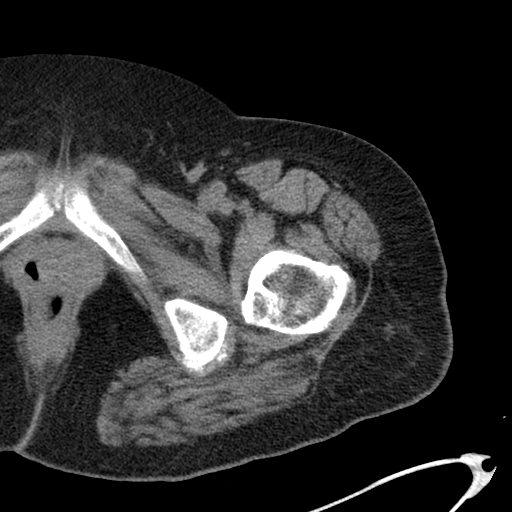
[im 24/47  soft-tissue]
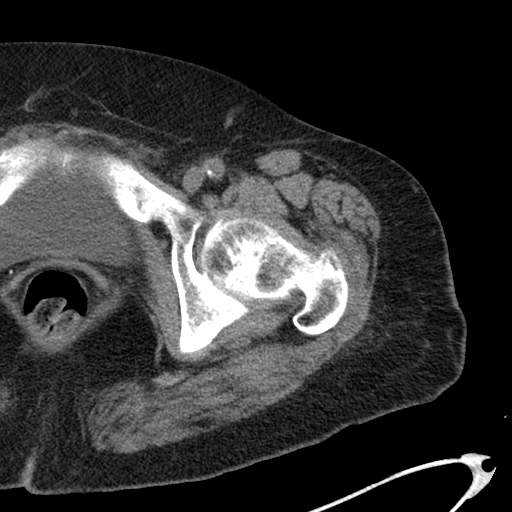
[im 24/47  lung]
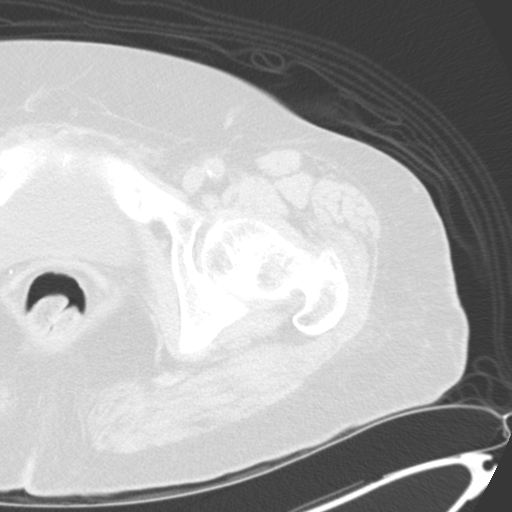
[im 29/47  soft-tissue]
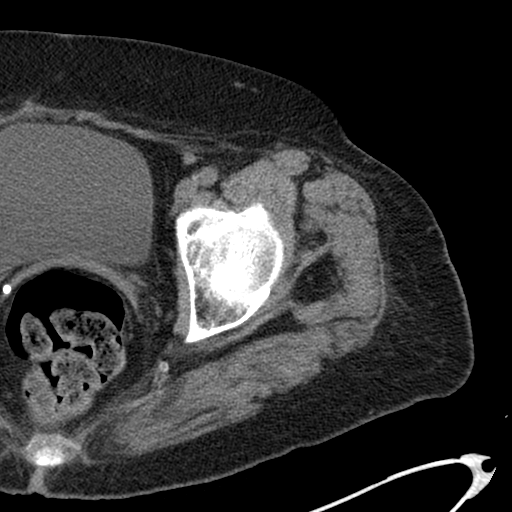
[im 29/47  lung]
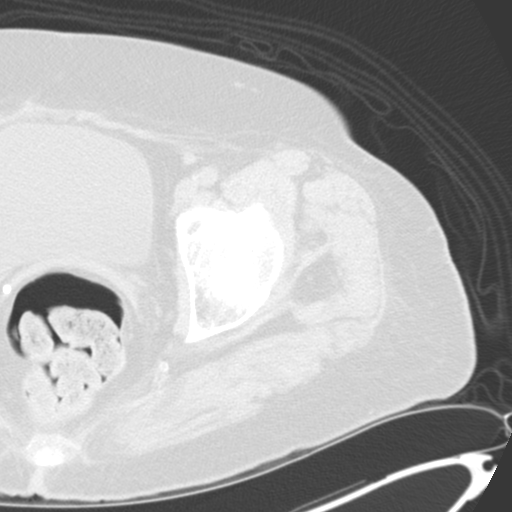
[im 35/47  soft-tissue]
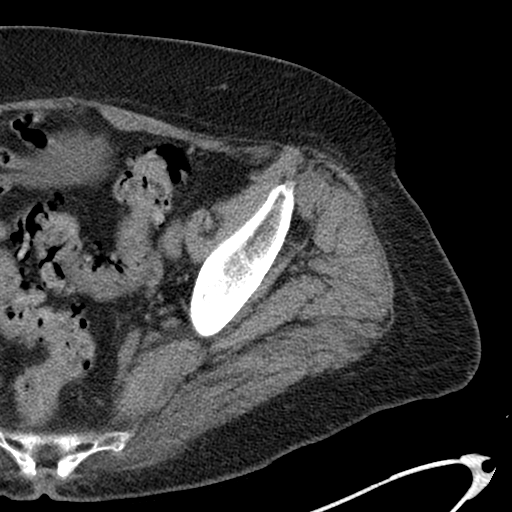
[im 35/47  lung]
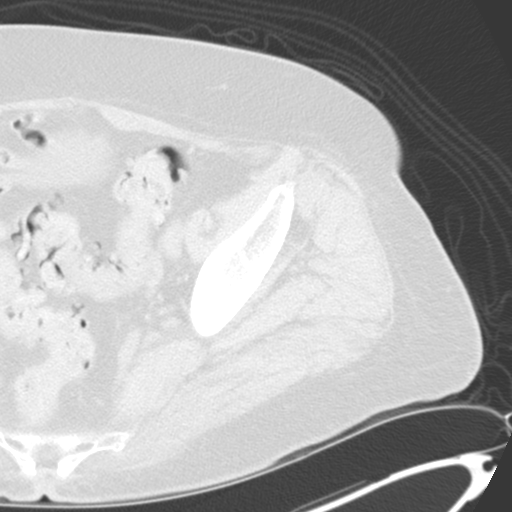
[im 41/47  soft-tissue]
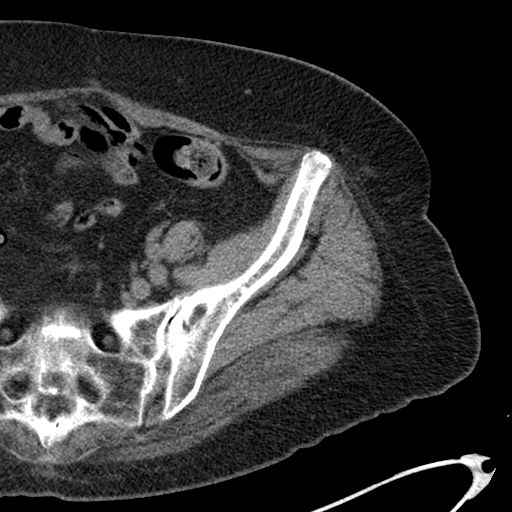
[im 41/47  lung]
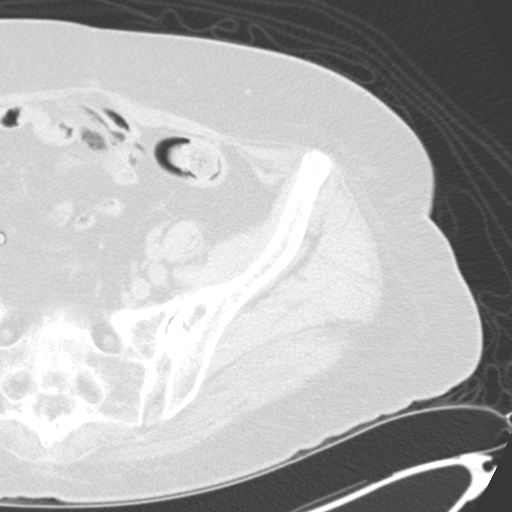

[Series 8: axial bone · axial · 0.47mm/px · z∈[-326,-160]mm · 7 of 115 slices shown]
[im 11/115  bone]
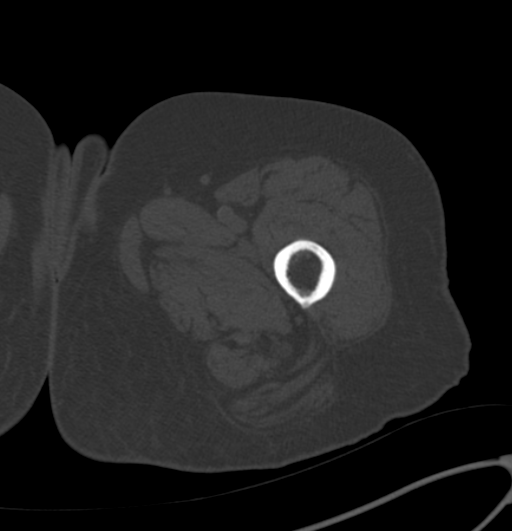
[im 21/115  bone]
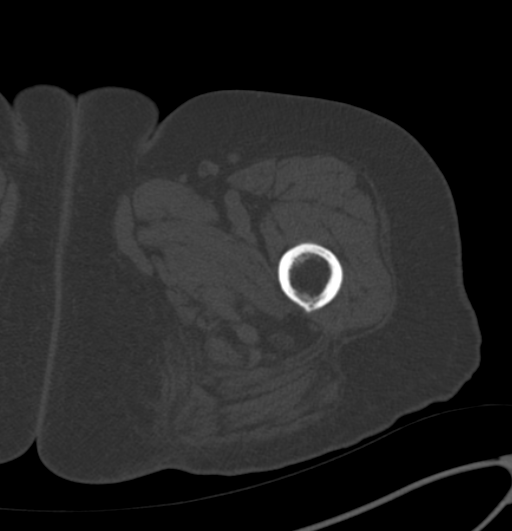
[im 37/115  bone]
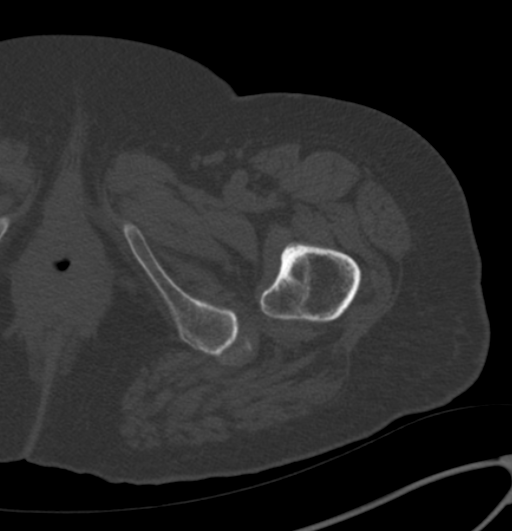
[im 52/115  bone]
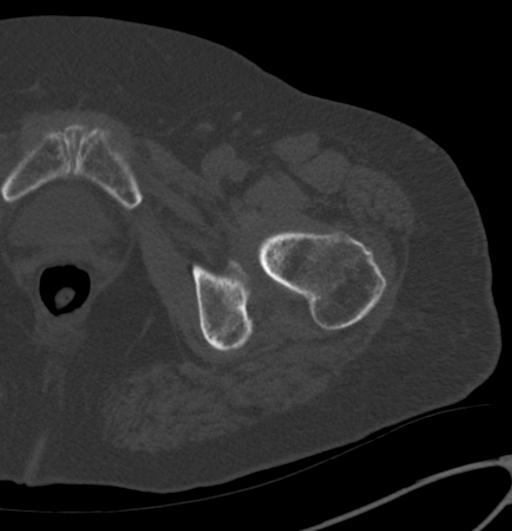
[im 63/115  bone]
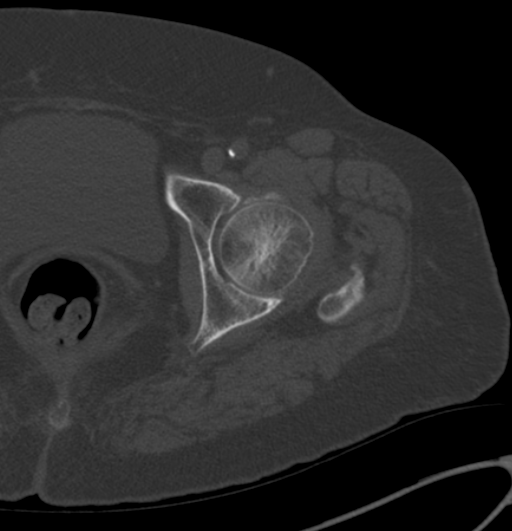
[im 78/115  bone]
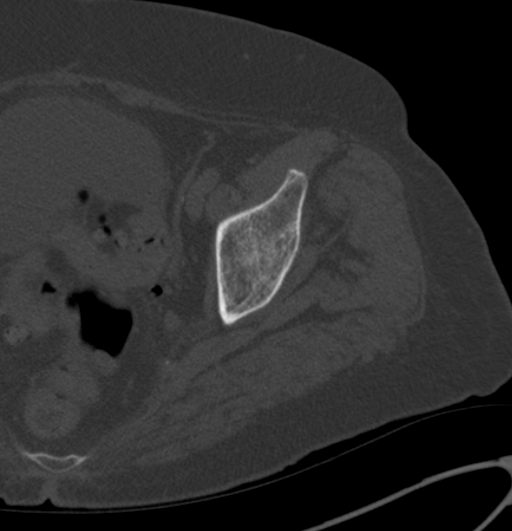
[im 94/115  bone]
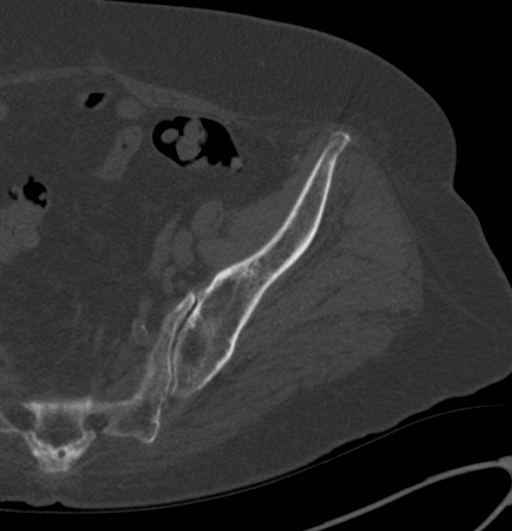

[14 of 32 positions shown; findings below may reference images not displayed]

FINDINGS: Calcification immediately anterior and lateral to the
left femoral head, consistent with chondrocalcinosis in the
acetabular labrum.  No evidence of acute or subacute fracture
involving the left proximal femur or the left hemipelvis.
Generalized osseous demineralization.  Moderate degenerative
changes in the left sacroiliac joint.  Degenerative changes with
chondrocalcinosis in the symphysis pubis.  Chondrocalcinosis at the
insertion of the hamstring tendon on the ischium.  Moderate joint
space narrowing in the left hip.

Soft tissue window images demonstrate extensive sigmoid colon
diverticulosis and mild iliofemoral atherosclerosis.
IMPRESSION: 1.  No acute fractures involving the left proximal femur or the
left hemipelvis.
2. CPPD, with chondrocalcinosis involving the acetabular labrum,
the symphysis pubis, and the hamstring tendon.
3.  Moderate osteoarthritis in the left hip.

The acetabular chondrocalcinosis might account for the patient's
pain.

## 2014-09-17 ENCOUNTER — Encounter: Payer: Self-pay | Admitting: *Deleted

## 2014-11-03 DEATH — deceased

## 2014-11-12 ENCOUNTER — Encounter (HOSPITAL_COMMUNITY): Payer: Self-pay | Admitting: Internal Medicine

## 2019-08-05 DEATH — deceased
